# Patient Record
Sex: Male | Born: 1970 | Race: White | Hispanic: No | Marital: Single | Smoking: Current some day smoker
Health system: Southern US, Community
[De-identification: ages and names within clinical notes are randomized; demographics above are authoritative.]

## PROBLEM LIST (undated history)

## (undated) DIAGNOSIS — K909 Intestinal malabsorption, unspecified: Secondary | ICD-10-CM

## (undated) DIAGNOSIS — C801 Malignant (primary) neoplasm, unspecified: Secondary | ICD-10-CM

## (undated) DIAGNOSIS — C069 Malignant neoplasm of mouth, unspecified: Secondary | ICD-10-CM

## (undated) DIAGNOSIS — D509 Iron deficiency anemia, unspecified: Secondary | ICD-10-CM

## (undated) HISTORY — DX: Intestinal malabsorption, unspecified: K90.9

## (undated) HISTORY — PX: OTHER SURGICAL HISTORY: SHX169

## (undated) HISTORY — DX: Iron deficiency anemia, unspecified: D50.9

## (undated) HISTORY — PX: SP PERC PLACE GASTRIC TUBE: HXRAD333

---

## 2013-05-14 DIAGNOSIS — C801 Malignant (primary) neoplasm, unspecified: Secondary | ICD-10-CM

## 2013-05-14 HISTORY — DX: Malignant (primary) neoplasm, unspecified: C80.1

## 2014-08-04 ENCOUNTER — Encounter (HOSPITAL_COMMUNITY): Payer: Self-pay | Admitting: Emergency Medicine

## 2014-08-04 ENCOUNTER — Emergency Department (HOSPITAL_COMMUNITY)
Admission: EM | Admit: 2014-08-04 | Discharge: 2014-08-04 | Disposition: A | Discharge status: Home / Self Care | Payer: Self-pay | Attending: Emergency Medicine | Admitting: Emergency Medicine

## 2014-08-04 DIAGNOSIS — K088 Other specified disorders of teeth and supporting structures: Secondary | ICD-10-CM | POA: Insufficient documentation

## 2014-08-04 DIAGNOSIS — Z85819 Personal history of malignant neoplasm of unspecified site of lip, oral cavity, and pharynx: Secondary | ICD-10-CM

## 2014-08-04 DIAGNOSIS — G8929 Other chronic pain: Secondary | ICD-10-CM | POA: Insufficient documentation

## 2014-08-04 DIAGNOSIS — Z590 Homelessness: Secondary | ICD-10-CM | POA: Insufficient documentation

## 2014-08-04 DIAGNOSIS — Z720 Tobacco use: Secondary | ICD-10-CM | POA: Insufficient documentation

## 2014-08-04 DIAGNOSIS — K1379 Other lesions of oral mucosa: Secondary | ICD-10-CM | POA: Insufficient documentation

## 2014-08-04 DIAGNOSIS — Z8589 Personal history of malignant neoplasm of other organs and systems: Secondary | ICD-10-CM | POA: Insufficient documentation

## 2014-08-04 HISTORY — DX: Malignant (primary) neoplasm, unspecified: C80.1

## 2014-08-04 MED ORDER — LIDOCAINE VISCOUS 2 % MT SOLN: 20.0000 mL | OROMUCOSAL | Status: DC | PRN

## 2014-08-04 MED ORDER — OXYCODONE-ACETAMINOPHEN 5-325 MG PO TABS
2.0000 | ORAL_TABLET | Freq: Once | ORAL | Status: AC
  Administered 2014-08-04: 2 via ORAL
  Filled 2014-08-04: qty 2

## 2014-08-04 NOTE — ED Provider Notes (Signed)
CSN: [id number]     Arrival date & time 08/04/14  1429 History  This chart was scribed for Domenic Moras, PA-C working with Charlesetta Shanks, MD by Mercy Moore, ED Scribe. This patient was seen in room TR09C/TR09C and the patient's care was started at 4:10 PM.   Chief Complaint  Patient presents with  . Dental Pain    The history is provided by the patient. No language interpreter was used.  HPI Comments: Derek Campos is a 44 y.o. male who presents to the Emergency Department complaining of constant chronic oral ulcer and dental pain. 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 Patient reports that since radiation he has been experiencing dental and oral pain which his doctor warned him of. Patient reports exhausting his pain medication 07/15/2014: Oxycodone 10mg . Patient reports attempted treatment with ibuprofen since, but denies relief of his pain. Patient received his treatment in [place]. Patient shares that due to his bout with cancer he's become homeless. Patient expresses a desire to establish care here in [place]. Patient's other medication includes lodocaine viscous solution and Gengigel.  He is here for his chronic mouth pain.  No fever, or other infectious symptoms. He is a smoker.   Past Medical History  Diagnosis Date  . 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    Past Surgical History  Procedure Laterality Date  . J peg     No family history on file. History  Substance Use Topics  . Smoking status: Current Every Day Smoker  . Smokeless tobacco: Not on file  . Alcohol Use: Yes    Review of Systems  Constitutional: Negative for fever and chills.  HENT: Positive for dental problem and mouth sores. Negative for trouble swallowing.       Allergies  Review of patient's allergies indicates no known allergies.  Home Medications   Prior to Admission medications   Not on  File   Triage Vitals: BP 102/74 mmHg  Pulse 80  Temp(Src) 97.7 F (36.5 C) (Oral)  Resp 18  Ht 5\' 9"  (1.753 m)  Wt 125 lb (56.7 kg)  BMI 18.45 kg/m2  SpO2 97% Physical Exam  Constitutional: He is oriented to person, place, and time. He appears well-developed and well-nourished. No distress.  HENT:  Head: Normocephalic and atraumatic.  dystrophic appearing tongue with approximately 20% tissue loss to L proximal tongue.  crescent shaped ulcer to the lateral aspects on the right proximal tongue, ttp.  Generalized tenderness throughout all teeth without intrusion/extrusion. No trismus or obvious dental decay or abscess.   Eyes: EOM are normal.  Neck: Neck supple. No tracheal deviation present.  Cardiovascular: Normal rate, regular rhythm and normal heart sounds.   Pulmonary/Chest: Effort normal and breath sounds normal. No respiratory distress.  Abdominal: Soft. There is no tenderness.  Musculoskeletal: Normal range of motion.  Lymphadenopathy:    He has no cervical adenopathy.  Neurological: He is alert and oriented to person, place, and time.  Skin: Skin is warm and dry.  Psychiatric: He has a normal mood and affect. His behavior is normal.  Nursing note and vitals reviewed.   ED Course  Procedures (including critical care time)  COORDINATION OF CARE: 4:10 PM- Hx of mouth cancer and tongue cancer now with chronic mouth pain s/p radiation. No evidence of dental abscess or evidence of thrush. No other infectious finding. Pt will benefit from pain management  center and to f/u with oncologist.  Patient informed that long term narcotics can not be prescribed from the ED. Discussed treatment plan with patient at bedside and patient agreed to plan.   Labs Review Labs Reviewed - No data to display  Imaging Review No results found.   EKG Interpretation None      MDM   Final diagnoses:  History of mouth cancer  Mouth pain    BP 102/74 mmHg  Pulse 80  Temp(Src) 97.7 F  (36.5 C) (Oral)  Resp 18  Ht 5\' 9"  (1.753 m)  Wt 125 lb (56.7 kg)  BMI 18.45 kg/m2  SpO2 97%   I personally performed the services described in this documentation, which was scribed in my presence. The recorded information has been reviewed and is accurate.     Domenic Moras, PA-C 08/04/14 [place], PA-C 08/04/14 1651  Charlesetta Shanks, MD 08/04/14 2137

## 2014-08-04 NOTE — Discharge Instructions (Signed)
Please use resources below to find a primary care provider and eventually an oncologist for further management of your condition.     Emergency Department Resource Guide 1) Find a Doctor and Pay Out of Pocket Although you won't have to find out who is covered by your insurance plan, it is a good idea to ask around and get recommendations. You will then need to call the office and see if the doctor you have chosen will accept you as a new patient and what types of options they offer for patients who are self-pay. Some doctors offer discounts or will set up payment plans for their patients who do not have insurance, but you will need to ask so you aren't surprised when you get to your appointment.  2) Contact Your Local Health Department Not all health departments have doctors that can see patients for sick visits, but many do, so it is worth a call to see if yours does. If you don't know where your local health department is, you can check in your phone book. The CDC also has a tool to help you locate your state's health department, and many state websites also have listings of all of their local health departments.  3) Find a [facility] If your illness is not likely to be very severe or complicated, you may want to try a walk in clinic. These are popping up all over the country in pharmacies, drugstores, and shopping centers. They're usually staffed by nurse practitioners or physician assistants that have been trained to treat common illnesses and complaints. They're usually fairly quick and inexpensive. However, if you have serious medical issues or chronic medical problems, these are probably not your best option.  No Primary Care Doctor: - Call Health Connect at  [phone] - they can help you locate a primary care doctor that  accepts your insurance, provides certain services, etc. - Physician Referral Service- [phone]  Chronic Pain Problems: Organization         Address  Phone    Notes  [facility]  [phone] Patients need to be referred by their primary care doctor.   Medication Assistance: Organization         Address  Phone   Notes  [facility] Medication [facility] [address] [place], [place] [address] [phone] --Must be a resident of [facility] -- Must have NO insurance coverage whatsoever (no Medicaid/ Medicare, etc.) -- The pt. MUST have a primary care doctor that directs their care regularly and follows them in the community   MedAssist  [phone]   [employer]  [phone]    Agencies that provide inexpensive medical care: Organization         Address  Phone   Notes  [place]  [phone]   [facility]    [phone]   [facility] [place], [place] [address] [phone]   [place] [address], [place] [phone]   Planned Parenthood    [phone]   [facility]    [phone]   [place] and [place] [address], [place] Phone:  [phone], Fax:  [phone] Hours of Operation:  9 am - 6 pm, M-F.  Also accepts Medicaid/Medicare and self-pay.  [facility] for [place], [address], [place] Phone: [phone],  Fax: [phone]. Hours of Operation:  8:30 am - 5:30 pm, M-F.  Also accepts Medicaid and self-pay.  [facility] [address], [place] Phone: [phone]   [place], [place], [place] [phone], Ext. [phone] Mondays & Thursdays: 7-9 AM.  First 15 patients are seen on a first come, first serve basis.    [place] Providers:  Organization         Address  Phone   Notes  [facility] [address],  [phone] Also accepts self-pay patients.  [facility]  [address], [place]  [phone]   [place], [address], [place] [phone]   [facility] [address], [place] [phone]   Lucianne Lei [address], [place]   [phone] Only accepts [place] Access [place] patients after they have their name applied to their card.   Self-Pay (no insurance) in [facility]:  Organization         Address  Phone   Notes  Sickle Cell Patients, [facility] [place] [phone]   [facility] [place] [phone]   [facility] [place]  [place], [place], [place] [phone]   [facility]/Dr. Osei-Bonsu  [address], [place] or [address], [place] [phone] Phone number for both [place] and [place] locations is the same.  Urgent Medical and [facility] [address], [place] [phone]   [facility] [address] [place] or [address] [phone]   [facility] [address] [place] [phone], phone; [phone], fax Sees patients 1st and 3rd Saturday of every month.  Must not qualify for public or private insurance (i.e. Medicaid, Medicare, [place] Health Choice, Veterans' Benefits)  Household income should be no more than 200% of the poverty level The clinic cannot treat you if you are pregnant or think you are pregnant  Sexually transmitted diseases are not treated at the clinic.    Dental Care: Organization         Address  Phone  Notes  [facility] Department of [facility] [place] [phone] Accepts children up to age 22 who are enrolled in [place] or [place]; pregnant women with a Medicaid card; and children who have  applied for Medicaid or [place] Health Choice, but were declined, whose parents can pay a reduced fee at time of service.  [facility] Department of [facility]  [address], [place] [phone] Accepts children up to age 33 who are enrolled in [place] or [place]; pregnant women with a Medicaid card; and children who have applied for Medicaid or [place] Health Choice, but were declined, whose parents can pay a reduced fee at time of service.  [place] Adult Dental Access PROGRAM  [place] [phone] Patients are seen by appointment only. Walk-ins are not accepted. [place] will see patients 13 years of age and older. Monday - Tuesday (8am-5pm) Most Wednesdays (8:30-5pm) $30 per visit, cash only  [place] Adult [employer] PROGRAM  [address], [employer] (  [phone] Patients are seen by appointment only. Walk-ins are not accepted. [place] will see patients 46 years of age and older. One Wednesday Evening (Monthly: Volunteer Based).  $30 per visit, cash only  [place]  [phone] for adults; Children under age 38, call Graduate Pediatric Dentistry at [phone]. Children aged 28-14, please call [phone] to request a pediatric application.  Dental services are provided in all areas of dental care including fillings, crowns and bridges, complete and partial dentures, implants, gum treatment, root canals, and extractions. Preventive care is also provided. Treatment is provided to both adults and children. Patients are selected via a lottery and there is often a waiting list.   [facility] [address] [place]  [phone] [email or web address]   [facility] [address] [place], [place] [phone], Ext. [phone] Second and Fourth Thursday of each month, opens at 6:30 AM; Clinic ends at 9 AM.  Patients are seen on a first-come first-served basis, and a  limited number are seen during each clinic.   [facility]  [address] Hill[place] Danker [place], [place] [phone]   Eligibility Requirements You must have lived in [place], [place], or [place] counties for at least the last three months.   You cannot be eligible for state or federal sponsored [employer], including [employer], [place], or [employer].   You generally cannot be eligible for healthcare insurance through your employer.    How to apply: Eligibility screenings are held every Tuesday and Wednesday afternoon from 1:00 pm until 4:00 pm. You do not need an appointment for the interview!  [facility] [address], [place], [place]   [place]  [place] Department  [place]  [phone]    Behavioral Health Resources in the Community: Intensive Outpatient Programs Organization         Address  Phone  Notes  [place]. [address], [place], [place] [phone]   [facility] Outpatient [address] [place], [place]   ADS: Alcohol & Drug Svcs [address] [place], [place]   [place] [address],  [place], [place] or [phone]   Substance Abuse Resources Organization         Address  Phone  Notes  Alcohol and Drug Services  [phone]   [place]  [phone]   [facility] [place]   Chinita Pester  [phone]   Residential & Outpatient Substance Abuse Program  [phone]   Psychological Services Organization         Address  Phone  Notes  [facility]  [place]  [phone]   [place] [address] [place] or [phone]    Mobile Crisis Teams Organization          Address  Phone  Notes  [facility], Mobile Crisis Care Unit  [phone]   [facility]  [address] [place], [place]   [place] [address], [place] [phone]    Self-Help/Support Groups Organization         Address  Phone             Notes  [place]. of [place] - variety of support groups  336- H3156881 Call for more information  Narcotics Anonymous (NA), [facility] [address], [employer]  2 meetings at this location   Residential Facilities manager         Address  Phone  Notes  [facility] [place],    [place]  [phone]   [facility]  [address], [place] [id number], [place], [place]   [place], [place] [phone] Admissions: 8am-3pm M-F  Incentives Substance [place] [address]    [employer], [place] [phone]   [facility] [address] [place], [place], [place]   [facility] [address],  [place], [place]   [facility] Programs - Intensive Outpatient [address], [place], [place]   [facility] ([place].,  [place], [place] [phone]-([phone] or [phone]   [facility] Services (RTS) [address], [place], [place] Accepts Medicaid  [facility] [place] [address]  [place] [phone] Substance Abuse/Addiction Treatment   [facility] Organization         Address  Phone  Notes  [facility]  [phone]   Domenic Schwab, PhD [address] Arlis Porta [place], [place]   [phone] or [phone]   [place], [place] [phone]   [facility] [address], [place], [place] [phone] Insurance/Medicaid/sponsorship  through [facility] and Families [address]                                    [place], [place] [phone] [place] [address], [place] [phone]    Dr. Adele Schilder  [phone]   [facility] Dept. 1) [address] [place] 2) [place] 3)  [place] [address], [place] [phone]  [phone]   [place] [phone] or [phone] (After Hours)

## 2014-08-04 NOTE — ED Notes (Signed)
Pt is in stable condition upon d/c and ambulates escorted by staff from ED.

## 2014-08-04 NOTE — ED Notes (Signed)
Pt sts he was having radiation and chemo for cancer and now has ulcers in mouth that are extremely painful. Pt recently moved here and needs help with his medications.

## 2014-08-04 NOTE — ED Notes (Signed)
Debacterol placed on canker sore.

## 2014-08-11 ENCOUNTER — Ambulatory Visit: Payer: No Typology Code available for payment source | Attending: Family Medicine | Admitting: Family Medicine

## 2014-08-11 ENCOUNTER — Encounter: Payer: Self-pay | Admitting: Family Medicine

## 2014-08-11 VITALS — BP 117/78 | HR 78 | Temp 97.5°F | Resp 16 | Ht 69.0 in | Wt 131.4 lb

## 2014-08-11 DIAGNOSIS — F17200 Nicotine dependence, unspecified, uncomplicated: Secondary | ICD-10-CM

## 2014-08-11 DIAGNOSIS — G893 Neoplasm related pain (acute) (chronic): Secondary | ICD-10-CM | POA: Insufficient documentation

## 2014-08-11 DIAGNOSIS — Z808 Family history of malignant neoplasm of other organs or systems: Secondary | ICD-10-CM | POA: Insufficient documentation

## 2014-08-11 DIAGNOSIS — F17210 Nicotine dependence, cigarettes, uncomplicated: Secondary | ICD-10-CM | POA: Insufficient documentation

## 2014-08-11 DIAGNOSIS — Z9221 Personal history of antineoplastic chemotherapy: Secondary | ICD-10-CM | POA: Insufficient documentation

## 2014-08-11 DIAGNOSIS — Z85810 Personal history of malignant neoplasm of tongue: Secondary | ICD-10-CM | POA: Insufficient documentation

## 2014-08-11 DIAGNOSIS — K1379 Other lesions of oral mucosa: Secondary | ICD-10-CM | POA: Insufficient documentation

## 2014-08-11 DIAGNOSIS — C069 Malignant neoplasm of mouth, unspecified: Secondary | ICD-10-CM

## 2014-08-11 DIAGNOSIS — Z923 Personal history of irradiation: Secondary | ICD-10-CM | POA: Insufficient documentation

## 2014-08-11 DIAGNOSIS — Z720 Tobacco use: Secondary | ICD-10-CM

## 2014-08-11 DIAGNOSIS — Z931 Gastrostomy status: Secondary | ICD-10-CM

## 2014-08-11 DIAGNOSIS — G894 Chronic pain syndrome: Secondary | ICD-10-CM

## 2014-08-11 MED ORDER — LIDOCAINE VISCOUS 2 % MT SOLN: 20.0000 mL | OROMUCOSAL | Status: DC | PRN

## 2014-08-11 MED ORDER — ACETAMINOPHEN-CODEINE #3 300-30 MG PO TABS: 1.0000 | ORAL_TABLET | Freq: Three times a day (TID) | ORAL | Status: DC | PRN

## 2014-08-11 MED ORDER — NICOTINE 14 MG/24HR TD PT24: 14.0000 mg | MEDICATED_PATCH | Freq: Every day | TRANSDERMAL | Status: DC

## 2014-08-11 MED ORDER — GABAPENTIN 300 MG PO CAPS: 600.0000 mg | ORAL_CAPSULE | Freq: Two times a day (BID) | ORAL | Status: DC

## 2014-08-11 NOTE — Progress Notes (Signed)
Patient here to establish care Has a history of squamous cell carcinoma of tongue Patient has no pain medications and is in extreme pain Patient reports the only medication he has been taking is 3600 mg of ibuprofen per day

## 2014-08-11 NOTE — Progress Notes (Signed)
Subjective:    Patient ID: Derek Campos, male    DOB: 07-11-1970, 44 y.o.   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 He has a PEG tube and has run out of tube feeds as he has difficulty swallowing and so he has been feeding on Carnation and liquids. He complains of persistent pain in the mouth and drools a lot and has been unable to maintain a job. He does have a family history of oral cancer in his dad while he was in his middle 58s. Unfortunately he continues to smoke about 3-5 sticks of cigarettes per day and is thinking of quitting.  He brings in the form for disability which she would like completed to certify that he is unable to work.  Past Medical History  Diagnosis Date  . 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    . No Known Allergies  No current outpatient prescriptions on file prior to visit.   No current facility-administered medications on file prior to visit.     Review of Systems  General: negative for fever, weight loss, appetite change Eyes: no visual symptoms. ENT: see HPI Neck: no pain  Respiratory: no wheezing, shortness of breath, cough Cardiovascular: no chest pain, no dyspnea on exertion, no pedal edema, no orthopnea. Gastrointestinal: no abdominal pain, no diarrhea, no constipation Genito-Urinary: no urinary frequency, no dysuria, no polyuria. Hematologic: no bruising Endocrine: no cold or heat intolerance Neurological: no headaches, no seizures, no tremors Musculoskeletal: no joint pains, no joint swelling Skin: no pruritus, no rash. Psychological: no depression, no anxiety,    Objective:  Filed Vitals:   08/11/14 1352  BP: 117/78  Pulse: 78  Temp: 97.5 F (36.4 C)  Resp: 16       Physical Exam  Constitutional: normal appearing,  Eyes: PERRLA HENT: Head is atraumatic, oropharynx is in keeping with radiation changes evidenced by distorted tongue and healed scar on let of the tongue Neck: normal range of motion, no thyromegaly, no JVD Lymphatic: submandibular lymphadenopathy on the right, fixed 3x2 cm, on the left 2x2cm. Cardiovascular: normal rate and rhythm, normal heart sounds, no murmurs, rub or gallop, no pedal edema Respiratory: clear to auscultation bilaterally, no wheezes, no rales, no rhonchi Abdomen: PEG tube in situ, surrounding skin is normal, soft, not tender to palpation, normal bowel sounds, no enlarged organs Extremities: Full ROM, no tenderness in joints Skin: warm and dry, no lesions. Psychological: normal mood.         Assessment & Plan:  44 year old male patient with a history of oral cancer status post radiation and chemotherapy with consideration of a PEG tube who continues to have constant pain in his mouth and no oncology follow-up at this time.  Oral cancer: I am referring him to the [facility] and will also request his old records from his previous physicians. He has been given a prescription for opioid analgesic as well as gabapentin and viscous lidocaine. He does have a PEG tube but due to the fact that he has no insurance obtaining she will think to be a major challenge and I have gotten the social worker to speak with him regarding options available and how to source  for disability and/or Medicaid.  Chronic pain: This is secondary to oral cancer. He was previously on oxycodone which have informed him we do not do here but I have given him a temporary supply of Tylenol No. 3 hopefully he should be able to get some opioid analgesics at [place].  Tobacco abuse: I have strongly advised him to quit and have commands nicotine replacement therapy and nicotine patches Smoking cessation support: smoking cessation  hotline: 1-800-QUIT-NOW.  Smoking cessation classes are available through [facility] and Vascular Center. Call ([phone] or visit our website at [email or web address]  Spent 4 counseling on smoking cessations and patient is ready to quit.

## 2014-08-11 NOTE — Patient Instructions (Signed)
Smoking Cessation Quitting smoking is important to your health and has many advantages. However, it is not always easy to quit since nicotine is a very addictive drug. Oftentimes, people try 3 times or more before being able to quit. This document explains the best ways for you to prepare to quit smoking. Quitting takes hard work and a lot of effort, but you can do it. ADVANTAGES OF QUITTING SMOKING  You will live longer, feel better, and live better.  Your body will feel the impact of quitting smoking almost immediately.  Within 20 minutes, blood pressure decreases. Your pulse returns to its normal level.  After 8 hours, carbon monoxide levels in the blood return to normal. Your oxygen level increases.  After 24 hours, the chance of having a heart attack starts to decrease. Your breath, hair, and body stop smelling like smoke.  After 48 hours, damaged nerve endings begin to recover. Your sense of taste and smell improve.  After 72 hours, the body is virtually free of nicotine. Your bronchial tubes relax and breathing becomes easier.  After 2 to 12 weeks, lungs can hold more air. Exercise becomes easier and circulation improves.  The risk of having a heart attack, stroke, cancer, or lung disease is greatly reduced.  After 1 year, the risk of coronary heart disease is cut in half.  After 5 years, the risk of stroke falls to the same as a nonsmoker.  After 10 years, the risk of lung cancer is cut in half and the risk of other cancers decreases significantly.  After 15 years, the risk of coronary heart disease drops, usually to the level of a nonsmoker.  If you are pregnant, quitting smoking will improve your chances of having a healthy baby.  The people you live with, especially any children, will be healthier.  You will have extra money to spend on things other than cigarettes. QUESTIONS TO THINK ABOUT BEFORE ATTEMPTING TO QUIT You may want to talk about your answers with your  health care provider.  Why do you want to quit?  If you tried to quit in the past, what helped and what did not?  What will be the most difficult situations for you after you quit? How will you plan to handle them?  Who can help you through the tough times? Your family? Friends? A health care provider?  What pleasures do you get from smoking? What ways can you still get pleasure if you quit? Here are some questions to ask your health care provider:  How can you help me to be successful at quitting?  What medicine do you think would be best for me and how should I take it?  What should I do if I need more help?  What is smoking withdrawal like? How can I get information on withdrawal? GET READY  Set a quit date.  Change your environment by getting rid of all cigarettes, ashtrays, matches, and lighters in your home, car, or work. Do not let people smoke in your home.  Review your past attempts to quit. Think about what worked and what did not. GET SUPPORT AND ENCOURAGEMENT You have a better chance of being successful if you have help. You can get support in many ways.  Tell your family, friends, and coworkers that you are going to quit and need their support. Ask them not to smoke around you.  Get individual, group, or telephone counseling and support. Programs are available at local hospitals and health centers. Call   your local health department for information about programs in your area.  Spiritual beliefs and practices may help some smokers quit.  Download a "quit meter" on your computer to keep track of quit statistics, such as how long you have gone without smoking, cigarettes not smoked, and money saved.  Get a self-help book about quitting smoking and staying off tobacco. LEARN NEW SKILLS AND BEHAVIORS  Distract yourself from urges to smoke. Talk to someone, go for a walk, or occupy your time with a task.  Change your normal routine. Take a different route to work.  Drink tea instead of coffee. Eat breakfast in a different place.  Reduce your stress. Take a hot bath, exercise, or read a book.  Plan something enjoyable to do every day. Reward yourself for not smoking.  Explore interactive web-based programs that specialize in helping you quit. GET MEDICINE AND USE IT CORRECTLY Medicines can help you stop smoking and decrease the urge to smoke. Combining medicine with the above behavioral methods and support can greatly increase your chances of successfully quitting smoking.  Nicotine replacement therapy helps deliver nicotine to your body without the negative effects and risks of smoking. Nicotine replacement therapy includes nicotine gum, lozenges, inhalers, nasal sprays, and skin patches. Some may be available over-the-counter and others require a prescription.  Antidepressant medicine helps people abstain from smoking, but how this works is unknown. This medicine is available by prescription.  Nicotinic receptor partial agonist medicine simulates the effect of nicotine in your brain. This medicine is available by prescription. Ask your health care provider for advice about which medicines to use and how to use them based on your health history. Your health care provider will tell you what side effects to look out for if you choose to be on a medicine or therapy. Carefully read the information on the package. Do not use any other product containing nicotine while using a nicotine replacement product.  RELAPSE OR DIFFICULT SITUATIONS Most relapses occur within the first 3 months after quitting. Do not be discouraged if you start smoking again. Remember, most people try several times before finally quitting. You may have symptoms of withdrawal because your body is used to nicotine. You may crave cigarettes, be irritable, feel very hungry, cough often, get headaches, or have difficulty concentrating. The withdrawal symptoms are only temporary. They are strongest  when you first quit, but they will go away within 10-14 days. To reduce the chances of relapse, try to:  Avoid drinking alcohol. Drinking lowers your chances of successfully quitting.  Reduce the amount of caffeine you consume. Once you quit smoking, the amount of caffeine in your body increases and can give you symptoms, such as a rapid heartbeat, sweating, and anxiety.  Avoid smokers because they can make you want to smoke.  Do not let weight gain distract you. Many smokers will gain weight when they quit, usually less than 10 pounds. Eat a healthy diet and stay active. You can always lose the weight gained after you quit.  Find ways to improve your mood other than smoking. FOR MORE INFORMATION  www.smokefree.gov  Document Released: 04/24/2001 Document Revised: 09/14/2013 Document Reviewed: 08/09/2011 [employer] Patient Information 2015 [employer]. This information is not intended to replace advice given to you by your health care provider. Make sure you discuss any questions you have with your health care provider.  

## 2014-08-12 ENCOUNTER — Telehealth: Payer: Self-pay | Admitting: Hematology and Oncology

## 2014-08-12 NOTE — Telephone Encounter (Signed)
Called referring office regarding this patient's referral.  Referral coordinators are not in this week.  Left msg.    Dx: Oral Cancer Referring: Dr. Jarold Song  I also had Dr. Alvy Bimler review this referral and she states that she does not feel that this is an urgent referral.  Her first appt is not until 05/05 and she stated if they wanted him to be seen sooner he would have to see someone else.

## 2014-08-16 ENCOUNTER — Telehealth: Payer: Self-pay | Admitting: Hematology and Oncology

## 2014-08-16 ENCOUNTER — Other Ambulatory Visit: Payer: Self-pay | Admitting: Family Medicine

## 2014-08-16 ENCOUNTER — Telehealth: Payer: Self-pay | Admitting: Hematology

## 2014-08-16 NOTE — Telephone Encounter (Signed)
NEW PATIENT APPT-S/W PATIENT AND GAVE NP APPT 04/21 @ 10:30 W/DR. FENG.  REFERRAL WAS FORWARDED TO DR. Alvy Bimler APPT D/T WAS FOR 05/05 @ 1. CALLED PATIENT TO GIVE APPT PATIENT REQUESTED AM SOONER APPT. MEDICAL RECORDS HAVE BEEN REQUESTED FROM [place] [phone] DR. BOATWRIGHT

## 2014-08-16 NOTE — Telephone Encounter (Signed)
NEW PATIENT APPT-LEFT MESSAGE FOR PATIENT TO RETURN CALL TO SCHEDULE NP APPT

## 2014-08-20 ENCOUNTER — Encounter (HOSPITAL_COMMUNITY): Payer: Self-pay | Admitting: *Deleted

## 2014-08-20 ENCOUNTER — Emergency Department (HOSPITAL_COMMUNITY)
Admission: EM | Admit: 2014-08-20 | Discharge: 2014-08-20 | Disposition: A | Discharge status: Home / Self Care | Payer: Medicaid Other | Attending: Emergency Medicine | Admitting: Emergency Medicine

## 2014-08-20 DIAGNOSIS — M542 Cervicalgia: Secondary | ICD-10-CM | POA: Insufficient documentation

## 2014-08-20 DIAGNOSIS — K146 Glossodynia: Secondary | ICD-10-CM | POA: Insufficient documentation

## 2014-08-20 DIAGNOSIS — K1370 Unspecified lesions of oral mucosa: Secondary | ICD-10-CM | POA: Insufficient documentation

## 2014-08-20 DIAGNOSIS — Z720 Tobacco use: Secondary | ICD-10-CM | POA: Insufficient documentation

## 2014-08-20 DIAGNOSIS — Z85828 Personal history of other malignant neoplasm of skin: Secondary | ICD-10-CM | POA: Insufficient documentation

## 2014-08-20 DIAGNOSIS — Z8589 Personal history of malignant neoplasm of other organs and systems: Secondary | ICD-10-CM

## 2014-08-20 DIAGNOSIS — Z79899 Other long term (current) drug therapy: Secondary | ICD-10-CM | POA: Insufficient documentation

## 2014-08-20 MED ORDER — LIDOCAINE VISCOUS 2 % MT SOLN
15.0000 mL | Freq: Once | OROMUCOSAL | Status: AC
  Administered 2014-08-20: 15 mL via OROMUCOSAL
  Filled 2014-08-20: qty 15

## 2014-08-20 MED ORDER — OXYCODONE-ACETAMINOPHEN 5-325 MG PO TABS: 1.0000 | ORAL_TABLET | Freq: Four times a day (QID) | ORAL | Status: DC | PRN

## 2014-08-20 MED ORDER — MAGIC MOUTHWASH
5.0000 mL | Freq: Once | ORAL | Status: AC
  Administered 2014-08-20: 5 mL via ORAL
  Filled 2014-08-20: qty 5

## 2014-08-20 MED ORDER — MAGIC MOUTHWASH W/LIDOCAINE: 10.0000 mL | Freq: Three times a day (TID) | ORAL | Status: DC

## 2014-08-20 NOTE — ED Provider Notes (Signed)
CSN: [id number]     Arrival date & time 08/20/14  1649 History   First MD Initiated Contact with Patient 08/20/14 1802     Chief Complaint  Patient presents with  . Mouth Lesions     (Consider location/radiation/quality/duration/timing/severity/associated sxs/prior Treatment) HPI Comments: Derek Campos is a 44 y.o. 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 He reports the pain is 10/10 constant sharp pain radiating around his mouth, worse with touching and eating, improved with ice cream and room temperature liquids, and unrelieved with Tylenol 3 and Orajel. He was previously on OxyContin 10 mg and 600 mg of gabapentin but since moving here from [facility] he has not had these refilled. He reports associated neck pain on the underside of his right jaw. He states he feels that this is a recurrence of his oral cancer. He has an appointment with Dr. Burr Medico of oncology on 09/02/14. He denies any fevers, chills, chest pain, shortness breath, abdominal pain, nausea, vomiting, diarrhea, constipation, melena, hematochezia, dysuria, hematuria, numbness, tingling, weakness, dental pain, oral drainage, gum swelling, trismus, or sore throat.  Patient is a 44 y.o. male presenting with mouth sores. The history is provided by the patient. No language interpreter was used.  Mouth Lesions Location:  Tongue Quality:  White Onset quality:  Gradual Severity:  Severe Duration:  2 weeks Progression:  Worsening Chronicity:  Recurrent Relieved by: room temperature liquids. Worsened by:  Eating and drinking Ineffective treatments:  Topical medications and prescription drugs (orajel and tylenol #3) Associated symptoms: neck pain   Associated symptoms: no dental pain, no ear pain, no fever, no rash  and no sore throat     Past Medical History  Diagnosis Date  . Cancer     squamous cell carcinoma on tongue   Past Surgical History  Procedure Laterality Date  . J peg     Family History  Problem Relation Age of Onset  . Cancer Father   . Alcohol abuse Father    History  Substance Use Topics  . Smoking status: Current Every Day Smoker -- 0.25 packs/day  . Smokeless tobacco: Not on file  . Alcohol Use: No    Review of Systems  Constitutional: Negative for fever and chills.  HENT: Positive for mouth sores. Negative for dental problem, ear pain, facial swelling, sore throat and trouble swallowing.   Respiratory: Negative for shortness of breath.   Cardiovascular: Negative for chest pain.  Gastrointestinal: Negative for nausea, vomiting, abdominal pain, diarrhea, constipation and blood in stool.  Genitourinary: Negative for dysuria and hematuria.  Musculoskeletal: Positive for neck pain. Negative for myalgias and arthralgias.  Skin: Negative for rash.  Neurological: Negative for weakness and numbness.  Psychiatric/Behavioral: Negative for confusion.   10 Systems reviewed and are negative for acute change except as noted in the HPI.    Allergies  Review of patient's allergies indicates no known allergies.  Home Medications   Prior to Admission medications   Medication Sig Start Date End Date Taking? Authorizing Provider  acetaminophen-codeine (TYLENOL #3) 300-30 MG per tablet Take 1 tablet by mouth every 8 (eight) hours as needed for moderate pain. 08/11/14   Arnoldo Morale, MD  gabapentin (NEURONTIN) 300 MG capsule Take 2 capsules (600 mg total) by mouth 2 (two) times  daily. 08/11/14   Arnoldo Morale, MD  ibuprofen (ADVIL,MOTRIN) 200 MG tablet Take 200 mg by mouth every 6 (six) hours as needed.    Historical Provider, MD  lidocaine (XYLOCAINE) 2 % solution Use as directed 20 mLs in the mouth or throat as needed for mouth pain. 08/11/14   Arnoldo Morale, MD  nicotine (NICODERM CQ  - DOSED IN MG/24 HOURS) 14 mg/24hr patch Place 1 patch (14 mg total) onto the skin daily. For 6 weeks, then apply 7mg /24 hr for 2 weeks 08/11/14   Arnoldo Morale, MD   BP 107/68 mmHg  Pulse 87  Temp(Src) 98 F (36.7 C) (Axillary)  Resp 20  Ht 5\' 9"  (1.753 m)  Wt 136 lb (61.689 kg)  BMI 20.07 kg/m2  SpO2 100%   Physical Exam  Constitutional: He is oriented to person, place, and time. Vital signs are normal. He appears well-developed and well-nourished.  Non-toxic appearance. No distress.  Afebrile, nontoxic, NAD  HENT:  Head: Normocephalic and atraumatic.  Mouth/Throat: Uvula is midline, oropharynx is clear and moist and mucous membranes are normal. Oral lesions present. No trismus in the jaw. Normal dentition. No dental abscesses, uvula swelling or dental caries.    Dystrophic tongue with ~1/3 of L lateral tongue resected, with entire lateral portion of remaining tongue ulcerated with a whiteish film which is exquisitely TTP, no bleeding, unable to scrape off any films. No dentitial tenderness or abnormalities to gum line. No trismus or uvular swelling. No dental abscess, airway patent  Eyes: Conjunctivae and EOM are normal. Right eye exhibits no discharge. Left eye exhibits no discharge.  Neck: Normal range of motion. Neck supple.  Cardiovascular: Normal rate, regular rhythm, normal heart sounds and intact distal pulses.  Exam reveals no gallop and no friction rub.   No murmur heard. Pulmonary/Chest: Effort normal and breath sounds normal. No respiratory distress. He has no decreased breath sounds. He has no wheezes. He has no rhonchi. He has no rales.  Abdominal: Soft. Normal appearance and bowel sounds are normal. He exhibits no distension. There is no tenderness. There is no rigidity, no rebound, no guarding, no CVA tenderness, no tenderness at McBurney's point and negative Murphy's sign.  Musculoskeletal: Normal range of motion.  Lymphadenopathy:       Head (right side): Submandibular  adenopathy present. No submental and no tonsillar adenopathy present.       Head (left side): No submental, no submandibular and no tonsillar adenopathy present.    He has no cervical adenopathy.  One lymph node enlarged at R submandibular region, soft, tender to palpation, approx 1.5cm in diameter, mobile. No other head/neck LAD  Neurological: He is alert and oriented to person, place, and time. He has normal strength. No sensory deficit.  Skin: Skin is warm, dry and intact. No rash noted.  Psychiatric: He has a normal mood and affect.  Nursing note and vitals reviewed.   ED Course  Procedures (including critical care time) Labs Review Labs Reviewed - No data to display  Imaging Review No results found.   EKG Interpretation None      MDM   Final diagnoses:  Oral lesion  History of squamous cell carcinoma  Tongue pain    44 y.o. male here with oral lesion to L lateral tongue, worsened since last ER visit on 08/04/14. No dental pain, does not appear to be thrush. Has an appt on 4/21 with oncologist Dr. Burr Medico. Reports in the past his pain has been relieved with viscous  lidocaine, as well as chronic narcotics which he hasn't had refilled since moving here from [facility]. Will give magic mouthwash and viscous lidocaine here. Will send him home with magic mouthwash and small supply of oral narcotics given the extent of his oral lesion, as well as referral to ENT. Discussed pt with Dr. Reather Converse, who saw pt as well and would like oncology to be notified of the worsening in order to get his appointment moved up. Will consult them now.    8:06 PM Consult has not yet called back. Will send with epic message that pt needs to have sooner appointment. Will discharge pt now, with instructions to call the oncologist on Monday to schedule an appt that's sooner than 4/21. I explained the diagnosis and have given explicit precautions to return to the ER including for any other new or worsening symptoms. The  patient understands and accepts the medical plan as it's been dictated and I have answered their questions. Discharge instructions concerning home care and prescriptions have been given. The patient is STABLE and is discharged to home in good condition.  BP 107/68 mmHg  Pulse 87  Temp(Src) 98 F (36.7 C) (Axillary)  Resp 20  Ht 5\' 9"  (1.753 m)  Wt 136 lb (61.689 kg)  BMI 20.07 kg/m2  SpO2 100%  Meds ordered this encounter  Medications  . magic mouthwash    Sig:   . lidocaine (XYLOCAINE) 2 % viscous mouth solution 15 mL    Sig:   . Alum & Mag Hydroxide-Simeth (MAGIC MOUTHWASH W/LIDOCAINE) SOLN    Sig: Take 10 mLs by mouth 3 (three) times daily.    Dispense:  120 mL    Refill:  0    Order Specific Question:  Supervising Provider    Answer:  MILLER, BRIAN [[id number]]  . oxyCODONE-acetaminophen (PERCOCET) 5-325 MG per tablet    Sig: Take 1 tablet by mouth every 6 (six) hours as needed for severe pain.    Dispense:  6 tablet    Refill:  0    Order Specific Question:  Supervising Provider    Answer:  Noemi Chapel [[id number]]     Guerline Happ Camprubi-Soms, PA-C 08/20/14 2007  Elnora Morrison, MD 08/21/14 [phone]

## 2014-08-20 NOTE — Discharge Instructions (Signed)
Use magic mouthwash as directed, as needed for pain. Use percocet as directed as needed for pain, but don't drive while taking this medication. Follow up with ENT doctor next week, and with oncologist as soon as possible. Return to the ER for changes or worsening symptoms.

## 2014-08-20 NOTE — ED Notes (Signed)
The pt has had cancer  Of the mouth and he finished his chemo and radiation in January .  He moved from [place].  For the past 3 days he has had more lesions new  In huis mouth with pain

## 2014-08-23 ENCOUNTER — Telehealth: Payer: Self-pay | Admitting: Hematology

## 2014-08-23 NOTE — Telephone Encounter (Signed)
NEW PATIENT APPT-LEFT MESSAGE FOR PATIENT AND GAVE NP APPT FOR 04/12 @ 2:30 W/DR. FENG. PATIENT CALLED TO CONFIRM APPT.

## 2014-08-24 ENCOUNTER — Ambulatory Visit: Payer: Self-pay | Admitting: Hematology

## 2014-08-24 ENCOUNTER — Ambulatory Visit: Payer: Self-pay

## 2014-08-25 ENCOUNTER — Ambulatory Visit: Payer: Self-pay | Admitting: Hematology

## 2014-08-25 ENCOUNTER — Ambulatory Visit: Payer: Self-pay

## 2014-08-25 ENCOUNTER — Other Ambulatory Visit: Payer: Self-pay | Admitting: Family

## 2014-08-25 DIAGNOSIS — C069 Malignant neoplasm of mouth, unspecified: Secondary | ICD-10-CM

## 2014-08-26 ENCOUNTER — Other Ambulatory Visit: Payer: Self-pay | Admitting: Family

## 2014-08-26 ENCOUNTER — Encounter: Payer: Self-pay | Admitting: Family

## 2014-08-26 ENCOUNTER — Ambulatory Visit (HOSPITAL_BASED_OUTPATIENT_CLINIC_OR_DEPARTMENT_OTHER): Payer: No Typology Code available for payment source

## 2014-08-26 ENCOUNTER — Other Ambulatory Visit (HOSPITAL_BASED_OUTPATIENT_CLINIC_OR_DEPARTMENT_OTHER): Payer: No Typology Code available for payment source

## 2014-08-26 ENCOUNTER — Ambulatory Visit: Payer: No Typology Code available for payment source

## 2014-08-26 ENCOUNTER — Telehealth: Payer: Self-pay | Admitting: Hematology & Oncology

## 2014-08-26 ENCOUNTER — Ambulatory Visit (HOSPITAL_BASED_OUTPATIENT_CLINIC_OR_DEPARTMENT_OTHER): Payer: No Typology Code available for payment source | Admitting: Family

## 2014-08-26 VITALS — BP 131/90 | HR 76 | Temp 97.4°F | Resp 18 | Ht 69.0 in | Wt 128.0 lb

## 2014-08-26 DIAGNOSIS — Z720 Tobacco use: Secondary | ICD-10-CM

## 2014-08-26 DIAGNOSIS — C069 Malignant neoplasm of mouth, unspecified: Secondary | ICD-10-CM

## 2014-08-26 DIAGNOSIS — K1379 Other lesions of oral mucosa: Secondary | ICD-10-CM

## 2014-08-26 DIAGNOSIS — Z85818 Personal history of malignant neoplasm of other sites of lip, oral cavity, and pharynx: Secondary | ICD-10-CM

## 2014-08-26 LAB — CMP (CANCER CENTER ONLY)
ALT: 28 U/L (ref 10–47)
AST: 33 U/L (ref 11–38)
Albumin: 4 g/dL (ref 3.3–5.5)
Alkaline Phosphatase: 69 U/L (ref 26–84)
BILIRUBIN TOTAL: 0.5 mg/dL (ref 0.20–1.60)
BUN, Bld: 17 mg/dL (ref 7–22)
CALCIUM: 10.4 mg/dL — AB (ref 8.0–10.3)
CHLORIDE: 98 meq/L (ref 98–108)
CO2: 33 meq/L (ref 18–33)
CREATININE: 0.8 mg/dL (ref 0.6–1.2)
Glucose, Bld: 66 mg/dL — ABNORMAL LOW (ref 73–118)
Potassium: 4 mEq/L (ref 3.3–4.7)
Sodium: 142 mEq/L (ref 128–145)
Total Protein: 8.3 g/dL — ABNORMAL HIGH (ref 6.4–8.1)

## 2014-08-26 LAB — CBC WITH DIFFERENTIAL (CANCER CENTER ONLY)
BASO#: 0 10*3/uL (ref 0.0–0.2)
BASO%: 0.4 % (ref 0.0–2.0)
EOS ABS: 0.1 10*3/uL (ref 0.0–0.5)
EOS%: 1.3 % (ref 0.0–7.0)
HCT: 34.4 % — ABNORMAL LOW (ref 38.7–49.9)
HGB: 11.8 g/dL — ABNORMAL LOW (ref 13.0–17.1)
LYMPH#: 0.4 10*3/uL — ABNORMAL LOW (ref 0.9–3.3)
LYMPH%: 5.1 % — ABNORMAL LOW (ref 14.0–48.0)
MCH: 32.9 pg (ref 28.0–33.4)
MCHC: 34.3 g/dL (ref 32.0–35.9)
MCV: 96 fL (ref 82–98)
MONO#: 0.7 10*3/uL (ref 0.1–0.9)
MONO%: 8.9 % (ref 0.0–13.0)
NEUT%: 84.3 % — ABNORMAL HIGH (ref 40.0–80.0)
NEUTROS ABS: 6.9 10*3/uL — AB (ref 1.5–6.5)
PLATELETS: 339 10*3/uL (ref 145–400)
RBC: 3.59 10*6/uL — AB (ref 4.20–5.70)
RDW: 12 % (ref 11.1–15.7)
WBC: 8.2 10*3/uL (ref 4.0–10.0)

## 2014-08-26 MED ORDER — MORPHINE SULFATE (CONCENTRATE) 10 MG /0.5 ML PO SOLN: 10.0000 mg | ORAL | Status: DC | PRN

## 2014-08-26 MED ORDER — MORPHINE SULFATE 4 MG/ML IJ SOLN
2.0000 mg | Freq: Once | INTRAMUSCULAR | Status: AC
  Administered 2014-08-26: 2 mg via SUBCUTANEOUS

## 2014-08-26 MED ORDER — MORPHINE SULFATE 4 MG/ML IJ SOLN
INTRAMUSCULAR | Status: AC
  Filled 2014-08-26: qty 1

## 2014-08-26 MED ORDER — OXYCODONE HCL 20 MG/ML PO CONC: 10.0000 mg | ORAL | Status: DC | PRN

## 2014-08-26 MED ORDER — FENTANYL 25 MCG/HR TD PT72: 25.0000 ug | MEDICATED_PATCH | TRANSDERMAL | Status: DC

## 2014-08-26 NOTE — Telephone Encounter (Signed)
I spoke w NEW PATIENT today to remind them of their appointment with Dr. Marin Olp. Also, advised them to bring all medication bottles and insurance card information.  However, pt advise that [place] at this time.

## 2014-08-26 NOTE — Progress Notes (Signed)
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 He states that his biopsy in October revealed squamous cell carcinoma of the tongue. We will clarify what stage once we get his records. He was treated with 6 weeks of radiation and chemo at [facility] in [place], [place]. He completed this in January 2016. He moved back to [place] in March of this years and 2 weeks after his move he developed another large lesion along the underside of the left side of his tongue. This is very painful for him and he has copious amounts of secretions. This makes it difficult for him to talk.  He has a J-tube and has been putting Carnation instant breakfast down it daily for feedings. He is unable to swallow due to pain.  We have faxed an information release to [facility] and are awaiting all his previous reports and scans. His radiation oncologist was Dr. Lelon Huh and his oncologist was Dr. Francesco Sor.  He states that his father had this same type of cancer and passed away from it.  He is a smoker but is now down to 2 cigarettes a day. No alcohol.  He has no children. No other personal or familial history of cancer.  As mentioned before he is in a great deal of pain and was finding it difficult to talk. He did not drive himself today so we did give him Morphine 2 mg SQ which helped.  He denies fever, chills, n/v, cough, rash, dizziness, headache, chest pain, palpitations, abdominal pain, constipation, diarrhea, blood in urine or stool. No lymphadenopathy. He has SOB due to the pain at times. No swelling, tenderness,  numbness or tingling in her extremities. His oral liquid intake has been limited. He denies any weight loss.   ROS: All other 10 point review of systems is negative.   PAST MEDICAL HISTORY:   Past Medical History  Diagnosis Date  . Cancer     squamous cell carcinoma on tongue    ALLERGIES: Allergies  Allergen Reactions  . Other     Cat Gut Stitches - unknown reaction.Told by parents.      MEDICATIONS:  Current Outpatient Prescriptions on File Prior to Visit  Medication Sig Dispense Refill  . acetaminophen-codeine (TYLENOL #3) 300-30 MG per tablet Take 1 tablet by mouth every 8 (eight) hours as needed for moderate pain. 60 tablet 1  . Alum & Mag Hydroxide-Simeth (MAGIC MOUTHWASH W/LIDOCAINE) SOLN Take 10 mLs by mouth 3 (three) times daily. 120 mL 0  . gabapentin (NEURONTIN) 300 MG capsule Take 2 capsules (600 mg total) by mouth 2 (two) times daily. (Patient taking differently: Take 600 mg by mouth 3 (three) times daily. ) 120 capsule 3  . ibuprofen (ADVIL,MOTRIN) 200 MG tablet Take 200 mg by mouth every 6 (six) hours as needed.    . lidocaine (XYLOCAINE) 2 % solution USE 20 MLS IN THE MOUTH OR THROAT AS NEEDED FOR MOUTH PAIN. 100 mL 1  . nicotine (NICODERM CQ - DOSED IN MG/24 HOURS) 14 mg/24hr patch Place 1 patch (14 mg total) onto  the skin daily. For 6 weeks, then apply 7mg /24 hr for 2 weeks 56 patch 0  . oxyCODONE-acetaminophen (PERCOCET) 5-325 MG per tablet Take 1 tablet by mouth every 6 (six) hours as needed for severe pain. (Patient not taking: Reported on 08/26/2014) 6 tablet 0   No current facility-administered medications on file prior to visit.     PAST SURGICAL HISTORY Past Surgical History  Procedure Laterality Date  . J peg      FAMILY HISTORY: Family History  Problem Relation Age of Onset  . Cancer Father   . Alcohol abuse Father     SOCIAL HISTORY:  reports that he has been smoking.  He does not have any smokeless tobacco history on file. He reports that he  does not drink alcohol or use illicit drugs.  PERFORMANCE STATUS: The patient's performance status is 1 - Symptomatic but completely ambulatory  PHYSICAL EXAM: Most Recent Vital Signs: Blood pressure 131/90, pulse 76, temperature 97.4 F (36.3 C), temperature source Axillary, resp. rate 18, height 5\' 9"  (1.753 m), weight 128 lb (58.06 kg). BP 131/90 mmHg  Pulse 76  Temp(Src) 97.4 F (36.3 C) (Axillary)  Resp 18  Ht 5\' 9"  (1.753 m)  Wt 128 lb (58.06 kg)  BMI 18.89 kg/m2  General Appearance:    Alert, cooperative, no distress, appears stated age  Head:    Normocephalic, without obvious abnormality, atraumatic  Eyes:    PERRL, conjunctiva/corneas clear, EOM's intact, fundi    benign, both eyes             Throat:   Lips normal, has lesion along under portion of left tongue, his inner cheek on that side is also swollen; teeth and gums normal  Neck:   Supple, symmetrical, trachea midline, no adenopathy;       thyroid:  No enlargement/tenderness/nodules; no carotid   bruit or JVD  Back:     Symmetric, no curvature, ROM normal, no CVA tenderness  Lungs:     Clear to auscultation bilaterally, respirations unlabored  Chest wall:    No tenderness or deformity  Heart:    Regular rate and rhythm, S1 and S2 normal, no murmur, rub   or gallop  Abdomen:     Soft, non-tender, bowel sounds active all four quadrants,    no masses, no organomegaly        Extremities:   Extremities normal, atraumatic, no cyanosis or edema  Pulses:   2+ and symmetric all extremities  Skin:   Skin color, texture, turgor normal, no rashes or lesions  Lymph nodes:   Cervical, supraclavicular, and axillary nodes normal  Neurologic:   CNII-XII intact. Normal strength, sensation and reflexes      throughout   LABORATORY DATA:  Results for orders placed or performed in visit on 08/26/14 (from the past 48 hour(s))  CBC with Differential [facility])     Status: Abnormal   Collection Time: 08/26/14 11:50 AM   Result Value Ref Range   WBC 8.2 4.0 - 10.0 10e3/uL   RBC 3.59 (L) 4.20 - 5.70 10e6/uL   HGB 11.8 (L) 13.0 - 17.1 g/dL   HCT 34.4 (L) 38.7 - 49.9 %   MCV 96 82 - 98 fL   MCH 32.9 28.0 - 33.4 pg   MCHC 34.3 32.0 - 35.9 g/dL   RDW 12.0 11.1 - 15.7 %   Platelets 339 145 - 400 10e3/uL   NEUT# 6.9 (H) 1.5 - 6.5 10e3/uL   LYMPH# 0.4 (L)  0.9 - 3.3 10e3/uL   MONO# 0.7 0.1 - 0.9 10e3/uL   Eosinophils Absolute 0.1 0.0 - 0.5 10e3/uL   BASO# 0.0 0.0 - 0.2 10e3/uL   NEUT% 84.3 (H) 40.0 - 80.0 %   LYMPH% 5.1 (L) 14.0 - 48.0 %   MONO% 8.9 0.0 - 13.0 %   EOS% 1.3 0.0 - 7.0 %   BASO% 0.4 0.0 - 2.0 %  COMPREHENSIVE METABOLIC PANEL ([facility] REFLEX ONLY)     Status: Abnormal   Collection Time: 08/26/14 11:50 AM  Result Value Ref Range   Sodium 142 128 - 145 mEq/L   Potassium 4.0 3.3 - 4.7 mEq/L   Chloride 98 98 - 108 mEq/L   CO2 33 18 - 33 mEq/L   Glucose, Bld 66 (L) 73 - 118 mg/dL   BUN, Bld 17 7 - 22 mg/dL   Creat 0.8 0.6 - 1.2 mg/dl   Total Bilirubin 0.50 0.20 - 1.60 mg/dl   Alkaline Phosphatase 69 26 - 84 U/L   AST 33 11 - 38 U/L   ALT(SGPT) 28 10 - 47 U/L   Total Protein 8.3 (H) 6.4 - 8.1 g/dL   Albumin 4.0 3.3 - 5.5 g/dL   Calcium 10.4 (H) 8.0 - 10.3 mg/dL      RADIOGRAPHY: No results found.     PATHOLOGY: Awaiting reports from [facility] in [place].   ASSESSMENT/PLAN: Mr. Hemmelgarn if a pleasant 44 yo white male with a recent history of oral cancer. He finished treatment for this in January 2016. Now he has a new painful lesion the left underside of his tongue.  We have requested all his records from his old cancer center.  We will also get an MRI of the face and a PET scan.   We gave him Morphine 2 mg SQ while he was here for pain.  We will have him start using a Duragesic patch and also gave him a prescription for Roxanol 0.5 cc Q4H PRN. We will see what his scans show and then schedule his follow-up. He will likely need referred to [facility] for oral  surgery.   All questions were answered. He knows to call the clinic with any problems, questions or concerns. We can certainly see him much sooner if necessary.  The patient was discussed with and also seen by Dr. Marin Olp and he is in agreement with the aforementioned.   [facility]    Addendum:   I saw and examined the patient with Aaleyah Witherow. It does not Lowell Guitar he had stage IV disease. He may have had T4 disease. I do not see any radiation scars on his neck.   I have to suspect that he probably has recurrences by the appearance on physical exam.  He is in quite a bit of pain. We will see about getting him on a Duragesic patch and also some Roxanol elixir. I  Think that these will be appropriate.   He is still smoking.   We will have to see what the PET scan and MRI show.   It would be nice to try to get records from [place].   If we do find recurrence , I think that the best treatment for him would be surgical excision. thankfully , we have some world famous ENT surgeons at [facility] that I would be the best for him if surgery was contemplated.    he is very nice. I would just be surprised that he would have recurrence so  quickly after finishing treatments.   We will see what his scans show.

## 2014-08-26 NOTE — Patient Instructions (Signed)
Morphine injection solution What is this medicine? MORPHINE (MOR feen) is a pain reliever. It is used to treat moderate to severe pain. This medicine may be used for other purposes; ask your health care provider or pharmacist if you have questions. COMMON BRAND NAME(S): Astramorph PF, Duramorph, Duramorph PF, Infumorph What should I tell my health care provider before I take this medicine? They need to know if you have any of these conditions: -brain tumor -drug abuse or addiction -head injury -heart disease -frequently drink alcohol containing drinks -intestinal disease -kidney disease or problems urinating -kyphoscoliosis -liver disease -lung or breathing disease, like asthma -seizures -taken an MAOI like Carbex, Eldepryl, Marplan, Nardil, or Parnate in last 14 days -an unusual or allergic reaction to morphine, other pain medicines, foods, dyes, or preservatives -pregnant or trying to get pregnant -breast-feeding How should I use this medicine? This medicine is for injection into a muscle, vein, or under the skin. It is usually given by a health care professional in a hospital or clinic setting. If you get this medicine at home, you will be taught how to prepare and give this medicine. Use exactly as directed. Take your medicine at regular intervals. Do not take your medicine more often than directed. Always look at your medicine before using it. Do not use the injection if its color is darker than pale yellow or if it is discolored in any other way. Do not use this medicine if it is cloudy, thickened, colored, or has solid particles in it. It is important that you put your used needles and syringes in a special sharps container. Do not put them in a trash can. If you do not have a sharps container, call your pharmacist or healthcare provider to get one. Talk to your pediatrician regarding the use of this medicine in children. Special care may be needed. Overdosage: If you think you  have taken too much of this medicine contact a poison control center or emergency room at once. NOTE: This medicine is only for you. Do not share this medicine with others. What if I miss a dose? If you miss a dose, take it as soon as you can. If it is almost time for your next dose, take only that dose. Do not take double or extra doses. What may interact with this medicine? Do not take this medicine with any of the following medications: -MAOIs like Carbex, Eldepryl, Marplan, Nardil, and Parnate This medicine may also interact with the following medications: -alcohol -antihistamines -barbiturates, like phenobarbital -medicines for depression, anxiety, or psychotic disturbances -medicines for sleep -muscle relaxants -naltrexone, naloxone -narcotic medicines (opiates) for pain -rifampin -tramadol This list may not describe all possible interactions. Give your health care provider a list of all the medicines, herbs, non-prescription drugs, or dietary supplements you use. Also tell them if you smoke, drink alcohol, or use illegal drugs. Some items may interact with your medicine. What should I watch for while using this medicine? Tell your doctor or health care professional if your pain does not go away, if it gets worse, or if you have new or a different type of pain. You may develop tolerance to the medicine. Tolerance means that you will need a higher dose of the medicine for pain relief. Tolerance is normal and is expected if you take this medicine for a long time. Do not suddenly stop taking your medicine because you may develop a severe reaction. Your body becomes used to the medicine. This does NOT mean you  are addicted. Addiction is a behavior related to getting and using a drug for a non-medical reason. If you have pain, you have a medical reason to take pain medicine. Your doctor will tell you how much medicine to take. If your doctor wants you to stop the medicine, the dose will be  slowly lowered over time to avoid any side effects. You may get drowsy or dizzy. Do not drive, use machinery, or do anything that needs mental alertness until you know how this medicine affects you. Do not stand or sit up quickly, especially if you are an older patient. This reduces the risk of dizzy or fainting spells. Alcohol may interfere with the effect of this medicine. Avoid alcoholic drinks. There are different types of narcotic medicines (opiates) for pain. If you take more than one type at the same time, you may have more side effects. Give your health care provider a list of all medicines you use. Your doctor will tell you how much medicine to take. Do not take more medicine than directed. Call emergency for help if you have problems breathing. This medicine will cause constipation. Try to have a bowel movement at least every 2 to 3 days. If you do not have a bowel movement for 3 days, call your doctor or health care professional. Your mouth may get dry. Drinking water, chewing sugarless gum, or sucking on hard candy may help. See your dentist every 6 months. What side effects may I notice from receiving this medicine? Side effects that you should report to your doctor or health care professional as soon as possible: -allergic reactions like skin rash, itching or hives, swelling of the face, lips, or tongue -breathing problems -change in the amount of urine -confusion -feeling faint or lightheaded -fever, chills -hallucinations -red or sore at the injection site -seizures -slow or fast heartbeat -unusually weak or tired Side effects that usually do not require medical attention (report to your doctor or health care professional if they continue or are bothersome): -constipation -dizziness -headache -nausea, vomiting -pinpoint pupils -sweating This list may not describe all possible side effects. Call your doctor for medical advice about side effects. You may report side effects to  FDA at 1-800-FDA-1088. Where should I keep my medicine? Keep out of the reach of children. This medicine can be abused. Keep it in a safe place to protect it from theft. Do not share this medicine with anyone. Selling or giving away this medicine is dangerous and is against the law. If you are using this medicine at home, you will be instructed on how to store this medicine. Throw away any unused medicine after the expiration date on the label. Discard unused medicine and used packaging carefully. Pets and children can be harmed if they find used or lost packages. NOTE: This sheet is a summary. It may not cover all possible information. If you have questions about this medicine, talk to your doctor, pharmacist, or health care provider.  2015, Elsevier/Gold Standard. (2012-10-08 21:34:59)

## 2014-08-27 LAB — PREALBUMIN: Prealbumin: 43 mg/dL (ref 21–43)

## 2014-08-30 ENCOUNTER — Encounter: Payer: Self-pay | Admitting: *Deleted

## 2014-08-30 ENCOUNTER — Telehealth: Payer: Self-pay | Admitting: Hematology & Oncology

## 2014-08-30 NOTE — Telephone Encounter (Addendum)
[employer] application completed and faxed along w letter of support to:   F: [phone] P: [phone]     COPY SCANNED

## 2014-08-30 NOTE — Progress Notes (Signed)
[place] Psychosocial Distress Screening Clinical Social Work  Clinical Social Work was referred by distress screening protocol.  The patient scored a 10 on the Psychosocial Distress Thermometer which indicates severe distress. Clinical Social Worker spoke with patient by phone to assess for distress and other psychosocial needs.  Patient stated he recently moved to [facility] from [place].  He had Medicaid in [place] and had recently applied in [place].  Patient stated he has a J tube and is unable to afford his formula because his medicaid is not active.  CSW and patient discussed resources and made referral to [facility] dietician.  [place] will contact patient with information on available resources.  CSW also informed patient on the support team and support services at [facility], and encouraged him to call with questions or concerns.          ONCBCN DISTRESS SCREENING 08/26/2014  Screening Type Initial Screening  Distress experienced in past week (1-10) 10  Practical problem type Housing;Insurance;Food  Emotional problem type Adjusting to illness  Physical Problem type Pain;Sleep/insomnia  Physician notified of physical symptoms Yes  Referral to clinical psychology No  Referral to clinical social work Yes  Referral to dietition Yes  Referral to financial advocate No  Referral to support programs No  Referral to palliative care No   Johnnye Lana, MSW, LCSW, OSW-C Clinical Social Worker [place] [phone]

## 2014-08-30 NOTE — Telephone Encounter (Signed)
Derek Peace, NP requested medical records from [place]. However, the fax they gave me is not working. Therefore, the consent release form was sent via mail to:  [facility] [address], [place] [address]  [phone] Fax: [phone]        COPY SCANNED

## 2014-08-31 ENCOUNTER — Ambulatory Visit (HOSPITAL_COMMUNITY)
Admission: RE | Admit: 2014-08-31 | Discharge: 2014-08-31 | Disposition: A | Discharge status: Home / Self Care | Payer: Medicaid Other | Source: Ambulatory Visit | Attending: Family | Admitting: Family

## 2014-08-31 ENCOUNTER — Other Ambulatory Visit: Payer: Self-pay | Admitting: Family

## 2014-08-31 ENCOUNTER — Ambulatory Visit (HOSPITAL_COMMUNITY)
Admission: RE | Admit: 2014-08-31 | Discharge: 2014-08-31 | Disposition: A | Discharge status: Home / Self Care | Payer: Medicaid Other | Source: Ambulatory Visit

## 2014-08-31 DIAGNOSIS — C069 Malignant neoplasm of mouth, unspecified: Secondary | ICD-10-CM

## 2014-09-01 ENCOUNTER — Other Ambulatory Visit: Payer: Self-pay | Admitting: Family

## 2014-09-01 DIAGNOSIS — C069 Malignant neoplasm of mouth, unspecified: Secondary | ICD-10-CM

## 2014-09-01 MED ORDER — SCOPOLAMINE 1 MG/3DAYS TD PT72: 1.0000 | MEDICATED_PATCH | TRANSDERMAL | Status: DC

## 2014-09-02 ENCOUNTER — Ambulatory Visit: Payer: Self-pay | Admitting: Hematology

## 2014-09-02 ENCOUNTER — Ambulatory Visit: Payer: Self-pay

## 2014-09-02 ENCOUNTER — Ambulatory Visit (HOSPITAL_BASED_OUTPATIENT_CLINIC_OR_DEPARTMENT_OTHER): Payer: No Typology Code available for payment source

## 2014-09-02 ENCOUNTER — Ambulatory Visit: Payer: No Typology Code available for payment source | Admitting: Nutrition

## 2014-09-02 ENCOUNTER — Ambulatory Visit (HOSPITAL_BASED_OUTPATIENT_CLINIC_OR_DEPARTMENT_OTHER)
Admission: RE | Admit: 2014-09-02 | Discharge: 2014-09-02 | Disposition: A | Discharge status: Home / Self Care | Payer: Medicaid Other | Source: Ambulatory Visit | Attending: Family | Admitting: Family

## 2014-09-02 DIAGNOSIS — Z9221 Personal history of antineoplastic chemotherapy: Secondary | ICD-10-CM | POA: Insufficient documentation

## 2014-09-02 DIAGNOSIS — C069 Malignant neoplasm of mouth, unspecified: Secondary | ICD-10-CM | POA: Insufficient documentation

## 2014-09-02 MED ORDER — IOHEXOL 300 MG/ML  SOLN
80.0000 mL | Freq: Once | INTRAMUSCULAR | Status: AC | PRN
  Administered 2014-09-02: 80 mL via INTRAVENOUS

## 2014-09-02 NOTE — Progress Notes (Signed)
Referral received from social worker to contact patient for assistance with obtaining tube feedings. Spoke with patient over the phone who reports he just moved here from [place].   He has no financial ability to pay for his tube feedings.  States he has applied for Medicaid. Patient currently using Carnation breakfast through his jejunostomy feeding tube. Reports he tolerated Jevity 1.2 in [place]. Apparently patient uses bolus feedings with syringe in jejunostomy tube but has never used a feeding pump.  He denies tolerance issues. I will provide patient with samples of tube feedings and additional syringes for water flushes. Will work with patient to order tube feeding through homecare agency. Patient verbalizes appreciation. Patient agrees to pick up formula and additional syringes from [facility] on Friday, April 22.  **Disclaimer: This note was dictated with voice recognition software. Similar sounding words can inadvertently be transcribed and this note may contain transcription errors which may not have been corrected upon publication of note.**

## 2014-09-03 ENCOUNTER — Encounter (HOSPITAL_COMMUNITY)
Admission: RE | Admit: 2014-09-03 | Discharge: 2014-09-03 | Disposition: A | Discharge status: Home / Self Care | Payer: Medicaid Other | Source: Ambulatory Visit | Attending: Family | Admitting: Family

## 2014-09-03 ENCOUNTER — Other Ambulatory Visit: Payer: Self-pay | Admitting: Family

## 2014-09-03 DIAGNOSIS — C069 Malignant neoplasm of mouth, unspecified: Secondary | ICD-10-CM | POA: Insufficient documentation

## 2014-09-03 LAB — GLUCOSE, CAPILLARY: Glucose-Capillary: 98 mg/dL (ref 70–99)

## 2014-09-03 MED ORDER — FLUDEOXYGLUCOSE F - 18 (FDG) INJECTION
7.1000 | Freq: Once | INTRAVENOUS | Status: AC | PRN
  Administered 2014-09-03: 7.1 via INTRAVENOUS

## 2014-09-04 ENCOUNTER — Emergency Department (HOSPITAL_COMMUNITY): Payer: Medicaid Other

## 2014-09-04 ENCOUNTER — Emergency Department (HOSPITAL_COMMUNITY)
Admission: EM | Admit: 2014-09-04 | Discharge: 2014-09-05 | Disposition: A | Discharge status: Home / Self Care | Payer: Medicaid Other | Attending: Emergency Medicine | Admitting: Emergency Medicine

## 2014-09-04 ENCOUNTER — Encounter (HOSPITAL_COMMUNITY): Payer: Self-pay | Admitting: Emergency Medicine

## 2014-09-04 DIAGNOSIS — Z87891 Personal history of nicotine dependence: Secondary | ICD-10-CM | POA: Insufficient documentation

## 2014-09-04 DIAGNOSIS — K5900 Constipation, unspecified: Secondary | ICD-10-CM | POA: Diagnosis not present

## 2014-09-04 DIAGNOSIS — Z85810 Personal history of malignant neoplasm of tongue: Secondary | ICD-10-CM | POA: Diagnosis not present

## 2014-09-04 DIAGNOSIS — R109 Unspecified abdominal pain: Secondary | ICD-10-CM

## 2014-09-04 NOTE — ED Notes (Signed)
Pt reports constipation x 5 days; pt reports self digital removal but only minimally effective; pt also reports using OTC remedies without relief; pt reports CA patient and takes medications that make him constipated

## 2014-09-05 MED ORDER — MAGNESIUM CITRATE PO SOLN
1.0000 | Freq: Once | ORAL | Status: AC
  Administered 2014-09-05: 1 via ORAL
  Filled 2014-09-05: qty 296

## 2014-09-05 MED ORDER — PEG 3350-KCL-NABCB-NACL-NASULF 236 G PO SOLR: 4000.0000 mL | Freq: Once | ORAL | Status: DC

## 2014-09-05 NOTE — ED Provider Notes (Signed)
CSN: [id number]     Arrival date & time 09/04/14  2146 History   First MD Initiated Contact with Patient 09/04/14 2344     Chief Complaint  Patient presents with  . Constipation     (Consider location/radiation/quality/duration/timing/severity/associated sxs/prior Treatment) The history is provided by the patient. No language interpreter was used.     Derek Campos is a 44 y.o. male presenting for c/o constipation x 5 days. PMH of oral cancer and taking morphine, Tylenol 3, and fentanyl patch daily. Pt reports lower abdominal pain. Tried 2 enemas and self digital rectal exam with no success. No fever, nausea, vomiting or urinary symptoms. He reports symptoms are typical for constipation. No history of obstruction.    Past Medical History  Diagnosis Date  . Cancer 2015    tongue   Past Surgical History  Procedure Laterality Date  . Sp perc place gastric tube     History reviewed. No pertinent family history. History  Substance Use Topics  . Smoking status: Former Smoker    Quit date: 01/03/2014  . Smokeless tobacco: Not on file  . Alcohol Use: No    Review of Systems  Constitutional: Negative for fever, chills, diaphoresis and fatigue.  Cardiovascular: Negative for chest pain.  Gastrointestinal: Positive for abdominal pain and constipation. Negative for nausea, vomiting, diarrhea, blood in stool, abdominal distention, anal bleeding and rectal pain.  Genitourinary: Negative for difficulty urinating.      Allergies  Review of patient's allergies indicates not on file.  Home Medications   Prior to Admission medications   Not on File   BP 109/67 mmHg  Pulse 70  Temp(Src) 98 F (36.7 C) (Oral)  Resp 16  Ht 5\' 9"  (1.753 m)  Wt 124 lb 9 oz (56.501 kg)  BMI 18.39 kg/m2  SpO2 98% Physical Exam  Constitutional: He is oriented to person, place, and time. He appears well-developed and well-nourished.  Neck: Normal range of motion.  Pulmonary/Chest: Effort normal.   Abdominal: Soft. He exhibits no distension and no mass. There is no tenderness.  Genitourinary:  Hard stool at the tip of finger on digital rectal exam. Unable to disimpact.   Musculoskeletal: Normal range of motion.  Neurological: He is alert and oriented to person, place, and time.  Skin: Skin is warm and dry.  Psychiatric: He has a normal mood and affect.    ED Course  Procedures (including critical care time) Labs Review Labs Reviewed - No data to display  Imaging Review Dg Abd 1 View  09/04/2014   CLINICAL DATA:  Abdominal pain and constipation for 5 days.  EXAM: ABDOMEN - 1 VIEW  COMPARISON:  None.  FINDINGS: The bowel gas pattern is normal. No radio-opaque calculi or other significant radiographic abnormality are seen. The colon is filled with enteric contrast from previous exam. Gastrostomy tube overlies the LEFT upper quadrant.  IMPRESSION: No acute findings.   Electronically Signed   By: Rolla Flatten M.D.   On: 09/04/2014 23:06     EKG Interpretation None      MDM   Final diagnoses:  None    1. Constipation  No obstruction visible on imaging. He is well appearing, no fever, normal VS. Stool in rectum is normal in color without history of bleeding. He is given Mag Citrate and Golyetely as he states this has worked in the past for constipation. He is felt appropriate for discharge.     Derek Lange, PA-C 09/05/14 [id number]  Everlene Balls, MD 09/05/14  1429 

## 2014-09-05 NOTE — Discharge Instructions (Signed)
RECOMMEND COLACE STOOL SOFTENER USED REGULARLY. PUSH FLUIDS. MIRALAX CAN BE FOUND OVER-THE-COUNTER FOR USE ON A REGULAR BASIS AS WELL. FOR TOMORROW, USE THE GOLYTELY AS DIRECTED. FOLLOW UP WITH YOUR PRIMARY CARE DOCTOR FOR RECHECK AS SOON AS POSSIBLE.   Constipation Constipation is when a person has fewer than three bowel movements a week, has difficulty having a bowel movement, or has stools that are dry, hard, or larger than normal. As people grow older, constipation is more common. If you try to fix constipation with medicines that make you have a bowel movement (laxatives), the problem may get worse. Long-term laxative use may cause the muscles of the colon to become weak. A low-fiber diet, not taking in enough fluids, and taking certain medicines may make constipation worse.  CAUSES   Certain medicines, such as antidepressants, pain medicine, iron supplements, antacids, and water pills.   Certain diseases, such as diabetes, irritable bowel syndrome (IBS), thyroid disease, or depression.   Not drinking enough water.   Not eating enough fiber-rich foods.   Stress or travel.   Lack of physical activity or exercise.   Ignoring the urge to have a bowel movement.   Using laxatives too much.  SIGNS AND SYMPTOMS   Having fewer than three bowel movements a week.   Straining to have a bowel movement.   Having stools that are hard, dry, or larger than normal.   Feeling full or bloated.   Pain in the lower abdomen.   Not feeling relief after having a bowel movement.  DIAGNOSIS  Your health care provider will take a medical history and perform a physical exam. Further testing may be done for severe constipation. Some tests may include:  A barium enema X-ray to examine your rectum, colon, and, sometimes, your small intestine.   A sigmoidoscopy to examine your lower colon.   A colonoscopy to examine your entire colon. TREATMENT  Treatment will depend on the severity  of your constipation and what is causing it. Some dietary treatments include drinking more fluids and eating more fiber-rich foods. Lifestyle treatments may include regular exercise. If these diet and lifestyle recommendations do not help, your health care provider may recommend taking over-the-counter laxative medicines to help you have bowel movements. Prescription medicines may be prescribed if over-the-counter medicines do not work.  HOME CARE INSTRUCTIONS   Eat foods that have a lot of fiber, such as fruits, vegetables, whole grains, and beans.  Limit foods high in fat and processed sugars, such as french fries, hamburgers, cookies, candies, and soda.   A fiber supplement may be added to your diet if you cannot get enough fiber from foods.   Drink enough fluids to keep your urine clear or pale yellow.   Exercise regularly or as directed by your health care provider.   Go to the restroom when you have the urge to go. Do not hold it.   Only take over-the-counter or prescription medicines as directed by your health care provider. Do not take other medicines for constipation without talking to your health care provider first.  [place] IF:   You have bright red blood in your stool.   Your constipation lasts for more than 4 days or gets worse.   You have abdominal or rectal pain.   You have thin, pencil-like stools.   You have unexplained weight loss. MAKE SURE YOU:   Understand these instructions.  Will watch your condition.  Will get help right away if you are  not doing well or get worse. Document Released: 01/27/2004 Document Revised: 05/05/2013 Document Reviewed: 02/09/2013 [facility] Patient Information 2015 [place], [place]. This information is not intended to replace advice given to you by your health care provider. Make sure you discuss any questions you have with your health care provider.

## 2014-09-05 NOTE — ED Notes (Signed)
Per PA pt able to take Mag when he arrives home

## 2014-09-06 ENCOUNTER — Encounter: Payer: Self-pay | Admitting: Family

## 2014-09-06 ENCOUNTER — Emergency Department (HOSPITAL_COMMUNITY)
Admission: EM | Admit: 2014-09-06 | Discharge: 2014-09-07 | Disposition: A | Discharge status: Home / Self Care | Payer: Medicaid Other | Attending: Emergency Medicine | Admitting: Emergency Medicine

## 2014-09-06 ENCOUNTER — Encounter (HOSPITAL_COMMUNITY): Payer: Self-pay | Admitting: Emergency Medicine

## 2014-09-06 DIAGNOSIS — C021 Malignant neoplasm of border of tongue: Secondary | ICD-10-CM | POA: Insufficient documentation

## 2014-09-06 DIAGNOSIS — R52 Pain, unspecified: Secondary | ICD-10-CM | POA: Diagnosis present

## 2014-09-06 DIAGNOSIS — Z720 Tobacco use: Secondary | ICD-10-CM | POA: Insufficient documentation

## 2014-09-06 DIAGNOSIS — Z79899 Other long term (current) drug therapy: Secondary | ICD-10-CM | POA: Insufficient documentation

## 2014-09-06 DIAGNOSIS — C069 Malignant neoplasm of mouth, unspecified: Secondary | ICD-10-CM

## 2014-09-06 DIAGNOSIS — G893 Neoplasm related pain (acute) (chronic): Secondary | ICD-10-CM | POA: Insufficient documentation

## 2014-09-06 MED ORDER — MAGIC MOUTHWASH
10.0000 mL | Freq: Once | ORAL | Status: AC
  Administered 2014-09-07: 10 mL via ORAL
  Filled 2014-09-06: qty 10

## 2014-09-06 NOTE — ED Provider Notes (Signed)
CSN: [id number]     Arrival date & time 09/06/14  2108 History   First MD Initiated Contact with Patient 09/06/14 2307     Chief Complaint  Patient presents with  . Pain     (Consider location/radiation/quality/duration/timing/severity/associated sxs/prior Treatment) Patient is a 44 y.o. male presenting with general illness.  Illness Location:  Tongue Quality:  Aching, sharp pain Severity:  Severe Onset quality:  Gradual Duration: months, acutely worsening over last 2 weeks. Timing:  Constant Progression:  Worsening Chronicity:  Chronic Context:  Scc of tongue Relieved by:  Nothing Worsened by:  Touch, eating/drinking Associated symptoms: no abdominal pain, no fever, no nausea and no vomiting     Past Medical History  Diagnosis Date  . 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 Cancer 2015    tongue   Past Surgical History  Procedure Laterality Date  . J peg    . Sp perc place gastric tube     Family History  Problem Relation Age of Onset  . Cancer Father   . Alcohol abuse Father    History  Substance Use Topics  . Smoking status: Current Some Day Smoker    Last Attempt to Quit: 01/03/2014  . Smokeless tobacco: Not on file  . Alcohol Use: No    Review of Systems  Constitutional: Negative for fever.  Gastrointestinal: Negative for nausea, vomiting and abdominal pain.  All other systems reviewed and are negative.     Allergies  Other  Home Medications   Prior to Admission medications   Medication Sig Start Date End Date Taking? Authorizing Provider  acetaminophen-codeine (TYLENOL #3) 300-30 MG per tablet Take 1 tablet by mouth every 8 (eight) hours as needed for moderate pain. 08/11/14  Yes Arnoldo Morale, MD  fentaNYL (DURAGESIC - DOSED MCG/HR) 25 MCG/HR patch Place 1 patch (25 mcg total) onto the skin every 3 (three) days. 08/26/14  Yes Eliezer Bottom, NP  gabapentin (NEURONTIN) 600 MG tablet Take 600 mg by mouth 3 (three) times daily.   Yes  Historical Provider, MD  ibuprofen (ADVIL,MOTRIN) 200 MG tablet Take 200 mg by mouth every 6 (six) hours as needed.   Yes Historical Provider, MD  lidocaine (XYLOCAINE) 2 % solution USE 20 MLS IN THE MOUTH OR THROAT AS NEEDED FOR MOUTH PAIN. 08/25/14  Yes Arnoldo Morale, MD  Morphine Sulfate (MORPHINE CONCENTRATE) 10 mg / 0.5 ml concentrated solution Take 0.5 mLs (10 mg total) by mouth every 4 (four) hours as needed for severe pain. 08/26/14  Yes Eliezer Bottom, NP  nicotine (NICODERM CQ - DOSED IN MG/24 HOURS) 14 mg/24hr patch Place 1 patch (14 mg total) onto the skin daily. For 6 weeks, then apply 7mg /24 hr for 2 weeks 08/11/14  Yes Arnoldo Morale, MD  Nutritional Supplements (FEEDING SUPPLEMENT, JEVITY 1.2 CAL,) LIQD Place 480 mLs into feeding tube 4 (four) times daily.   Yes Historical Provider, MD  Nutritional Supplements (JEVITY 1.5 CAL PO) Take 474 mLs by mouth 3 (three) times daily.   Yes Historical Provider, MD  omeprazole (PRILOSEC) 20 MG capsule Take 20 mg by mouth daily.   Yes Historical Provider, MD  Alum & Mag Hydroxide-Simeth (MAGIC MOUTHWASH W/LIDOCAINE) SOLN Take 10 mLs by mouth 3 (three) times daily. Patient not taking: Reported on 09/06/2014 08/20/14   Mercedes Camprubi-Soms, PA-C  gabapentin (NEURONTIN) 300 MG capsule Take 2 capsules (600 mg total) by mouth 2 (two) times daily. Patient taking differently: Take 600 mg  by mouth 3 (three) times daily.  08/11/14   Arnoldo Morale, MD  oxyCODONE-acetaminophen (PERCOCET) 5-325 MG per tablet Take 1 tablet by mouth every 6 (six) hours as needed for severe pain. Patient not taking: Reported on 08/26/2014 08/20/14   Mercedes Camprubi-Soms, PA-C  polyethylene glycol (GOLYTELY) 236 G solution Take 4,000 mLs by mouth once. Patient not taking: Reported on 09/06/2014 09/05/14   Charlann Lange, PA-C   BP 123/83 mmHg  Pulse 74  Temp(Src) 97.9 F (36.6 C) (Oral)  Resp 16  Ht 5\' 9"  (1.753 m)  Wt 125 lb (56.7 kg)  BMI 18.45 kg/m2  SpO2 98% Physical Exam   Constitutional: He is oriented to person, place, and time. He appears well-developed and well-nourished.  HENT:  Head: Normocephalic and atraumatic.  Necrotic ulceration of entirety of tongue, no swelling of floor of mouth  Eyes: Conjunctivae and EOM are normal.  Neck: Normal range of motion. Neck supple.  Cardiovascular: Normal rate, regular rhythm and normal heart sounds.   Pulmonary/Chest: Effort normal and breath sounds normal. No respiratory distress.  Abdominal: He exhibits no distension. There is no tenderness. There is no rebound and no guarding.  Musculoskeletal: Normal range of motion.  Neurological: He is alert and oriented to person, place, and time.  Skin: Skin is warm and dry.  Vitals reviewed.   ED Course  Procedures (including critical care time) Labs Review Labs Reviewed  CBC WITH DIFFERENTIAL/PLATELET - Abnormal; Notable for the following:    RBC 3.25 (*)    Hemoglobin 10.3 (*)    HCT 31.2 (*)    Neutrophils Relative % 80 (*)    Lymphocytes Relative 7 (*)    Lymphs Abs 0.4 (*)    All other components within normal limits  BASIC METABOLIC PANEL    Imaging Review No results found.   EKG Interpretation None      MDM   Final diagnoses:  Oral cancer    44 y.o. male with pertinent PMH of SCC of tongue presents with acute on chronic tongue pain related to known malignant process.  He states he spoke with his oncologist today, who told him to come in for surgery.  Physical exam with ulcerated necrotic tongue without concern for ludwigs or other emergent process.  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  He also has chronic pain and is on a fentanyl patch and chronic narcotics.  He denies acute change in symptoms with exception of worsening appearance and pain of tongue.  Do not feel symptoms reflect acute infection or thrush.  I am unable to find record of prior visits with oncology and I spoke with on call oncology who are unaware of pt.  DC home in  stable condition.    I have reviewed all laboratory and imaging studies if ordered as above  1. Oral cancer         Debby Freiberg, MD 09/07/14 [phone]

## 2014-09-06 NOTE — ED Notes (Signed)
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

## 2014-09-07 LAB — BASIC METABOLIC PANEL
Anion gap: 7 (ref 5–15)
BUN: 18 mg/dL (ref 6–23)
CHLORIDE: 98 mmol/L (ref 96–112)
CO2: 31 mmol/L (ref 19–32)
CREATININE: 0.73 mg/dL (ref 0.50–1.35)
Calcium: 9 mg/dL (ref 8.4–10.5)
GFR calc Af Amer: >90 mL/min (ref >90)
GFR calc non Af Amer: >90 mL/min (ref >90)
GLUCOSE: 93 mg/dL (ref 70–99)
POTASSIUM: 3.9 mmol/L (ref 3.5–5.1)
Sodium: 136 mmol/L (ref 135–145)

## 2014-09-07 LAB — CBC WITH DIFFERENTIAL/PLATELET
BASOS PCT: 0 % (ref 0–1)
Basophils Absolute: 0 10*3/uL (ref 0.0–0.1)
Eosinophils Absolute: 0.1 10*3/uL (ref 0.0–0.7)
Eosinophils Relative: 1 % (ref 0–5)
HEMATOCRIT: 31.2 % — AB (ref 39.0–52.0)
Hemoglobin: 10.3 g/dL — ABNORMAL LOW (ref 13.0–17.0)
LYMPHS PCT: 7 % — AB (ref 12–46)
Lymphs Abs: 0.4 10*3/uL — ABNORMAL LOW (ref 0.7–4.0)
MCH: 31.7 pg (ref 26.0–34.0)
MCHC: 33 g/dL (ref 30.0–36.0)
MCV: 96 fL (ref 78.0–100.0)
Monocytes Absolute: 0.7 10*3/uL (ref 0.1–1.0)
Monocytes Relative: 12 % (ref 3–12)
NEUTROS ABS: 4.7 10*3/uL (ref 1.7–7.7)
NEUTROS PCT: 80 % — AB (ref 43–77)
Platelets: 255 10*3/uL (ref 150–400)
RBC: 3.25 MIL/uL — ABNORMAL LOW (ref 4.22–5.81)
RDW: 12.3 % (ref 11.5–15.5)
WBC: 5.8 10*3/uL (ref 4.0–10.5)

## 2014-09-07 MED ORDER — SODIUM CHLORIDE 0.9 % IV BOLUS (SEPSIS)
1000.0000 mL | Freq: Once | INTRAVENOUS | Status: AC
  Administered 2014-09-07: 1000 mL via INTRAVENOUS

## 2014-09-07 MED ORDER — LIDOCAINE VISCOUS 2 % MT SOLN
15.0000 mL | Freq: Once | OROMUCOSAL | Status: AC
  Administered 2014-09-07: 15 mL via OROMUCOSAL
  Filled 2014-09-07: qty 15

## 2014-09-07 NOTE — ED Notes (Signed)
Food and water offered to patient. Patient refuses states unable to eat or drink due to pain.

## 2014-09-07 NOTE — ED Notes (Signed)
Pt alert x4 respirations easy non labored.  

## 2014-09-07 NOTE — Discharge Instructions (Signed)
Cancer of the Tongue  Cancer of the tongue occurs when a group of cells on the tongue become abnormal and start to grow out of control. Most of the time, tongue cancer starts in very thin, flat cells that cover the surface of your tongue (squamous cells). Cancer cells can spread and form a mass of cells called a tumor. The tumor may spread deeper into the tongue, or it may spread to other areas of the body (metastasize).  RISK FACTORS  The exact cause of cancer of the tongue is not known. However, some risk factors make this more likely:  · Use of tobacco products, including cigarettes, pipes, cigars, smokeless (chewing) tobacco, and snuff. This is the number one risk factor of cancer of the tongue.  · Male gender.  · Age of 50 years or older.  · Poor oral hygiene (not brushing or flossing your teeth regularly).  · Frequent use of alcohol.  · Human papillomavirus (HPV) infection.  · Family history of tongue cancer.  SYMPTOMS  Tongue cancer can start in 1 of 2 places. It can start at the front part of the tongue or at the base of the tongue at the back of your throat. Symptoms of cancer at the front part of your tongue may include:  · A lump or sore on your tongue that may be painful (especially when you eat or speak) and may not heal. It also may bleed easily if you bite it or touch it.  · Numbness on your tongue.  · Difficulty moving your tongue.  · Pain when chewing.  · Difficulty pronouncing or saying certain words or making certain sounds.  · A bad odor in your mouth.  · A lump on your neck.                                                          Cancer at the base of the tongue is harder to see. Symptoms may not show up as soon as they do for cancer at the front part of your tongue. Symptoms may include:  · A feeling in your throat that you are choking (especially when you are lying down).  · Difficulty swallowing or pain when you swallow.  · A muffled voice.  · Ear pain.  · Pain when you try to open your  mouth or difficulty opening your mouth.  · A lump on your neck.  DIAGNOSIS  To diagnose tongue cancer, your caregiver may perform the following exams:  · A physical exam of your mouth, throat, and neck for a sore or lump. Your caregiver may use a mirror with a long handle or a thin, flexible tube with a tiny light and camera at the end (fiberscope) to see the back of your mouth.  · Removal and exam of a small number of cells (biopsy) from your tongue or a lump on your neck. The cells are checked for cancerous formations under a microscope.  · Imaging exams, such as X-rays of your mouth and neck. The images can show if there is an abnormal mass.  If you do have cancer, your caregiver will stage your cancer. Staging provides an idea of how advanced your cancer is. The stage will depend on how much your cancer has grown and if it has metastasized. The meaning of the stage depends on the type of cancer. For tongue cancer:  · Stage I means the cancer is the size of a peanut or smaller. It has not metastasized.  · Stage II means the cancer is larger than a peanut, but not larger than a walnut. It has not metastasized.  ·   Stage III means the cancer has grown larger than a walnut. It may have spread to a lymph node or lymph gland on the same side of your neck as the cancer. (Lymph is a fluid that carries white blood cells all over your body. White blood cells fight infection.)  · Stage IV means the cancer has spread to nearby areas. It may have spread heavily into your lymph glands.  TREATMENT  Treatment for tongue cancer can vary. It will depend on the stage and location of your cancer and your overall health. Treatment options may include:  · Radiation therapy. This uses waves of nuclear energy to kill cancer cells on your tongue. It may be used for stage I and II cancers. It often is used for cancers at the base of your tongue.  · Surgery:  ¨ Surgery is done if your tumor is small, has not spread, and is at the front of  your tongue.  ¨ Surgery may be done to remove tumors that have spread into your neck or lymph nodes.  · A combination of surgery, radiation, and drugs that kills cancer cells (chemotherapy). This may be done for stage III and stage IV cancers.  SEEK MEDICAL CARE IF:  · Your tongue hurts or is numb.  · The way you speak changes.  · The way you swallow changes.  · You notice a lump on your neck.  · You have an oral temperature above 100.5° F (38.1° C).  SEEK IMMEDIATE MEDICAL CARE IF:   · You have pain that gets worse.  · Your tongue or mouth bleeds.  · Your lip, mouth, or neck swells.  · You have trouble swallowing.  · You have trouble breathing.  · You have an oral temperature above 102° F (38.9° C).  Document Released: 08/15/2010 Document Revised: 07/23/2011 Document Reviewed: 08/15/2010  Exit[employer]® Patient Information ©2015 [employer]. This information is not intended to replace advice given to you by your health care provider. Make sure you discuss any questions you have with your health care provider.

## 2014-09-07 NOTE — ED Notes (Signed)
Pt c/o pain in tongue/ mouth region. Thick speech noted. Controlling secretions without difficulty

## 2014-09-08 ENCOUNTER — Other Ambulatory Visit: Payer: No Typology Code available for payment source

## 2014-09-08 ENCOUNTER — Encounter: Payer: Self-pay | Admitting: Hematology & Oncology

## 2014-09-08 ENCOUNTER — Ambulatory Visit (HOSPITAL_BASED_OUTPATIENT_CLINIC_OR_DEPARTMENT_OTHER): Payer: No Typology Code available for payment source | Admitting: Family

## 2014-09-08 ENCOUNTER — Other Ambulatory Visit: Payer: Self-pay | Admitting: *Deleted

## 2014-09-08 ENCOUNTER — Telehealth: Payer: Self-pay | Admitting: Hematology & Oncology

## 2014-09-08 ENCOUNTER — Ambulatory Visit: Payer: Self-pay | Admitting: Family

## 2014-09-08 ENCOUNTER — Ambulatory Visit (HOSPITAL_BASED_OUTPATIENT_CLINIC_OR_DEPARTMENT_OTHER): Payer: No Typology Code available for payment source

## 2014-09-08 VITALS — BP 113/79 | HR 63 | Temp 97.4°F | Wt 122.0 lb

## 2014-09-08 DIAGNOSIS — K146 Glossodynia: Secondary | ICD-10-CM

## 2014-09-08 DIAGNOSIS — C069 Malignant neoplasm of mouth, unspecified: Secondary | ICD-10-CM

## 2014-09-08 DIAGNOSIS — R918 Other nonspecific abnormal finding of lung field: Secondary | ICD-10-CM

## 2014-09-08 DIAGNOSIS — Z85818 Personal history of malignant neoplasm of other sites of lip, oral cavity, and pharynx: Secondary | ICD-10-CM

## 2014-09-08 DIAGNOSIS — Z720 Tobacco use: Secondary | ICD-10-CM

## 2014-09-08 DIAGNOSIS — K149 Disease of tongue, unspecified: Secondary | ICD-10-CM

## 2014-09-08 DIAGNOSIS — R599 Enlarged lymph nodes, unspecified: Secondary | ICD-10-CM

## 2014-09-08 MED ORDER — HYDROMORPHONE HCL 4 MG/ML IJ SOLN
6.0000 mg | Freq: Once | INTRAMUSCULAR | Status: AC
  Administered 2014-09-08: 6 mg via INTRAVENOUS

## 2014-09-08 MED ORDER — MORPHINE SULFATE (CONCENTRATE) 10 MG /0.5 ML PO SOLN: ORAL | Status: DC

## 2014-09-08 MED ORDER — SODIUM CHLORIDE 0.9 % IV SOLN
Freq: Once | INTRAVENOUS | Status: AC
  Administered 2014-09-08: 13:00:00 via INTRAVENOUS

## 2014-09-08 MED ORDER — HYDROMORPHONE HCL 4 MG/ML IJ SOLN: 6.0000 mg | Freq: Once | INTRAMUSCULAR | Status: DC

## 2014-09-08 MED ORDER — HYDROMORPHONE HCL 4 MG/ML IJ SOLN
INTRAMUSCULAR | Status: AC
  Filled 2014-09-08: qty 2

## 2014-09-08 MED ORDER — FENTANYL 75 MCG/HR TD PT72: 75.0000 ug | MEDICATED_PATCH | TRANSDERMAL | Status: DC

## 2014-09-08 MED ORDER — GABAPENTIN 300 MG PO CAPS: 600.0000 mg | ORAL_CAPSULE | Freq: Four times a day (QID) | ORAL | Status: DC

## 2014-09-08 NOTE — Telephone Encounter (Signed)
Faxed completed application w letter of support to:  [employer] P: [phone] F: [phone]     COPY SCANNED

## 2014-09-08 NOTE — Patient Instructions (Signed)
Dehydration, Adult Dehydration is when you lose more fluids from the body than you take in. Vital organs like the kidneys, brain, and heart cannot function without a proper amount of fluids and salt. Any loss of fluids from the body can cause dehydration.  CAUSES   Vomiting.  Diarrhea.  Excessive sweating.  Excessive urine output.  Fever. SYMPTOMS  Mild dehydration  Thirst.  Dry lips.  Slightly dry mouth. Moderate dehydration  Very dry mouth.  Sunken eyes.  Skin does not bounce back quickly when lightly pinched and released.  Dark urine and decreased urine production.  Decreased tear production.  Headache. Severe dehydration  Very dry mouth.  Extreme thirst.  Rapid, weak pulse (more than 100 beats per minute at rest).  Cold hands and feet.  Not able to sweat in spite of heat and temperature.  Rapid breathing.  Blue lips.  Confusion and lethargy.  Difficulty being awakened.  Minimal urine production.  No tears. DIAGNOSIS  Your caregiver will diagnose dehydration based on your symptoms and your exam. Blood and urine tests will help confirm the diagnosis. The diagnostic evaluation should also identify the cause of dehydration. TREATMENT  Treatment of mild or moderate dehydration can often be done at home by increasing the amount of fluids that you drink. It is best to drink small amounts of fluid more often. Drinking too much at one time can make vomiting worse. Refer to the home care instructions below. Severe dehydration needs to be treated at the hospital where you will probably be given intravenous (IV) fluids that contain water and electrolytes. HOME CARE INSTRUCTIONS   Ask your caregiver about specific rehydration instructions.  Drink enough fluids to keep your urine clear or pale yellow.  Drink small amounts frequently if you have nausea and vomiting.  Eat as you normally do.  Avoid:  Foods or drinks high in sugar.  Carbonated  drinks.  Juice.  Extremely hot or cold fluids.  Drinks with caffeine.  Fatty, greasy foods.  Alcohol.  Tobacco.  Overeating.  Gelatin desserts.  Wash your hands well to avoid spreading bacteria and viruses.  Only take over-the-counter or prescription medicines for pain, discomfort, or fever as directed by your caregiver.  Ask your caregiver if you should continue all prescribed and over-the-counter medicines.  Keep all follow-up appointments with your caregiver. SEEK MEDICAL CARE IF:  You have abdominal pain and it increases or stays in one area (localizes).  You have a rash, stiff neck, or severe headache.  You are irritable, sleepy, or difficult to awaken.  You are weak, dizzy, or extremely thirsty. SEEK IMMEDIATE MEDICAL CARE IF:   You are unable to keep fluids down or you get worse despite treatment.  You have frequent episodes of vomiting or diarrhea.  You have blood or green matter (bile) in your vomit.  You have blood in your stool or your stool looks black and tarry.  You have not urinated in 6 to 8 hours, or you have only urinated a small amount of very dark urine.  You have a fever.  You faint. MAKE SURE YOU:   Understand these instructions.  Will watch your condition.  Will get help right away if you are not doing well or get worse. Document Released: 04/30/2005 Document Revised: 07/23/2011 Document Reviewed: 12/18/2010 [employer] Patient Information 2015 [employer]. This information is not intended to replace advice given to you by your health care provider. Make sure you discuss any questions you have with your health care   provider.  

## 2014-09-08 NOTE — Progress Notes (Signed)
Hematology and Oncology Follow Up Visit  Derek Campos [id number] 05-09-1971 44 y.o. 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 is here today after going to the ED yesterday for uncontrolled pain of the tongue. He also states that parts or his tongue are "falling off." When I examined his tongue I see that the lesion on the left side of his tongue has now spread to the front of his tongue. He is unable to swallow due to pain.  He is feeding and giving himself fluids down his J-tube 3 times a day. He is down 6 lbs since we saw him on the 14th.  He was unable to complete his MRI due to pain and being unable to stay still. He did have a CT of the head which was normal. His PET scan showed hypermetabolism of the tongue, cervical lymphadenopathy and multiple ill-defined nodular areas scattered throughout both lungs. We have set him up with an appoint with otolaryngology Dr. Aida Puffer at [facility] on Friday.  He has had no fever, chills, n/v, cough, rash, dizziness, SOB, chest pain, palpitations, abdominal pain, constipation, diarrhea, blood in urine or stool.  No swelling, tenderness, numbness or tingling in his extremities.  We did received his record from [facility] in [place]. He was treated with with 3 cycles of Cisplatin and 35 radiation treatments.  He is still smoking 2 cigarettes a day.   Medications:    Medication List       This list is accurate as of: 09/08/14  4:05 PM.  Always use your most recent med list.               acetaminophen-codeine 300-30 MG per tablet  Commonly known as:  TYLENOL #3  Take 1 tablet by mouth every 8 (eight) hours as needed for moderate pain.     feeding supplement (JEVITY 1.2 CAL) Liqd  Place 480 mLs into feeding tube 4 (four) times daily.     JEVITY 1.5 CAL PO  Take 474 mLs by mouth 3 (three) times  daily.     fentaNYL 75 MCG/HR  Commonly known as:  DURAGESIC - dosed mcg/hr  Place 1 patch (75 mcg total) onto the skin every 3 (three) days.     gabapentin 600 MG tablet  Commonly known as:  NEURONTIN  Take 600 mg by mouth 3 (three) times daily.     gabapentin 300 MG capsule  Commonly known as:  NEURONTIN  Take 2 capsules (600 mg total) by mouth 2 (two) times daily.     ibuprofen 200 MG tablet  Commonly known as:  ADVIL,MOTRIN  Take 200 mg by mouth every 6 (six) hours as needed.     lidocaine 2 % solution  Commonly known as:  XYLOCAINE  USE 20 MLS IN THE MOUTH OR THROAT AS NEEDED FOR MOUTH PAIN.     magic mouthwash w/lidocaine Soln  Take 10 mLs by mouth 3 (three) times daily.     morphine CONCENTRATE 10 mg / 0.5 ml concentrated solution  20-40 mg (1-2 ml) every 2 hours as needed for pain.     nicotine 14 mg/24hr patch  Commonly known as:  NICODERM CQ - dosed in mg/24 hours  Place 1 patch (14 mg total) onto the skin daily. For 6 weeks, then apply 7mg /24 hr for 2 weeks  omeprazole 20 MG capsule  Commonly known as:  PRILOSEC  Take 20 mg by mouth daily.     oxyCODONE-acetaminophen 5-325 MG per tablet  Commonly known as:  PERCOCET  Take 1 tablet by mouth every 6 (six) hours as needed for severe pain.     polyethylene glycol 236 G solution  Commonly known as:  GOLYTELY  Take 4,000 mLs by mouth once.        Allergies:  Allergies  Allergen Reactions  . Other     Cat Gut Stitches - unknown reaction.Told by parents.    Past Medical History, Surgical history, Social history, and Family History were reviewed and updated.  Review of Systems: All other 10 point review of systems is negative.   Physical Exam:  weight is 122 lb (55.339 kg). His oral temperature is 97.4 F (36.3 C). His blood pressure is 113/79 and his pulse is 63.   Wt Readings from Last 3 Encounters:  09/08/14 122 lb (55.339 kg)  09/06/14 125 lb (56.7 kg)  09/04/14 124 lb 9 oz (56.501 kg)     Ocular: Sclerae unicteric, pupils equal, round and reactive to light Ear-nose-throat: Oropharynx clear, dentition fair Lymphatic: No cervical or supraclavicular adenopathy Lungs no rales or rhonchi, good excursion bilaterally Heart regular rate and rhythm, no murmur appreciated Abd soft, nontender, positive bowel sounds MSK no focal spinal tenderness, no joint edema Neuro: non-focal, well-oriented, appropriate affect  Lab Results  Component Value Date   WBC 5.8 09/07/2014   HGB 10.3* 09/07/2014   HCT 31.2* 09/07/2014   MCV 96.0 09/07/2014   PLT 255 09/07/2014   No results found for: FERRITIN, IRON, TIBC, UIBC, IRONPCTSAT Lab Results  Component Value Date   RBC 3.25* 09/07/2014   No results found for: KPAFRELGTCHN, LAMBDASER, KAPLAMBRATIO No results found for: IGGSERUM, IGA, IGMSERUM No results found for: Odetta Pink, SPEI   Chemistry      Component Value Date/Time   NA 136 09/07/2014 0031   NA 142 08/26/2014 1150   K 3.9 09/07/2014 0031   K 4.0 08/26/2014 1150   CL 98 09/07/2014 0031   CL 98 08/26/2014 1150   CO2 31 09/07/2014 0031   CO2 33 08/26/2014 1150   BUN 18 09/07/2014 0031   BUN 17 08/26/2014 1150   CREATININE 0.73 09/07/2014 0031   CREATININE 0.8 08/26/2014 1150      Component Value Date/Time   CALCIUM 9.0 09/07/2014 0031   CALCIUM 10.4* 08/26/2014 1150   ALKPHOS 69 08/26/2014 1150   AST 33 08/26/2014 1150   ALT 28 08/26/2014 1150   BILITOT 0.50 08/26/2014 1150     Impression and Plan: [name] if a pleasant 44 yo white male with a recent history of oral cancer. He now appears to have a recurrent lesion of the left side of his tongue. He has uncontrollable pain and is unable to swallow.  PET scan also showed cervical lymphadenopathy and multiple ill-defined nodular areas scattered throughout both lungs.  We have set him up with Dr. Aida Puffer at [facility] on Friday.  We did give him  fluids and 6 mg Dilaudid IV for pain while he was here today. He is feeling better. His step-father brought him in today, he did not drive himself.   We will increase his Fentanyl patch to 75 mcg and Morphine concentrate to 20-40 mg q 2 hours as needed for pain.  His Neurontin was also increased to 600 mg QID.  He will increase his J-tube feeds to 4-5 times daily. We will continue to monitor his weight closely.  All questions were answered. He knows to call the clinic with any problems, questions or concerns. We can certainly see him much sooner if necessary. This was a shared visit with Dr. Marin Olp. He is in agreement with the aforementioned.   Eliezer Bottom, NP 4/27/20164:05 PM   Addendum:  I saw and examined the patient with Johnica Armwood. His PET scan certainly looks as if he does have residual/metastatic disease.  I still think that the best way to try to help him is to try to excise the lesion in his mouth.  I spoke with Dr. Conley Canal at [facility]. He will try to see him on Friday.  I had Mr. Mancia scans put on a disc. I copied his notes. He will bring these with him.  If surgery is not an option, then the only course of action would be systemic chemotherapy.  I will await to hear from Dr. Conley Canal.  We spent a good 45 minutes with Mr.Willard.   Lum Keas

## 2014-09-13 DIAGNOSIS — C770 Secondary and unspecified malignant neoplasm of lymph nodes of head, face and neck: Secondary | ICD-10-CM | POA: Insufficient documentation

## 2014-09-13 DIAGNOSIS — Z923 Personal history of irradiation: Secondary | ICD-10-CM | POA: Insufficient documentation

## 2014-09-13 DIAGNOSIS — R1311 Dysphagia, oral phase: Secondary | ICD-10-CM | POA: Insufficient documentation

## 2014-09-13 DIAGNOSIS — C029 Malignant neoplasm of tongue, unspecified: Secondary | ICD-10-CM | POA: Insufficient documentation

## 2014-09-14 ENCOUNTER — Telehealth: Payer: Self-pay

## 2014-09-14 NOTE — Telephone Encounter (Signed)
Per Sharon Seller, NP, I called patient to get an idea of what he needs for his appointment.  Per patient he just needs a refill on his tylenol 3.  Patient is currently taking fentanyl and morphine.  Vaughan Basta, NP does not feel comfortable filling tylenol 3 since patient is on so many other narcotics.  Advised patient to contact oncology for pain medications.

## 2014-09-15 ENCOUNTER — Ambulatory Visit: Payer: Self-pay

## 2014-09-20 ENCOUNTER — Telehealth: Payer: Self-pay | Admitting: Hematology & Oncology

## 2014-09-20 NOTE — Telephone Encounter (Signed)
Left message with 5-11 appointment °

## 2014-09-21 ENCOUNTER — Other Ambulatory Visit: Payer: Self-pay | Admitting: *Deleted

## 2014-09-21 DIAGNOSIS — C069 Malignant neoplasm of mouth, unspecified: Secondary | ICD-10-CM

## 2014-09-22 ENCOUNTER — Ambulatory Visit (HOSPITAL_BASED_OUTPATIENT_CLINIC_OR_DEPARTMENT_OTHER): Payer: No Typology Code available for payment source | Admitting: Hematology & Oncology

## 2014-09-22 ENCOUNTER — Other Ambulatory Visit (HOSPITAL_BASED_OUTPATIENT_CLINIC_OR_DEPARTMENT_OTHER): Payer: No Typology Code available for payment source

## 2014-09-22 ENCOUNTER — Encounter: Payer: Self-pay | Admitting: Hematology & Oncology

## 2014-09-22 ENCOUNTER — Ambulatory Visit (HOSPITAL_BASED_OUTPATIENT_CLINIC_OR_DEPARTMENT_OTHER): Payer: No Typology Code available for payment source

## 2014-09-22 VITALS — BP 110/69 | HR 76 | Temp 98.2°F | Resp 18 | Ht 69.0 in | Wt 122.0 lb

## 2014-09-22 DIAGNOSIS — C069 Malignant neoplasm of mouth, unspecified: Secondary | ICD-10-CM

## 2014-09-22 LAB — CBC WITH DIFFERENTIAL (CANCER CENTER ONLY)
BASO#: 0 10*3/uL (ref 0.0–0.2)
BASO%: 0.2 % (ref 0.0–2.0)
EOS%: 0.9 % (ref 0.0–7.0)
Eosinophils Absolute: 0.1 10*3/uL (ref 0.0–0.5)
HEMATOCRIT: 32.3 % — AB (ref 38.7–49.9)
HGB: 10.8 g/dL — ABNORMAL LOW (ref 13.0–17.1)
LYMPH#: 0.3 10*3/uL — AB (ref 0.9–3.3)
LYMPH%: 3.1 % — ABNORMAL LOW (ref 14.0–48.0)
MCH: 32 pg (ref 28.0–33.4)
MCHC: 33.4 g/dL (ref 32.0–35.9)
MCV: 96 fL (ref 82–98)
MONO#: 0.6 10*3/uL (ref 0.1–0.9)
MONO%: 6 % (ref 0.0–13.0)
NEUT#: 8.4 10*3/uL — ABNORMAL HIGH (ref 1.5–6.5)
NEUT%: 89.8 % — ABNORMAL HIGH (ref 40.0–80.0)
Platelets: 237 10*3/uL (ref 145–400)
RBC: 3.37 10*6/uL — AB (ref 4.20–5.70)
RDW: 12.1 % (ref 11.1–15.7)
WBC: 9.4 10*3/uL (ref 4.0–10.0)

## 2014-09-22 LAB — CMP (CANCER CENTER ONLY)
ALT: 13 U/L (ref 10–47)
AST: 19 U/L (ref 11–38)
Albumin: 3.7 g/dL (ref 3.3–5.5)
Alkaline Phosphatase: 72 U/L (ref 26–84)
BILIRUBIN TOTAL: 0.8 mg/dL (ref 0.20–1.60)
BUN: 14 mg/dL (ref 7–22)
CALCIUM: 9.8 mg/dL (ref 8.0–10.3)
CO2: 33 meq/L (ref 18–33)
CREATININE: 1 mg/dL (ref 0.6–1.2)
Chloride: 95 mEq/L — ABNORMAL LOW (ref 98–108)
GLUCOSE: 181 mg/dL — AB (ref 73–118)
Potassium: 4.1 mEq/L (ref 3.3–4.7)
Sodium: 138 mEq/L (ref 128–145)
Total Protein: 7.7 g/dL (ref 6.4–8.1)

## 2014-09-22 MED ORDER — HYDROMORPHONE HCL 4 MG/ML IJ SOLN
INTRAMUSCULAR | Status: AC
  Filled 2014-09-22: qty 1

## 2014-09-22 MED ORDER — HYDROMORPHONE HCL 4 MG/ML IJ SOLN
4.0000 mg | Freq: Once | INTRAMUSCULAR | Status: AC
  Administered 2014-09-22: 4 mg via SUBCUTANEOUS

## 2014-09-22 MED ORDER — OXYCODONE HCL 30 MG PO TABS: ORAL_TABLET | ORAL | Status: DC

## 2014-09-22 NOTE — Patient Instructions (Signed)
Hydromorphone injection What is this medicine? HYDROMORPHONE (hye droe MOR fone) is a pain reliever. It is used to treat moderate to severe pain. This medicine may be used for other purposes; ask your health care provider or pharmacist if you have questions. COMMON BRAND NAME(S): Dilaudid, Dilaudid-[place] What should I tell my health care provider before I take this medicine? They need to know if you have any of these conditions: -brain tumor -drug abuse or addiction -head injury -heart disease -frequently drink alcohol containing drinks -kidney disease -liver disease -lung disease, asthma, or breathing problems -an allergic or unusual reaction to hydromorphone, other opioid analgesics, latex, other medicines, foods, dyes, or preservatives -pregnant or trying to get pregnant -breast-feeding How should I use this medicine? This medicine is for injection into a vein, into a muscle, or under the skin. It is usually given by a health care professional in a hospital or clinic setting. If you get this medicine at home, you will be taught how to prepare and give this medicine. Use exactly as directed. Take your medicine at regular intervals. Do not take your medicine more often than directed. It is important that you put your used needles and syringes in a special sharps container. Do not put them in a trash can. If you do not have a sharps container, call your pharmacist or healthcare provider to get one. Talk to your pediatrician regarding the use of this medicine in children. This medicine is not approved for use in children. Overdosage: If you think you have taken too much of this medicine contact a poison control center or emergency room at once. NOTE: This medicine is only for you. Do not share this medicine with others. What if I miss a dose? If you miss a dose, use it as soon as you can. If it is almost time for your next dose, use only that dose. Do not use double or extra doses. What may  interact with this medicine? -alcohol -antihistamines for allergy, cough and cold -medicines for anesthesia -medicines for depression, anxiety, or psychotic disturbances -medicines for sleep -muscle relaxants -naltrexone -narcotic medicines (opiates) for pain -phenothiazines like chlorpromazine, mesoridazine, prochlorperazine, thioridazine -tramadol This list may not describe all possible interactions. Give your health care provider a list of all the medicines, herbs, non-prescription drugs, or dietary supplements you use. Also tell them if you smoke, drink alcohol, or use illegal drugs. Some items may interact with your medicine. What should I watch for while using this medicine? Tell your doctor or health care professional if your pain does not go away, if it gets worse, or if you have new or a different type of pain. You may develop tolerance to the medicine. Tolerance means that you will need a higher dose of the medicine for pain relief. Tolerance is normal and is expected if you take this medicine for a long time. Do not suddenly stop taking your medicine because you may develop a severe reaction. Your body becomes used to the medicine. This does NOT mean you are addicted. Addiction is a behavior related to getting and using a drug for a non-medical reason. If you have pain, you have a medical reason to take pain medicine. Your doctor will tell you how much medicine to take. If your doctor wants you to stop the medicine, the dose will be slowly lowered over time to avoid any side effects. You may get drowsy or dizzy. Do not drive, use machinery, or do anything that needs mental alertness until   you know how this medicine affects you. Do not stand or sit up quickly, especially if you are an older patient. This reduces the risk of dizzy or fainting spells. Alcohol may interfere with the effect of this medicine. Avoid alcoholic drinks. There are different types of narcotic medicines (opiates) for  pain. If you take more than one type at the same time, you may have more side effects. Give your health care provider a list of all medicines you use. Your doctor will tell you how much medicine to take. Do not take more medicine than directed. Call emergency for help if you have problems breathing. This medicine will cause constipation. Try to have a bowel movement at least every 2 to 3 days. If you do not have a bowel movement for 3 days, call your doctor or health care professional. Your mouth may get dry. Chewing sugarless gum or sucking hard candy, and drinking plenty of water may help. Contact your doctor if the problem does not go away or is severe. What side effects may I notice from receiving this medicine? Side effects that you should report to your doctor or health care professional as soon as possible: -allergic reactions like skin rash, itching or hives, swelling of the face, lips, or tongue -breathing problems -changes in vision -confusion -feeling faint or lightheaded, falls -seizures -slow or fast heartbeat -trouble passing urine or change in the amount of urine -trouble with balance, talking, walking -unusually weak or tired Side effects that usually do not require medical attention (report to your doctor or health care professional if they continue or are bothersome): -difficulty sleeping -drowsiness -dry mouth -flushing -headache -itching -loss of appetite -nausea, vomiting This list may not describe all possible side effects. Call your doctor for medical advice about side effects. You may report side effects to FDA at 1-800-FDA-1088. Where should I keep my medicine? Keep out of the reach of children. This medicine can be abused. Keep your medicine in a safe place to protect it from theft. Do not share this medicine with anyone. Selling or giving away this medicine is dangerous and against the law. If you are using this medicine at home, you will be instructed on how to  store this medicine. This medicine may cause accidental overdose and death if it is taken by other adults, children, or pets. Flush any unused medicine down the toilet to reduce the chance of harm. Do not use the medicine after the expiration date. NOTE: This sheet is a summary. It may not cover all possible information. If you have questions about this medicine, talk to your doctor, pharmacist, or health care provider.  2015, Elsevier/Gold Standard. (2012-12-02 10:47:33)  

## 2014-09-23 ENCOUNTER — Encounter: Payer: Self-pay | Admitting: *Deleted

## 2014-09-23 ENCOUNTER — Other Ambulatory Visit: Payer: Self-pay

## 2014-09-23 NOTE — Progress Notes (Signed)
When patient was in office on 09/22/14 he stated that he was unable to afford any food. He has a PEG tube and requires liquid supplements for nutrition. He was given 6 bottles of boost, which was the office sample supply. Dory Peru, RD, was called and she stated that she had placed orders for [facility] and to follow up with the agency to see where the referral stood.   This am, [facility] was called. They stated that the referral couldn't be found and to re-order. Called Pura Spice [phone]) and made referral for patient. He currently doesn't have insurance coverage, but [facility] does occasionally care for patients without insurance coverage who are in the process of applying for Medicaid, which Mr Naugle is. Currently, I am waiting to hear back from [facility] on their ability to care for patient.   Spoke to CSW at the main campus regarding any additional help they could provide for patient. They stated that they had provided patient with all their available resources and that he was given all the needed information regarding steps he needed to take. Asked if they could help keep track of patient to make sure they didn't 'fall through the cracks' and they stated that this would be the responsibility of this office. Also asked about any other potential nutritional help they could provide, but was referred back to this offfice's director since the patient wasn't seen at the [facility]. Stated that the pharmacy in [place] would sometimes order cases of ENSURE for the patient, however the pharmacist here at this office was unaware of the possibility of doing this here.   Called Gayleen Orem, the Head and Neck navigator at [facility]. He is minimally familiar with this patient from tumor board discussion. He was very willing to help with potentially locating resources for this patient. He is aware of a grant and will check into the possibility of Mr Tidwell receiving the grant. Will  communicate with Liliane Channel again tomorrow to see what is available. He will also continue to help with some navigation of this patient.

## 2014-09-23 NOTE — Progress Notes (Signed)
[place] Work  Clinical Social Work was referred by [employer] nurse for assessment of psychosocial needs due to financial issues and nutritional needs.  Clinical Social Worker educated nurse on role of CSW team and resources to assist pt with pending MCD. CSW has no way to speed up pending MCD or other resources to assist with his nutritional needs currently. CSW made referral to dietician, Dory Peru who made follow up call as well and put orders in for [facility]. No other CSW needs noted at this time.  Clinical Social Work interventions: Resource education to staff  Loren Racer, [place] Worker [place]  [facility] Phone: [phone] Fax: [phone]

## 2014-09-24 ENCOUNTER — Encounter: Payer: Self-pay | Admitting: *Deleted

## 2014-09-24 NOTE — Progress Notes (Signed)
[place] Work  Clinical Social Work was referred by nurse, Charlsie Merles for assessment of psychosocial needs.  Clinical Social Worker contacted patient to offer support and assess for needs.  CSW spoke to pt at length. Pt expressed concerns about transportation to appointments. Pt aware of how to access [facility], but waited too long long to access in time for Monday. Pt could use [facility], however [facility] will not go to [employer]. Pt plans to walk and catch three buses for his appointment on Monday. He plans to contact [facility] for other appointments and is aware of how to arrange independently. Transportation, however will continue to be a barrier due to not receiving care in city of residence. Pt would like to continue treatment in [place] and feels he can manage the two bus transfer currently. Family cannot be relied on for regular transportation assistance. Once pt has MCD, he can possible get additional transportation to [employer].   Pt has food stamps currently and receives $194 a month. He uses this to purchase ice cream, carnation instant breakfast and dry milk, soups and other soft foods. He reports [facility] brought his jevity and supplies last night. They brought a six day supply and he is very thankful. Pt hopes this will continue. He reports his MCD is pending and per caseworker all paperwork was submitted on Monday. There may be a 90 day wait time in getting this in place. He was also approved for ss disability and there may be a 2 month wait time for that start. Pt has been very proactive in obtaining resources as he was directed to do so by the CSW team at [facility]. CSW discussed grant program through the [facility]. Pt directed to bring letter of support and meet with Otilio Carpen at his next appointment. Fatima Sanger funds are managed through the financial counselors. This grant would be $400 towards medications, utilities, etc. This could possibly be used for nutritional needs as well, but  appears [facility] currently providing. CSW informed Otilio Carpen that pt will be meeting with her at said appointment on Monday. Pt aware to reach out to [facility] team as needed. He was very appreciative of contact. Pt appears very resourceful and doing a great job managing his concerns currently.   Clinical Social Work interventions: Resource education Supportive listening Pt advocacy  Loren Racer, [place] Worker [place]  [place] Phone: [phone] Fax: [phone]

## 2014-09-24 NOTE — Progress Notes (Signed)
Hematology and Oncology Follow Up Visit  Derek Campos [id number] Dec 11, 1970 44 y.o. 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 He was seen by ENT at [facility]. Dr. Conley Canal saw him. Unfortunately, Dr. Conley Canal really cannot help him surgically. He has metastatic disease. Dr. Conley Canal  His pain seems to be doing a little bit better. He still is having a hard time swallowing. He has a feeding tube in place.  We have monitored fentanyl patch. We will have to possibly increase this.  He is on oxycodone. He said he takes for 10 mg oxycodone at a time. He is also some Pepcid through his tube. I will adjust his oxycodone dose and have him take 30 mg talus and dissolve it.  There is a a new nasal fentanyl spray. I think this might be worthwhile for him.  I taught him about chemotherapy. I think chemotherapy is the only way that we could this point to try to help him.  He still has a decent performance status. His performance status is ECOG 1. As such, I think he could tolerate treatment.  I don't see that we have to get a biopsy on him.  He's had no bleeding. He's had no fever. He's had no cough. There's been no nausea or vomiting.  Medications:  Current outpatient prescriptions:  .  fentaNYL (DURAGESIC - DOSED MCG/HR) 75 MCG/HR, Place 1 patch (75 mcg total) onto the skin every 3 (three) days., Disp: 10 patch, Rfl: 0 .  gabapentin (NEURONTIN) 300 MG capsule, Take 2 capsules (600 mg total) by mouth 4 (four) times daily., Disp: 120 capsule, Rfl: 3 .  ibuprofen (ADVIL,MOTRIN) 200 MG tablet, Take 200 mg by mouth every 6 (six) hours as needed., Disp: , Rfl:  .  lidocaine (XYLOCAINE) 2 % solution, USE 20 MLS IN THE MOUTH OR THROAT AS NEEDED FOR MOUTH PAIN., Disp: 100 mL, Rfl: 1 .  nicotine (NICODERM CQ - DOSED IN MG/24 HOURS) 14 mg/24hr patch,  Place 1 patch (14 mg total) onto the skin daily. For 6 weeks, then apply 7mg /24 hr for 2 weeks, Disp: 56 patch, Rfl: 0 .  Nutritional Supplements (FEEDING SUPPLEMENT, JEVITY 1.2 CAL,) LIQD, Place 480 mLs into feeding tube 4 (four) times daily., Disp: , Rfl:  .  Nutritional Supplements (JEVITY 1.5 CAL PO), Take 474 mLs by mouth 3 (three) times daily., Disp: , Rfl:  .  omeprazole (PRILOSEC) 20 MG capsule, Take 20 mg by mouth daily., Disp: , Rfl:  .  polyethylene glycol (GOLYTELY) 236 G solution, Take 4,000 mLs by mouth once., Disp: 4000 mL, Rfl: 0 .  oxyCODONE (ROXICODONE) 30 MG immediate release tablet, Take 1-1/1/2 tablets, if needed, every 4 hrs for pain, Disp: 120 tablet, Rfl: 0  Allergies:  Allergies  Allergen Reactions  . Other     Cat Gut Stitches - unknown reaction.Told by parents.    Past Medical History, Surgical history, Social history, and Family History were reviewed and updated.  Review of Systems: As above  Physical Exam:  height is 5\' 9"  (1.753 m) and weight is 122 lb (55.339 kg). His oral temperature is 98.2 F (36.8 C). His blood pressure is 110/69 and his pulse is 76. His respiration is 18.   Wt Readings from Last 3 Encounters:  09/22/14 122 lb (55.339 kg)  09/08/14 122 lb (55.339 kg)  09/06/14 125 lb (56.7 kg)     Thin white gentleman. He is in no obvious distress. Head and neck exam shows his tongue to be markedly disfigured because of malignancy. 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 Surprisingly there is no adenopathy in his neck. Pupils react appropriately. Lungs are clear. Cardiac exam regular rate and rhythm with no murmurs, rubs or bruits. Abdomen is soft. Has a feeding tube as intact. There is no fluid wave. There is no palpable liver or spleen tip. Extremities shows no clubbing, cyanosis or edema. He has good strength in his extremities. He has good range of motion of his joints. Skin exam shows no rashes, ecchymoses or petechia. Neurological exam  is nonfocal.  Lab Results  Component Value Date   WBC 9.4 09/22/2014   HGB 10.8* 09/22/2014   HCT 32.3* 09/22/2014   MCV 96 09/22/2014   PLT 237 09/22/2014     Chemistry      Component Value Date/Time   NA 138 09/22/2014 1426   NA 136 09/07/2014 0031   K 4.1 09/22/2014 1426   K 3.9 09/07/2014 0031   CL 95* 09/22/2014 1426   CL 98 09/07/2014 0031   CO2 33 09/22/2014 1426   CO2 31 09/07/2014 0031   BUN 14 09/22/2014 1426   BUN 18 09/07/2014 0031   CREATININE 1.0 09/22/2014 1426   CREATININE 0.73 09/07/2014 0031      Component Value Date/Time   CALCIUM 9.8 09/22/2014 1426   CALCIUM 9.0 09/07/2014 0031   ALKPHOS 72 09/22/2014 1426   AST 19 09/22/2014 1426   ALT 13 09/22/2014 1426   BILITOT 0.80 09/22/2014 1426         Impression and Plan: Derek Campos is a 44 year old white gentleman with metastatic head and neck cancer.  He is ready had chemotherapy and radiation therapy for his locally advanced disease. This is down in [place]. He got weekly low-dose cis-platinum with radiation.  I think that he would respond to systemic full dose chemotherapy. I think that carboplatinum/Taxotere/Erbitux would be appropriate. I think response rate should be over 50%.  He knows that treatment is not going to cure him. It is possible that we could at least get significant tumor regression so that he might be able to benefit from surgery for symptoms.  I went over the chemotherapy with him. I gave him some information sheets. I told him about the risk of infection, hair loss, rash from the Erbitux. He will need Neulasta.  He does not need a Port-A-Cath. He seems to have good peripheral access.  He agrees to therapy. He wants to try to get started seeing. I'll see about getting started next week.  I spent about an hour with him. I answered all his questions. We refill some medications. We went over the chemotherapy protocol.  I want to make sure he comes back, weeks after treatment  so we can see how he is doing.   Volanda Napoleon, MD 5/13/20167:38 AM

## 2014-09-27 ENCOUNTER — Other Ambulatory Visit (HOSPITAL_BASED_OUTPATIENT_CLINIC_OR_DEPARTMENT_OTHER): Payer: No Typology Code available for payment source

## 2014-09-27 ENCOUNTER — Ambulatory Visit: Payer: Self-pay

## 2014-09-27 ENCOUNTER — Other Ambulatory Visit: Payer: Self-pay | Admitting: *Deleted

## 2014-09-27 ENCOUNTER — Ambulatory Visit (HOSPITAL_BASED_OUTPATIENT_CLINIC_OR_DEPARTMENT_OTHER): Payer: No Typology Code available for payment source

## 2014-09-27 ENCOUNTER — Telehealth: Payer: Self-pay | Admitting: Nutrition

## 2014-09-27 VITALS — BP 122/71 | HR 72 | Temp 98.0°F

## 2014-09-27 DIAGNOSIS — C069 Malignant neoplasm of mouth, unspecified: Secondary | ICD-10-CM

## 2014-09-27 DIAGNOSIS — Z5111 Encounter for antineoplastic chemotherapy: Secondary | ICD-10-CM

## 2014-09-27 DIAGNOSIS — C760 Malignant neoplasm of head, face and neck: Secondary | ICD-10-CM

## 2014-09-27 DIAGNOSIS — Z5189 Encounter for other specified aftercare: Secondary | ICD-10-CM

## 2014-09-27 DIAGNOSIS — Z85818 Personal history of malignant neoplasm of other sites of lip, oral cavity, and pharynx: Secondary | ICD-10-CM

## 2014-09-27 LAB — MAGNESIUM (CC13): Magnesium: 2.4 mg/dl (ref 1.5–2.5)

## 2014-09-27 MED ORDER — DEXAMETHASONE 4 MG PO TABS: 8.0000 mg | ORAL_TABLET | Freq: Two times a day (BID) | ORAL | Status: DC

## 2014-09-27 MED ORDER — DOCETAXEL CHEMO INJECTION 160 MG/16ML
65.0000 mg/m2 | Freq: Once | INTRAVENOUS | Status: AC
  Administered 2014-09-27: 110 mg via INTRAVENOUS
  Filled 2014-09-27: qty 11

## 2014-09-27 MED ORDER — ONDANSETRON HCL 8 MG PO TABS: 8.0000 mg | ORAL_TABLET | Freq: Two times a day (BID) | ORAL | Status: DC

## 2014-09-27 MED ORDER — SODIUM CHLORIDE 0.9 % IV SOLN
550.0000 mg | Freq: Once | INTRAVENOUS | Status: AC
  Administered 2014-09-27: 550 mg via INTRAVENOUS
  Filled 2014-09-27: qty 55

## 2014-09-27 MED ORDER — SODIUM CHLORIDE 0.9 % IV SOLN
Freq: Once | INTRAVENOUS | Status: AC
  Administered 2014-09-27: 14:00:00 via INTRAVENOUS
  Filled 2014-09-27: qty 8

## 2014-09-27 MED ORDER — LIDOCAINE VISCOUS 2 % MT SOLN: 20.0000 mL | OROMUCOSAL | Status: DC | PRN

## 2014-09-27 MED ORDER — SODIUM CHLORIDE 0.9 % IV SOLN
Freq: Once | INTRAVENOUS | Status: AC
  Administered 2014-09-27: 13:00:00 via INTRAVENOUS

## 2014-09-27 MED ORDER — DOXYCYCLINE HYCLATE 100 MG PO CAPS: 100.0000 mg | ORAL_CAPSULE | Freq: Two times a day (BID) | ORAL | Status: DC

## 2014-09-27 MED ORDER — PROCHLORPERAZINE MALEATE 10 MG PO TABS: 10.0000 mg | ORAL_TABLET | Freq: Four times a day (QID) | ORAL | Status: DC | PRN

## 2014-09-27 MED ORDER — DIPHENHYDRAMINE HCL 50 MG/ML IJ SOLN
INTRAMUSCULAR | Status: AC
  Filled 2014-09-27: qty 1

## 2014-09-27 MED ORDER — PEGFILGRASTIM 6 MG/0.6ML ~~LOC~~ PSKT
6.0000 mg | PREFILLED_SYRINGE | Freq: Once | SUBCUTANEOUS | Status: AC
  Administered 2014-09-27: 6 mg via SUBCUTANEOUS
  Filled 2014-09-27: qty 0.6

## 2014-09-27 MED ORDER — HYDROCORTISONE 1 % EX CREA: 1.0000 "application " | TOPICAL_CREAM | Freq: Every day | CUTANEOUS | Status: DC

## 2014-09-27 MED ORDER — CETUXIMAB CHEMO IV INJECTION 200 MG/100ML
400.0000 mg/m2 | Freq: Once | INTRAVENOUS | Status: AC
  Administered 2014-09-27: 700 mg via INTRAVENOUS
  Filled 2014-09-27: qty 350

## 2014-09-27 MED ORDER — LORAZEPAM 0.5 MG PO TABS: 0.5000 mg | ORAL_TABLET | Freq: Four times a day (QID) | ORAL | Status: DC | PRN

## 2014-09-27 MED ORDER — DIPHENHYDRAMINE HCL 50 MG/ML IJ SOLN
50.0000 mg | Freq: Once | INTRAMUSCULAR | Status: AC
  Administered 2014-09-27: 50 mg via INTRAVENOUS

## 2014-09-27 NOTE — Patient Instructions (Addendum)
SKIN CARE  Please apply ski moisturizer (Eucerin Cream) to face, hands, feet, neck, back, and chest daily every morning. Uses Sunscreen (SPF >15) before going outdoors.  Apply Hydrocortisone 1% (apply to same areas as the Eucerin) at bedtime.    [facility] Discharge Instructions for Patients Receiving Chemotherapy  Today you received the following chemotherapy agents Erbitux, Carboplatin, Taxotere  To help prevent nausea and vomiting after your treatment, we encourage you to take your nausea medication  1) Zofran (Ondansetron) 8 mg.  Take 1 tablet in the morning and one in the evening beginning day after chemotherapy.  Take for 3 days.  2) Decadron - Continue taking Decadron for 3 days after chemotherapy.  3) Compazine (Prochlorperazine) Take 1 tablet by mouth every 6 hours AS NEEDED for nausea.     If you develop nausea and vomiting that is not controlled by your nausea medication, call the clinic.   BELOW ARE SYMPTOMS THAT SHOULD BE REPORTED IMMEDIATELY:  *FEVER GREATER THAN 100.5 F  *CHILLS WITH OR WITHOUT FEVER  NAUSEA AND VOMITING THAT IS NOT CONTROLLED WITH YOUR NAUSEA MEDICATION  *UNUSUAL SHORTNESS OF BREATH  *UNUSUAL BRUISING OR BLEEDING  TENDERNESS IN MOUTH AND THROAT WITH OR WITHOUT PRESENCE OF ULCERS  *URINARY PROBLEMS  *BOWEL PROBLEMS  UNUSUAL RASH Items with * indicate a potential emergency and should be followed up as soon as possible.  Feel free to call the clinic you have any questions or concerns. The clinic phone number is [phone].  Please show the [facility] at check-in to the Emergency Department and triage nurse.      Cetuximab injection What is this medicine? CETUXIMAB (se TUX i mab) is a chemotherapy drug. It targets a specific protein within cancer cells and stops the cells from growing. It is used to treat colorectal cancer and head and neck cancer. This medicine may be used for other purposes; ask your health  care provider or pharmacist if you have questions. COMMON BRAND NAME(S): Erbitux What should I tell my health care provider before I take this medicine? They need to know if you have any of these conditions: -heart disease -history of irregular heartbeat -history of low levels of calcium, magnesium, or potassium in the blood -lung or breathing disease, like asthma -an unusual or allergic reaction to cetuximab, other medicines, foods, dyes, or preservatives -pregnant or trying to get pregnant -breast-feeding How should I use this medicine? This drug is given as an infusion into a vein. It is administered in a hospital or clinic by a specially trained health care professional. Talk to your pediatrician regarding the use of this medicine in children. Special care may be needed. Overdosage: If you think you have taken too much of this medicine contact a poison control center or emergency room at once. NOTE: This medicine is only for you. Do not share this medicine with others. What if I miss a dose? It is important not to miss your dose. Call your doctor or health care professional if you are unable to keep an appointment. What may interact with this medicine? Interactions are not expected. This list may not describe all possible interactions. Give your health care provider a list of all the medicines, herbs, non-prescription drugs, or dietary supplements you use. Also tell them if you smoke, drink alcohol, or use illegal drugs. Some items may interact with your medicine. What should I watch for while using this medicine? Visit your doctor or health care professional for regular  checks on your progress. This drug may make you feel generally unwell. This is not uncommon, as chemotherapy can affect healthy cells as well as cancer cells. Report any side effects. Continue your course of treatment even though you feel ill unless your doctor tells you to stop. This medicine can make you more sensitive  to the sun. Keep out of the sun while taking this medicine and for 2 months after the last dose. If you cannot avoid being in the sun, wear protective clothing and use sunscreen. Do not use sun lamps or tanning beds/booths. You may need blood work done while you are taking this medicine. In some cases, you may be given additional medicines to help with side effects. Follow all directions for their use. Call your doctor or health care professional for advice if you get a fever, chills or sore throat, or other symptoms of a cold or flu. Do not treat yourself. This drug decreases your body's ability to fight infections. Try to avoid being around people who are sick. Avoid taking products that contain aspirin, acetaminophen, ibuprofen, naproxen, or ketoprofen unless instructed by your doctor. These medicines may hide a fever. Do not become pregnant while taking this medicine. Women should inform their doctor if they wish to become pregnant or think they might be pregnant. There is a potential for serious side effects to an unborn child. Use adequate birth control methods. Avoid pregnancy for at least 6 months after your last dose. Talk to your health care professional or pharmacist for more information. Do not breast-feed an infant while taking this medicine or during the 2 months after your last dose. What side effects may I notice from receiving this medicine? Side effects that you should report to your doctor or health care professional as soon as possible: -allergic reactions like skin rash, itching or hives, swelling of the face, lips, or tongue -breathing problems -changes in vision -fast, irregular heartbeat -feeling faint or lightheaded, falls -fever, chills -mouth sores -redness, blistering, peeling or loosening of the skin, including inside the mouth -trouble passing urine or change in the amount of urine -unusually weak or tired Side effects that usually do not require medical attention  (report to your doctor or health care professional if they continue or are bothersome): -changes in skin like acne, cracks, skin dryness -constipation -diarrhea -headache -nail changes -nausea, vomiting -stomach upset -weight loss This list may not describe all possible side effects. Call your doctor for medical advice about side effects. You may report side effects to FDA at 1-800-FDA-1088. Where should I keep my medicine? This drug is given in a hospital or clinic and will not be stored at home. NOTE: This sheet is a summary. It may not cover all possible information. If you have questions about this medicine, talk to your doctor, pharmacist, or health care provider.  2015, [employer]. (2013-08-12 16:14:34)  Carboplatin injection What is this medicine? CARBOPLATIN (KAR boe pla tin) is a chemotherapy drug. It targets fast dividing cells, like cancer cells, and causes these cells to die. This medicine is used to treat ovarian cancer and many other cancers. This medicine may be used for other purposes; ask your health care provider or pharmacist if you have questions. COMMON BRAND NAME(S): Paraplatin What should I tell my health care provider before I take this medicine? They need to know if you have any of these conditions: -blood disorders -hearing problems -kidney disease -recent or ongoing radiation therapy -an unusual or allergic reaction to  carboplatin, cisplatin, other chemotherapy, other medicines, foods, dyes, or preservatives -pregnant or trying to get pregnant -breast-feeding How should I use this medicine? This drug is usually given as an infusion into a vein. It is administered in a hospital or clinic by a specially trained health care professional. Talk to your pediatrician regarding the use of this medicine in children. Special care may be needed. Overdosage: If you think you have taken too much of this medicine contact a poison control center or emergency  room at once. NOTE: This medicine is only for you. Do not share this medicine with others. What if I miss a dose? It is important not to miss a dose. Call your doctor or health care professional if you are unable to keep an appointment. What may interact with this medicine? -medicines for seizures -medicines to increase blood counts like filgrastim, pegfilgrastim, sargramostim -some antibiotics like amikacin, gentamicin, neomycin, streptomycin, tobramycin -vaccines Talk to your doctor or health care professional before taking any of these medicines: -acetaminophen -aspirin -ibuprofen -ketoprofen -naproxen This list may not describe all possible interactions. Give your health care provider a list of all the medicines, herbs, non-prescription drugs, or dietary supplements you use. Also tell them if you smoke, drink alcohol, or use illegal drugs. Some items may interact with your medicine. What should I watch for while using this medicine? Your condition will be monitored carefully while you are receiving this medicine. You will need important blood work done while you are taking this medicine. This drug may make you feel generally unwell. This is not uncommon, as chemotherapy can affect healthy cells as well as cancer cells. Report any side effects. Continue your course of treatment even though you feel ill unless your doctor tells you to stop. In some cases, you may be given additional medicines to help with side effects. Follow all directions for their use. Call your doctor or health care professional for advice if you get a fever, chills or sore throat, or other symptoms of a cold or flu. Do not treat yourself. This drug decreases your body's ability to fight infections. Try to avoid being around people who are sick. This medicine may increase your risk to bruise or bleed. Call your doctor or health care professional if you notice any unusual bleeding. Be careful brushing and flossing your  teeth or using a toothpick because you may get an infection or bleed more easily. If you have any dental work done, tell your dentist you are receiving this medicine. Avoid taking products that contain aspirin, acetaminophen, ibuprofen, naproxen, or ketoprofen unless instructed by your doctor. These medicines may hide a fever. Do not become pregnant while taking this medicine. Women should inform their doctor if they wish to become pregnant or think they might be pregnant. There is a potential for serious side effects to an unborn child. Talk to your health care professional or pharmacist for more information. Do not breast-feed an infant while taking this medicine. What side effects may I notice from receiving this medicine? Side effects that you should report to your doctor or health care professional as soon as possible: -allergic reactions like skin rash, itching or hives, swelling of the face, lips, or tongue -signs of infection - fever or chills, cough, sore throat, pain or difficulty passing urine -signs of decreased platelets or bleeding - bruising, pinpoint red spots on the skin, black, tarry stools, nosebleeds -signs of decreased red blood cells - unusually weak or tired, fainting spells, lightheadedness -breathing problems -  changes in hearing -changes in vision -chest pain -high blood pressure -low blood counts - This drug may decrease the number of white blood cells, red blood cells and platelets. You may be at increased risk for infections and bleeding. -nausea and vomiting -pain, swelling, redness or irritation at the injection site -pain, tingling, numbness in the hands or feet -problems with balance, talking, walking -trouble passing urine or change in the amount of urine Side effects that usually do not require medical attention (report to your doctor or health care professional if they continue or are bothersome): -hair loss -loss of appetite -metallic taste in the mouth or  changes in taste This list may not describe all possible side effects. Call your doctor for medical advice about side effects. You may report side effects to FDA at 1-800-FDA-1088. Where should I keep my medicine? This drug is given in a hospital or clinic and will not be stored at home. NOTE: This sheet is a summary. It may not cover all possible information. If you have questions about this medicine, talk to your doctor, pharmacist, or health care provider.  2015, [employer] Standard. (2007-08-05 14:38:05)   Docetaxel injection What is this medicine? DOCETAXEL (doe se TAX el) is a chemotherapy drug. It targets fast dividing cells, like cancer cells, and causes these cells to die. This medicine is used to treat many types of cancers like breast cancer, certain stomach cancers, head and neck cancer, lung cancer, and prostate cancer. This medicine may be used for other purposes; ask your health care provider or pharmacist if you have questions. COMMON BRAND NAME(S): Docefrez, Taxotere What should I tell my health care provider before I take this medicine? They need to know if you have any of these conditions: -infection (especially a virus infection such as chickenpox, cold sores, or herpes) -liver disease -low blood counts, like low white cell, platelet, or red cell counts -an unusual or allergic reaction to docetaxel, polysorbate 80, other chemotherapy agents, other medicines, foods, dyes, or preservatives -pregnant or trying to get pregnant -breast-feeding How should I use this medicine? This drug is given as an infusion into a vein. It is administered in a hospital or clinic by a specially trained health care professional. Talk to your pediatrician regarding the use of this medicine in children. Special care may be needed. Overdosage: If you think you have taken too much of this medicine contact a poison control center or emergency room at once. NOTE: This medicine is only for you. Do  not share this medicine with others. What if I miss a dose? It is important not to miss your dose. Call your doctor or health care professional if you are unable to keep an appointment. What may interact with this medicine? -cyclosporine -erythromycin -ketoconazole -medicines to increase blood counts like filgrastim, pegfilgrastim, sargramostim -vaccines Talk to your doctor or health care professional before taking any of these medicines: -acetaminophen -aspirin -ibuprofen -ketoprofen -naproxen This list may not describe all possible interactions. Give your health care provider a list of all the medicines, herbs, non-prescription drugs, or dietary supplements you use. Also tell them if you smoke, drink alcohol, or use illegal drugs. Some items may interact with your medicine. What should I watch for while using this medicine? Your condition will be monitored carefully while you are receiving this medicine. You will need important blood work done while you are taking this medicine. This drug may make you feel generally unwell. This is not uncommon, as chemotherapy can affect  healthy cells as well as cancer cells. Report any side effects. Continue your course of treatment even though you feel ill unless your doctor tells you to stop. In some cases, you may be given additional medicines to help with side effects. Follow all directions for their use. Call your doctor or health care professional for advice if you get a fever, chills or sore throat, or other symptoms of a cold or flu. Do not treat yourself. This drug decreases your body's ability to fight infections. Try to avoid being around people who are sick. This medicine may increase your risk to bruise or bleed. Call your doctor or health care professional if you notice any unusual bleeding. Be careful brushing and flossing your teeth or using a toothpick because you may get an infection or bleed more easily. If you have any dental work done,  tell your dentist you are receiving this medicine. Avoid taking products that contain aspirin, acetaminophen, ibuprofen, naproxen, or ketoprofen unless instructed by your doctor. These medicines may hide a fever. This medicine contains an alcohol in the product. You may get drowsy or dizzy. Do not drive, use machinery, or do anything that needs mental alertness until you know how this medicine affects you. Do not stand or sit up quickly, especially if you are an older patient. This reduces the risk of dizzy or fainting spells. Avoid alcoholic drinks Do not become pregnant while taking this medicine. Women should inform their doctor if they wish to become pregnant or think they might be pregnant. There is a potential for serious side effects to an unborn child. Talk to your health care professional or pharmacist for more information. Do not breast-feed an infant while taking this medicine. What side effects may I notice from receiving this medicine? Side effects that you should report to your doctor or health care professional as soon as possible: -allergic reactions like skin rash, itching or hives, swelling of the face, lips, or tongue -low blood counts - This drug may decrease the number of white blood cells, red blood cells and platelets. You may be at increased risk for infections and bleeding. -signs of infection - fever or chills, cough, sore throat, pain or difficulty passing urine -signs of decreased platelets or bleeding - bruising, pinpoint red spots on the skin, black, tarry stools, nosebleeds -signs of decreased red blood cells - unusually weak or tired, fainting spells, lightheadedness -breathing problems -fast or irregular heartbeat -low blood pressure -mouth sores -nausea and vomiting -pain, swelling, redness or irritation at the injection site -pain, tingling, numbness in the hands or feet -swelling of the ankle, feet, hands -weight gain Side effects that usually do not require  medical attention (report to your prescriber or health care professional if they continue or are bothersome): -bone pain -complete hair loss including hair on your head, underarms, pubic hair, eyebrows, and eyelashes -diarrhea -excessive tearing -changes in the color of fingernails -loosening of the fingernails -nausea -muscle pain -red flush to skin -sweating -weak or tired This list may not describe all possible side effects. Call your doctor for medical advice about side effects. You may report side effects to FDA at 1-800-FDA-1088. Where should I keep my medicine? This drug is given in a hospital or clinic and will not be stored at home. NOTE: This sheet is a summary. It may not cover all possible information. If you have questions about this medicine, talk to your doctor, pharmacist, or health care provider.  2015, [employer]. (2013-03-26 22:21:02)

## 2014-09-27 NOTE — Telephone Encounter (Signed)
Contacted patient by phone to be sure he received tube feedings as ordered.  Patient was not available so I left a message for him to return my call.

## 2014-09-28 ENCOUNTER — Telehealth: Payer: Self-pay | Admitting: *Deleted

## 2014-09-28 NOTE — Telephone Encounter (Signed)
Spoke with patient and he is doing well. He has no complaints or symptoms. He has his medications and taking as prescribed.   If he has any questions or concerns he will call the office. [facility] has also been in touch with patient and is helping with food supply.

## 2014-10-04 ENCOUNTER — Ambulatory Visit: Payer: Self-pay

## 2014-10-04 ENCOUNTER — Ambulatory Visit (HOSPITAL_BASED_OUTPATIENT_CLINIC_OR_DEPARTMENT_OTHER): Payer: No Typology Code available for payment source

## 2014-10-04 ENCOUNTER — Encounter: Payer: Self-pay | Admitting: Family

## 2014-10-04 ENCOUNTER — Other Ambulatory Visit: Payer: Self-pay | Admitting: *Deleted

## 2014-10-04 ENCOUNTER — Ambulatory Visit: Payer: Self-pay | Admitting: Family

## 2014-10-04 ENCOUNTER — Other Ambulatory Visit (HOSPITAL_BASED_OUTPATIENT_CLINIC_OR_DEPARTMENT_OTHER): Payer: No Typology Code available for payment source

## 2014-10-04 ENCOUNTER — Ambulatory Visit (HOSPITAL_BASED_OUTPATIENT_CLINIC_OR_DEPARTMENT_OTHER): Payer: No Typology Code available for payment source | Admitting: Family

## 2014-10-04 ENCOUNTER — Other Ambulatory Visit: Payer: Self-pay

## 2014-10-04 VITALS — BP 106/68 | HR 74 | Temp 97.6°F | Wt 121.0 lb

## 2014-10-04 DIAGNOSIS — C760 Malignant neoplasm of head, face and neck: Secondary | ICD-10-CM

## 2014-10-04 DIAGNOSIS — Z5112 Encounter for antineoplastic immunotherapy: Secondary | ICD-10-CM

## 2014-10-04 DIAGNOSIS — B370 Candidal stomatitis: Secondary | ICD-10-CM

## 2014-10-04 DIAGNOSIS — C069 Malignant neoplasm of mouth, unspecified: Secondary | ICD-10-CM

## 2014-10-04 LAB — CMP (CANCER CENTER ONLY)
ALK PHOS: 110 U/L — AB (ref 26–84)
ALT(SGPT): 18 U/L (ref 10–47)
AST: 23 U/L (ref 11–38)
Albumin: 3.9 g/dL (ref 3.3–5.5)
BUN: 24 mg/dL — AB (ref 7–22)
CALCIUM: 9.5 mg/dL (ref 8.0–10.3)
CO2: 31 mEq/L (ref 18–33)
CREATININE: 1 mg/dL (ref 0.6–1.2)
Chloride: 94 mEq/L — ABNORMAL LOW (ref 98–108)
Glucose, Bld: 94 mg/dL (ref 73–118)
Potassium: 4 mEq/L (ref 3.3–4.7)
Sodium: 135 mEq/L (ref 128–145)
Total Bilirubin: 0.9 mg/dl (ref 0.20–1.60)
Total Protein: 7.4 g/dL (ref 6.4–8.1)

## 2014-10-04 LAB — MAGNESIUM (CC13): Magnesium: 2.2 mg/dl (ref 1.5–2.5)

## 2014-10-04 LAB — CBC WITH DIFFERENTIAL (CANCER CENTER ONLY)
BASO#: 0 10*3/uL (ref 0.0–0.2)
BASO%: 0.1 % (ref 0.0–2.0)
EOS%: 0.2 % (ref 0.0–7.0)
Eosinophils Absolute: 0 10*3/uL (ref 0.0–0.5)
HEMATOCRIT: 29.2 % — AB (ref 38.7–49.9)
HGB: 9.9 g/dL — ABNORMAL LOW (ref 13.0–17.1)
LYMPH#: 0.2 10*3/uL — ABNORMAL LOW (ref 0.9–3.3)
LYMPH%: 1.6 % — ABNORMAL LOW (ref 14.0–48.0)
MCH: 31.8 pg (ref 28.0–33.4)
MCHC: 33.9 g/dL (ref 32.0–35.9)
MCV: 94 fL (ref 82–98)
MONO#: 1.6 10*3/uL — ABNORMAL HIGH (ref 0.1–0.9)
MONO%: 13.9 % — AB (ref 0.0–13.0)
NEUT#: 10 10*3/uL — ABNORMAL HIGH (ref 1.5–6.5)
NEUT%: 84.2 % — AB (ref 40.0–80.0)
Platelets: 207 10*3/uL (ref 145–400)
RBC: 3.11 10*6/uL — AB (ref 4.20–5.70)
RDW: 11.9 % (ref 11.1–15.7)
WBC: 11.8 10*3/uL — ABNORMAL HIGH (ref 4.0–10.0)

## 2014-10-04 MED ORDER — DIPHENHYDRAMINE HCL 50 MG/ML IJ SOLN
50.0000 mg | Freq: Once | INTRAMUSCULAR | Status: AC
  Administered 2014-10-04: 50 mg via INTRAVENOUS

## 2014-10-04 MED ORDER — FENTANYL 75 MCG/HR TD PT72: 75.0000 ug | MEDICATED_PATCH | TRANSDERMAL | Status: DC

## 2014-10-04 MED ORDER — DIPHENHYDRAMINE HCL 50 MG/ML IJ SOLN
INTRAMUSCULAR | Status: AC
  Filled 2014-10-04: qty 1

## 2014-10-04 MED ORDER — SODIUM CHLORIDE 0.9 % IV SOLN
Freq: Once | INTRAVENOUS | Status: AC
  Administered 2014-10-04: 12:00:00 via INTRAVENOUS

## 2014-10-04 MED ORDER — CETUXIMAB CHEMO IV INJECTION 200 MG/100ML
250.0000 mg/m2 | Freq: Once | INTRAVENOUS | Status: AC
  Administered 2014-10-04: 400 mg via INTRAVENOUS
  Filled 2014-10-04: qty 200

## 2014-10-04 MED ORDER — HEPARIN SOD (PORK) LOCK FLUSH 100 UNIT/ML IV SOLN
500.0000 [IU] | Freq: Once | INTRAVENOUS | Status: DC | PRN
  Filled 2014-10-04: qty 5

## 2014-10-04 MED ORDER — NYSTATIN 100000 UNIT/ML MT SUSP: 5.0000 mL | Freq: Four times a day (QID) | OROMUCOSAL | Status: DC

## 2014-10-04 MED ORDER — SODIUM CHLORIDE 0.9 % IJ SOLN
10.0000 mL | INTRAMUSCULAR | Status: DC | PRN
  Filled 2014-10-04: qty 10

## 2014-10-04 NOTE — Progress Notes (Signed)
Hematology and Oncology Follow Up Visit  Derek Campos [id number] 25-Jun-1970 44 y.o. 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 He did well with his first cycle of chemo. He has had some mouth irritation and appears to have oral candidiasis. We will start him on some Nystatin swish and swallow. His pains seems to be under control at this time.  He denies fatigue, fever, chills, n/v, cough, rash, dizziness, SOB, chest pain, palpitations, abdominal pain, constipation, diarrhea, blood in urine or stool.  He has some submandibular lymphadenopathy.  He is feeding and hydrating himself through his J-tube. His weight is stable at 121 lbs. He has had no swelling, tenderness, numbness or tingling in his extremities. No new aches or pains.    Medications:    Medication List       This list is accurate as of: 10/04/14 11:21 AM.  Always use your most recent med list.               dexamethasone 4 MG tablet  Commonly known as:  DECADRON  Take 2 tablets (8 mg total) by mouth 2 (two) times daily. Start the day before Taxotere. Then again the day after chemo for 3 days.     doxycycline 100 MG capsule  Commonly known as:  VIBRAMYCIN  Take 1 capsule (100 mg total) by mouth 2 (two) times daily.     feeding supplement (JEVITY 1.2 CAL) Liqd  Place 480 mLs into feeding tube 4 (four) times daily.     JEVITY 1.5 CAL PO  Take 474 mLs by mouth 3 (three) times daily.     fentaNYL 75 MCG/HR  Commonly known as:  DURAGESIC - dosed mcg/hr  Place 1 patch (75 mcg total) onto the skin every 3 (three) days.     gabapentin 300 MG capsule  Commonly known as:  NEURONTIN  Take 2 capsules (600 mg total) by mouth 4 (four) times daily.     hydrocortisone cream 1 %  Apply 1 application topically at bedtime. Apply to  face, hands, feet, neck, back and chest daily at bedtime     ibuprofen 200 MG tablet  Commonly known as:  ADVIL,MOTRIN  Take 200 mg by mouth every 6 (six) hours as needed.     lidocaine 2 % solution  Commonly known as:  XYLOCAINE  Use as directed 20 mLs in the mouth or throat every 3 (three) hours as needed for mouth pain.     LORazepam 0.5 MG tablet  Commonly known as:  ATIVAN  Take 1 tablet (0.5 mg total) by mouth every 6 (six) hours as needed (Nausea or vomiting).     nicotine 14 mg/24hr patch  Commonly known as:  NICODERM CQ - dosed in mg/24 hours  Place 1 patch (14 mg total) onto the skin daily. For 6 weeks, then apply 7mg /24 hr for 2 weeks     nystatin 100000 UNIT/ML suspension  Commonly known as:  MYCOSTATIN  Take 5 mLs (500,000 Units total) by mouth 4 (four) times daily.     omeprazole 20 MG capsule  Commonly known as:  PRILOSEC  Take 20 mg by mouth daily.     ondansetron 8 MG tablet  Commonly known as:  ZOFRAN  Take 1 tablet (8 mg total) by mouth 2 (two) times  daily. Start the day after chemo for 3 days. Then take as needed for nausea or vomiting.     oxycodone 30 MG immediate release tablet  Commonly known as:  ROXICODONE  Take 1-1/1/2 tablets, if needed, every 4 hrs for pain     polyethylene glycol 236 G solution  Commonly known as:  GOLYTELY  Take 4,000 mLs by mouth once.     prochlorperazine 10 MG tablet  Commonly known as:  COMPAZINE  Take 1 tablet (10 mg total) by mouth every 6 (six) hours as needed (Nausea or vomiting).        Allergies:  Allergies  Allergen Reactions  . Other     Cat Gut Stitches - unknown reaction.Told by parents.    Past Medical History, Surgical history, Social history, and Family History were reviewed and updated.  Review of Systems: All other 10 point review of systems is negative.   Physical Exam:  weight is 121 lb (54.885 kg). His oral temperature is 97.6 F (36.4 C). His blood pressure is 106/68 and his pulse is 74.     Wt Readings from Last 3 Encounters:  10/04/14 121 lb (54.885 kg)  09/22/14 122 lb (55.339 kg)  09/08/14 122 lb (55.339 kg)    Ocular: Sclerae unicteric, pupils equal, round and reactive to light Ear-nose-throat: Oropharynx clear, dentition fair Lymphatic: No cervical or supraclavicular adenopathy Lungs no rales or rhonchi, good excursion bilaterally Heart regular rate and rhythm, no murmur appreciated Abd soft, nontender, positive bowel sounds MSK no focal spinal tenderness, no joint edema Neuro: non-focal, well-oriented, appropriate affect  Lab Results  Component Value Date   WBC 11.8* 10/04/2014   HGB 9.9* 10/04/2014   HCT 29.2* 10/04/2014   MCV 94 10/04/2014   PLT 207 10/04/2014   No results found for: FERRITIN, IRON, TIBC, UIBC, IRONPCTSAT Lab Results  Component Value Date   RBC 3.11* 10/04/2014   No results found for: KPAFRELGTCHN, LAMBDASER, KAPLAMBRATIO No results found for: IGGSERUM, IGA, IGMSERUM No results found for: Odetta Pink, SPEI   Chemistry      Component Value Date/Time   NA 138 09/22/2014 1426   NA 136 09/07/2014 0031   K 4.1 09/22/2014 1426   K 3.9 09/07/2014 0031   CL 95* 09/22/2014 1426   CL 98 09/07/2014 0031   CO2 33 09/22/2014 1426   CO2 31 09/07/2014 0031   BUN 14 09/22/2014 1426   BUN 18 09/07/2014 0031   CREATININE 1.0 09/22/2014 1426   CREATININE 0.73 09/07/2014 0031      Component Value Date/Time   CALCIUM 9.8 09/22/2014 1426   CALCIUM 9.0 09/07/2014 0031   ALKPHOS 72 09/22/2014 1426   AST 19 09/22/2014 1426   ALT 13 09/22/2014 1426   BILITOT 0.80 09/22/2014 1426     Impression and Plan: Derek Campos is a 44 year old white gentleman with metastatic head and neck cancer. He was initially completed treatment with low-dose Cisplatin with radiation in [place] in January 2016. He has now recurred and we are treating him with full dose Carboplatinum/Taxotere/Erbitux. He  completed cycle 1 without experiencing any major side effects. He has oral candidiasis so we will start him on Nystatin swish and swallow QID.  I also refilled his Duragesic patch prescription.  We will proceed with day 1, cycle 2 today as planned.  He knows to contact us with any questions or concerns. We can certainly bring him in sooner if need be.   [place],SARAH  M, NP 5/23/201611:21 AM

## 2014-10-04 NOTE — Patient Instructions (Signed)
Cetuximab injection What is this medicine? CETUXIMAB (se TUX i mab) is a chemotherapy drug. It targets a specific protein within cancer cells and stops the cells from growing. It is used to treat colorectal cancer and head and neck cancer. This medicine may be used for other purposes; ask your health care provider or pharmacist if you have questions. COMMON BRAND NAME(S): Erbitux What should I tell my health care provider before I take this medicine? They need to know if you have any of these conditions: -heart disease -history of irregular heartbeat -history of low levels of calcium, magnesium, or potassium in the blood -lung or breathing disease, like asthma -an unusual or allergic reaction to cetuximab, other medicines, foods, dyes, or preservatives -pregnant or trying to get pregnant -breast-feeding How should I use this medicine? This drug is given as an infusion into a vein. It is administered in a hospital or clinic by a specially trained health care professional. Talk to your pediatrician regarding the use of this medicine in children. Special care may be needed. Overdosage: If you think you have taken too much of this medicine contact a poison control center or emergency room at once. NOTE: This medicine is only for you. Do not share this medicine with others. What if I miss a dose? It is important not to miss your dose. Call your doctor or health care professional if you are unable to keep an appointment. What may interact with this medicine? Interactions are not expected. This list may not describe all possible interactions. Give your health care provider a list of all the medicines, herbs, non-prescription drugs, or dietary supplements you use. Also tell them if you smoke, drink alcohol, or use illegal drugs. Some items may interact with your medicine. What should I watch for while using this medicine? Visit your doctor or health care professional for regular checks on your  progress. This drug may make you feel generally unwell. This is not uncommon, as chemotherapy can affect healthy cells as well as cancer cells. Report any side effects. Continue your course of treatment even though you feel ill unless your doctor tells you to stop. This medicine can make you more sensitive to the sun. Keep out of the sun while taking this medicine and for 2 months after the last dose. If you cannot avoid being in the sun, wear protective clothing and use sunscreen. Do not use sun lamps or tanning beds/booths. You may need blood work done while you are taking this medicine. In some cases, you may be given additional medicines to help with side effects. Follow all directions for their use. Call your doctor or health care professional for advice if you get a fever, chills or sore throat, or other symptoms of a cold or flu. Do not treat yourself. This drug decreases your body's ability to fight infections. Try to avoid being around people who are sick. Avoid taking products that contain aspirin, acetaminophen, ibuprofen, naproxen, or ketoprofen unless instructed by your doctor. These medicines may hide a fever. Do not become pregnant while taking this medicine. Women should inform their doctor if they wish to become pregnant or think they might be pregnant. There is a potential for serious side effects to an unborn child. Use adequate birth control methods. Avoid pregnancy for at least 6 months after your last dose. Talk to your health care professional or pharmacist for more information. Do not breast-feed an infant while taking this medicine or during the 2 months after your last   dose. What side effects may I notice from receiving this medicine? Side effects that you should report to your doctor or health care professional as soon as possible: -allergic reactions like skin rash, itching or hives, swelling of the face, lips, or tongue -breathing problems -changes in vision -fast, irregular  heartbeat -feeling faint or lightheaded, falls -fever, chills -mouth sores -redness, blistering, peeling or loosening of the skin, including inside the mouth -trouble passing urine or change in the amount of urine -unusually weak or tired Side effects that usually do not require medical attention (report to your doctor or health care professional if they continue or are bothersome): -changes in skin like acne, cracks, skin dryness -constipation -diarrhea -headache -nail changes -nausea, vomiting -stomach upset -weight loss This list may not describe all possible side effects. Call your doctor for medical advice about side effects. You may report side effects to FDA at 1-800-FDA-1088. Where should I keep my medicine? This drug is given in a hospital or clinic and will not be stored at home. NOTE: This sheet is a summary. It may not cover all possible information. If you have questions about this medicine, talk to your doctor, pharmacist, or health care provider.  2015, [employer]. (2013-08-12 16:14:34)  

## 2014-10-05 ENCOUNTER — Encounter: Payer: Self-pay | Admitting: Family

## 2014-10-05 NOTE — Progress Notes (Signed)
I sent patient one app to tara to give to patient and have dr fill out for poss asst with erbitux .

## 2014-10-06 ENCOUNTER — Other Ambulatory Visit: Payer: Self-pay

## 2014-10-06 ENCOUNTER — Ambulatory Visit: Payer: Self-pay | Admitting: Family

## 2014-10-07 ENCOUNTER — Other Ambulatory Visit: Payer: Self-pay | Admitting: *Deleted

## 2014-10-07 DIAGNOSIS — C069 Malignant neoplasm of mouth, unspecified: Secondary | ICD-10-CM

## 2014-10-07 MED ORDER — OXYCODONE HCL 30 MG PO TABS
ORAL_TABLET | ORAL | Status: DC
Start: 2014-10-07 — End: 2014-10-25

## 2014-10-07 MED ORDER — FENTANYL 75 MCG/HR TD PT72: 150.0000 ug | MEDICATED_PATCH | TRANSDERMAL | Status: DC

## 2014-10-08 NOTE — Progress Notes (Signed)
Late note - 10/07/14 Lavina Hamman, RN received call from pt around 1500 reporting that he was bringing the bus to [employer] on [address] then walking to our office to pick up scripts. (view previous notes re: transportation issues). Pt aware office closes at 1700, but this RN will wait for pt to pick up scripts. Also aware that pharmacy closes at 1800.   Pt presents at 50 with male neighbor, Octaviano Glow, who reports she saw him walking, recognized him & picked him up. Ms. Eugenio Hoes states she lives across the street from pt & both she and her husband work in [employer] and would be willing to assist pt with transportation and picking up prescriptions. She also is comfortable being added to pt's chart as an emergency contact.  Kayshawn will sign authorization for prescription pick up at his appt on Tues, 5/31. Mr. Eugenio Hoes will bring pt to chemo appt on this day. Ronalee Belts may arrive early because of Mr. Boykin Nearing work schedule and states he does not mind waiting for his appt time.   Mrs. Eugenio Hoes phone - [phone] Mr. Delene Ruffini phone - [phone]  Suezanne Jacquet, pharmacist called to report he did NOT fill Fentanyl patches, but instructed pt to apply two patches of the dose he has at home to conserve pt's grant funds.

## 2014-10-12 ENCOUNTER — Ambulatory Visit (HOSPITAL_BASED_OUTPATIENT_CLINIC_OR_DEPARTMENT_OTHER): Payer: No Typology Code available for payment source

## 2014-10-12 ENCOUNTER — Encounter: Payer: Self-pay | Admitting: Hematology & Oncology

## 2014-10-12 ENCOUNTER — Other Ambulatory Visit (HOSPITAL_BASED_OUTPATIENT_CLINIC_OR_DEPARTMENT_OTHER): Payer: No Typology Code available for payment source

## 2014-10-12 VITALS — BP 93/51 | HR 52 | Temp 97.7°F | Resp 20 | Wt 120.0 lb

## 2014-10-12 DIAGNOSIS — Z5112 Encounter for antineoplastic immunotherapy: Secondary | ICD-10-CM

## 2014-10-12 DIAGNOSIS — C760 Malignant neoplasm of head, face and neck: Secondary | ICD-10-CM

## 2014-10-12 DIAGNOSIS — C069 Malignant neoplasm of mouth, unspecified: Secondary | ICD-10-CM

## 2014-10-12 LAB — CBC WITH DIFFERENTIAL (CANCER CENTER ONLY)
BASO#: 0 10*3/uL (ref 0.0–0.2)
BASO%: 0.1 % (ref 0.0–2.0)
EOS%: 0 % (ref 0.0–7.0)
Eosinophils Absolute: 0 10*3/uL (ref 0.0–0.5)
HEMATOCRIT: 28.7 % — AB (ref 38.7–49.9)
HGB: 9.6 g/dL — ABNORMAL LOW (ref 13.0–17.1)
LYMPH#: 0.2 10*3/uL — ABNORMAL LOW (ref 0.9–3.3)
LYMPH%: 2.8 % — AB (ref 14.0–48.0)
MCH: 31.3 pg (ref 28.0–33.4)
MCHC: 33.4 g/dL (ref 32.0–35.9)
MCV: 94 fL (ref 82–98)
MONO#: 0.4 10*3/uL (ref 0.1–0.9)
MONO%: 4.6 % (ref 0.0–13.0)
NEUT#: 7.7 10*3/uL — ABNORMAL HIGH (ref 1.5–6.5)
NEUT%: 92.5 % — ABNORMAL HIGH (ref 40.0–80.0)
Platelets: 223 10*3/uL (ref 145–400)
RBC: 3.07 10*6/uL — AB (ref 4.20–5.70)
RDW: 11.9 % (ref 11.1–15.7)
WBC: 8.3 10*3/uL (ref 4.0–10.0)

## 2014-10-12 LAB — CMP (CANCER CENTER ONLY)
ALK PHOS: 84 U/L (ref 26–84)
ALT: 18 U/L (ref 10–47)
AST: 21 U/L (ref 11–38)
Albumin: 3.1 g/dL — ABNORMAL LOW (ref 3.3–5.5)
BUN, Bld: 19 mg/dL (ref 7–22)
CO2: 31 meq/L (ref 18–33)
Calcium: 9.7 mg/dL (ref 8.0–10.3)
Chloride: 94 mEq/L — ABNORMAL LOW (ref 98–108)
Creat: 0.5 mg/dl — ABNORMAL LOW (ref 0.6–1.2)
Glucose, Bld: 109 mg/dL (ref 73–118)
Potassium: 4.2 mEq/L (ref 3.3–4.7)
SODIUM: 136 meq/L (ref 128–145)
TOTAL PROTEIN: 7.1 g/dL (ref 6.4–8.1)
Total Bilirubin: 0.5 mg/dl (ref 0.20–1.60)

## 2014-10-12 LAB — MAGNESIUM (CC13): MAGNESIUM: 2.3 mg/dL (ref 1.5–2.5)

## 2014-10-12 MED ORDER — DIPHENHYDRAMINE HCL 50 MG/ML IJ SOLN
50.0000 mg | Freq: Once | INTRAMUSCULAR | Status: AC
  Administered 2014-10-12: 50 mg via INTRAVENOUS

## 2014-10-12 MED ORDER — SODIUM CHLORIDE 0.9 % IV SOLN
Freq: Once | INTRAVENOUS | Status: AC
  Administered 2014-10-12: 11:00:00 via INTRAVENOUS

## 2014-10-12 MED ORDER — CETUXIMAB CHEMO IV INJECTION 200 MG/100ML
250.0000 mg/m2 | Freq: Once | INTRAVENOUS | Status: AC
  Administered 2014-10-12: 400 mg via INTRAVENOUS
  Filled 2014-10-12: qty 200

## 2014-10-12 MED ORDER — DIPHENHYDRAMINE HCL 50 MG/ML IJ SOLN
INTRAMUSCULAR | Status: AC
  Filled 2014-10-12: qty 1

## 2014-10-12 NOTE — Progress Notes (Signed)
I faxed patient one for possible asst with erbitux. [phone] per Louretta [facility] office

## 2014-10-12 NOTE — Patient Instructions (Signed)
[facility] Discharge Instructions for Patients Receiving Chemotherapy  Today you received the following chemotherapy agents Erbitux.  To help prevent nausea and vomiting after your treatment, we encourage you to take your nausea medication.   If you develop nausea and vomiting that is not controlled by your nausea medication, call the clinic.   BELOW ARE SYMPTOMS THAT SHOULD BE REPORTED IMMEDIATELY:  *FEVER GREATER THAN 100.5 F  *CHILLS WITH OR WITHOUT FEVER  NAUSEA AND VOMITING THAT IS NOT CONTROLLED WITH YOUR NAUSEA MEDICATION  *UNUSUAL SHORTNESS OF BREATH  *UNUSUAL BRUISING OR BLEEDING  TENDERNESS IN MOUTH AND THROAT WITH OR WITHOUT PRESENCE OF ULCERS  *URINARY PROBLEMS  *BOWEL PROBLEMS  UNUSUAL RASH Items with * indicate a potential emergency and should be followed up as soon as possible.  Feel free to call the clinic you have any questions or concerns. The clinic phone number is [phone].  Please show the CHEMO ALERT CARD at check-in to the Emergency Department and triage nurse.   

## 2014-10-18 ENCOUNTER — Ambulatory Visit: Payer: Self-pay

## 2014-10-18 ENCOUNTER — Ambulatory Visit (HOSPITAL_BASED_OUTPATIENT_CLINIC_OR_DEPARTMENT_OTHER): Payer: Medicaid Other

## 2014-10-18 ENCOUNTER — Other Ambulatory Visit (HOSPITAL_BASED_OUTPATIENT_CLINIC_OR_DEPARTMENT_OTHER): Payer: Medicaid Other

## 2014-10-18 ENCOUNTER — Other Ambulatory Visit: Payer: Self-pay

## 2014-10-18 VITALS — BP 107/59 | HR 67 | Temp 98.1°F | Resp 18

## 2014-10-18 DIAGNOSIS — Z5112 Encounter for antineoplastic immunotherapy: Secondary | ICD-10-CM

## 2014-10-18 DIAGNOSIS — C069 Malignant neoplasm of mouth, unspecified: Secondary | ICD-10-CM

## 2014-10-18 DIAGNOSIS — C760 Malignant neoplasm of head, face and neck: Secondary | ICD-10-CM

## 2014-10-18 DIAGNOSIS — Z5111 Encounter for antineoplastic chemotherapy: Secondary | ICD-10-CM | POA: Diagnosis not present

## 2014-10-18 LAB — CBC WITH DIFFERENTIAL (CANCER CENTER ONLY)
BASO#: 0 10*3/uL (ref 0.0–0.2)
BASO%: 0.2 % (ref 0.0–2.0)
EOS%: 0 % (ref 0.0–7.0)
Eosinophils Absolute: 0 10*3/uL (ref 0.0–0.5)
HEMATOCRIT: 27.7 % — AB (ref 38.7–49.9)
HEMOGLOBIN: 9.1 g/dL — AB (ref 13.0–17.1)
LYMPH#: 0.2 10*3/uL — AB (ref 0.9–3.3)
LYMPH%: 2.3 % — ABNORMAL LOW (ref 14.0–48.0)
MCH: 31 pg (ref 28.0–33.4)
MCHC: 32.9 g/dL (ref 32.0–35.9)
MCV: 94 fL (ref 82–98)
MONO#: 0.8 10*3/uL (ref 0.1–0.9)
MONO%: 9.7 % (ref 0.0–13.0)
NEUT%: 87.8 % — AB (ref 40.0–80.0)
NEUTROS ABS: 7.6 10*3/uL — AB (ref 1.5–6.5)
PLATELETS: 205 10*3/uL (ref 145–400)
RBC: 2.94 10*6/uL — AB (ref 4.20–5.70)
RDW: 12.2 % (ref 11.1–15.7)
WBC: 8.7 10*3/uL (ref 4.0–10.0)

## 2014-10-18 LAB — CMP (CANCER CENTER ONLY)
ALK PHOS: 74 U/L (ref 26–84)
ALT(SGPT): 16 U/L (ref 10–47)
AST: 20 U/L (ref 11–38)
Albumin: 3.1 g/dL — ABNORMAL LOW (ref 3.3–5.5)
BUN, Bld: 14 mg/dL (ref 7–22)
CO2: 32 meq/L (ref 18–33)
Calcium: 9.2 mg/dL (ref 8.0–10.3)
Chloride: 95 mEq/L — ABNORMAL LOW (ref 98–108)
Creat: 0.7 mg/dl (ref 0.6–1.2)
Glucose, Bld: 112 mg/dL (ref 73–118)
POTASSIUM: 4 meq/L (ref 3.3–4.7)
Sodium: 134 mEq/L (ref 128–145)
TOTAL PROTEIN: 6.8 g/dL (ref 6.4–8.1)
Total Bilirubin: 0.5 mg/dl (ref 0.20–1.60)

## 2014-10-18 LAB — MAGNESIUM (CC13): Magnesium: 1.8 mg/dl (ref 1.5–2.5)

## 2014-10-18 MED ORDER — CARBOPLATIN CHEMO INJECTION 600 MG/60ML
550.0000 mg | Freq: Once | INTRAVENOUS | Status: AC
  Administered 2014-10-18: 550 mg via INTRAVENOUS
  Filled 2014-10-18: qty 55

## 2014-10-18 MED ORDER — CETUXIMAB CHEMO IV INJECTION 200 MG/100ML
250.0000 mg/m2 | Freq: Once | INTRAVENOUS | Status: AC
  Administered 2014-10-18: 400 mg via INTRAVENOUS
  Filled 2014-10-18: qty 200

## 2014-10-18 MED ORDER — SODIUM CHLORIDE 0.9 % IV SOLN
Freq: Once | INTRAVENOUS | Status: AC
  Administered 2014-10-18: 12:00:00 via INTRAVENOUS
  Filled 2014-10-18: qty 8

## 2014-10-18 MED ORDER — DIPHENHYDRAMINE HCL 50 MG/ML IJ SOLN
50.0000 mg | Freq: Once | INTRAMUSCULAR | Status: AC
  Administered 2014-10-18: 50 mg via INTRAVENOUS

## 2014-10-18 MED ORDER — SODIUM CHLORIDE 0.9 % IV SOLN
Freq: Once | INTRAVENOUS | Status: AC
  Administered 2014-10-18: 11:00:00 via INTRAVENOUS

## 2014-10-18 MED ORDER — PEGFILGRASTIM 6 MG/0.6ML ~~LOC~~ PSKT
6.0000 mg | PREFILLED_SYRINGE | Freq: Once | SUBCUTANEOUS | Status: AC
  Administered 2014-10-18: 6 mg via SUBCUTANEOUS
  Filled 2014-10-18: qty 0.6

## 2014-10-18 MED ORDER — DIPHENHYDRAMINE HCL 50 MG/ML IJ SOLN
INTRAMUSCULAR | Status: AC
  Filled 2014-10-18: qty 1

## 2014-10-18 MED ORDER — HEPARIN SOD (PORK) LOCK FLUSH 100 UNIT/ML IV SOLN
500.0000 [IU] | Freq: Once | INTRAVENOUS | Status: DC | PRN
  Filled 2014-10-18: qty 5

## 2014-10-18 MED ORDER — SODIUM CHLORIDE 0.9 % IJ SOLN
10.0000 mL | INTRAMUSCULAR | Status: DC | PRN
  Filled 2014-10-18: qty 10

## 2014-10-18 MED ORDER — DOCETAXEL CHEMO INJECTION 160 MG/16ML
65.0000 mg/m2 | Freq: Once | INTRAVENOUS | Status: AC
Start: 2014-10-18 — End: 2014-10-18
  Administered 2014-10-18: 110 mg via INTRAVENOUS
  Filled 2014-10-18: qty 11

## 2014-10-18 NOTE — Patient Instructions (Signed)
[facility] Discharge Instructions for Patients Receiving Chemotherapy  Today you received the following chemotherapy agents Erbitux, Carboplatin, Taxotere  To help prevent nausea and vomiting after your treatment, we encourage you to take your nausea medication  1) Zofran (Ondansetron) 8 mg.  Take 1 tablet in the morning and one in the evening beginning day after chemotherapy.  Take for 3 days.  2) Decadron - Continue taking Decadron for 3 days after chemotherapy.  3) Compazine (Prochlorperazine) Take 1 tablet by mouth every 6 hours AS NEEDED for nausea.     If you develop nausea and vomiting that is not controlled by your nausea medication, call the clinic.   BELOW ARE SYMPTOMS THAT SHOULD BE REPORTED IMMEDIATELY:  *FEVER GREATER THAN 100.5 F  *CHILLS WITH OR WITHOUT FEVER  NAUSEA AND VOMITING THAT IS NOT CONTROLLED WITH YOUR NAUSEA MEDICATION  *UNUSUAL SHORTNESS OF BREATH  *UNUSUAL BRUISING OR BLEEDING  TENDERNESS IN MOUTH AND THROAT WITH OR WITHOUT PRESENCE OF ULCERS  *URINARY PROBLEMS  *BOWEL PROBLEMS  UNUSUAL RASH Items with * indicate a potential emergency and should be followed up as soon as possible.  Feel free to call the clinic you have any questions or concerns. The clinic phone number is [phone].  Please show the [facility] at check-in to the Emergency Department and triage nurse.      Cetuximab injection What is this medicine? CETUXIMAB (se TUX i mab) is a chemotherapy drug. It targets a specific protein within cancer cells and stops the cells from growing. It is used to treat colorectal cancer and head and neck cancer. This medicine may be used for other purposes; ask your health care provider or pharmacist if you have questions. COMMON BRAND NAME(S): Erbitux What should I tell my health care provider before I take this medicine? They need to know if you have any of these conditions: -heart disease -history of irregular  heartbeat -history of low levels of calcium, magnesium, or potassium in the blood -lung or breathing disease, like asthma -an unusual or allergic reaction to cetuximab, other medicines, foods, dyes, or preservatives -pregnant or trying to get pregnant -breast-feeding How should I use this medicine? This drug is given as an infusion into a vein. It is administered in a hospital or clinic by a specially trained health care professional. Talk to your pediatrician regarding the use of this medicine in children. Special care may be needed. Overdosage: If you think you have taken too much of this medicine contact a poison control center or emergency room at once. NOTE: This medicine is only for you. Do not share this medicine with others. What if I miss a dose? It is important not to miss your dose. Call your doctor or health care professional if you are unable to keep an appointment. What may interact with this medicine? Interactions are not expected. This list may not describe all possible interactions. Give your health care provider a list of all the medicines, herbs, non-prescription drugs, or dietary supplements you use. Also tell them if you smoke, drink alcohol, or use illegal drugs. Some items may interact with your medicine. What should I watch for while using this medicine? Visit your doctor or health care professional for regular checks on your progress. This drug may make you feel generally unwell. This is not uncommon, as chemotherapy can affect healthy cells as well as cancer cells. Report any side effects. Continue your course of treatment even though you feel ill unless your  doctor tells you to stop. This medicine can make you more sensitive to the sun. Keep out of the sun while taking this medicine and for 2 months after the last dose. If you cannot avoid being in the sun, wear protective clothing and use sunscreen. Do not use sun lamps or tanning beds/booths. You may need blood work  done while you are taking this medicine. In some cases, you may be given additional medicines to help with side effects. Follow all directions for their use. Call your doctor or health care professional for advice if you get a fever, chills or sore throat, or other symptoms of a cold or flu. Do not treat yourself. This drug decreases your body's ability to fight infections. Try to avoid being around people who are sick. Avoid taking products that contain aspirin, acetaminophen, ibuprofen, naproxen, or ketoprofen unless instructed by your doctor. These medicines may hide a fever. Do not become pregnant while taking this medicine. Women should inform their doctor if they wish to become pregnant or think they might be pregnant. There is a potential for serious side effects to an unborn child. Use adequate birth control methods. Avoid pregnancy for at least 6 months after your last dose. Talk to your health care professional or pharmacist for more information. Do not breast-feed an infant while taking this medicine or during the 2 months after your last dose. What side effects may I notice from receiving this medicine? Side effects that you should report to your doctor or health care professional as soon as possible: -allergic reactions like skin rash, itching or hives, swelling of the face, lips, or tongue -breathing problems -changes in vision -fast, irregular heartbeat -feeling faint or lightheaded, falls -fever, chills -mouth sores -redness, blistering, peeling or loosening of the skin, including inside the mouth -trouble passing urine or change in the amount of urine -unusually weak or tired Side effects that usually do not require medical attention (report to your doctor or health care professional if they continue or are bothersome): -changes in skin like acne, cracks, skin dryness -constipation -diarrhea -headache -nail changes -nausea, vomiting -stomach upset -weight loss This list may  not describe all possible side effects. Call your doctor for medical advice about side effects. You may report side effects to FDA at 1-800-FDA-1088. Where should I keep my medicine? This drug is given in a hospital or clinic and will not be stored at home. NOTE: This sheet is a summary. It may not cover all possible information. If you have questions about this medicine, talk to your doctor, pharmacist, or health care provider.  2015, [employer] Standard. (2013-08-12 16:14:34)  Carboplatin injection What is this medicine? CARBOPLATIN (KAR boe pla tin) is a chemotherapy drug. It targets fast dividing cells, like cancer cells, and causes these cells to die. This medicine is used to treat ovarian cancer and many other cancers. This medicine may be used for other purposes; ask your health care provider or pharmacist if you have questions. COMMON BRAND NAME(S): Paraplatin What should I tell my health care provider before I take this medicine? They need to know if you have any of these conditions: -blood disorders -hearing problems -kidney disease -recent or ongoing radiation therapy -an unusual or allergic reaction to carboplatin, cisplatin, other chemotherapy, other medicines, foods, dyes, or preservatives -pregnant or trying to get pregnant -breast-feeding How should I use this medicine? This drug is usually given as an infusion into a vein. It is administered in a hospital or clinic by  a specially trained health care professional. Talk to your pediatrician regarding the use of this medicine in children. Special care may be needed. Overdosage: If you think you have taken too much of this medicine contact a poison control center or emergency room at once. NOTE: This medicine is only for you. Do not share this medicine with others. What if I miss a dose? It is important not to miss a dose. Call your doctor or health care professional if you are unable to keep an appointment. What may  interact with this medicine? -medicines for seizures -medicines to increase blood counts like filgrastim, pegfilgrastim, sargramostim -some antibiotics like amikacin, gentamicin, neomycin, streptomycin, tobramycin -vaccines Talk to your doctor or health care professional before taking any of these medicines: -acetaminophen -aspirin -ibuprofen -ketoprofen -naproxen This list may not describe all possible interactions. Give your health care provider a list of all the medicines, herbs, non-prescription drugs, or dietary supplements you use. Also tell them if you smoke, drink alcohol, or use illegal drugs. Some items may interact with your medicine. What should I watch for while using this medicine? Your condition will be monitored carefully while you are receiving this medicine. You will need important blood work done while you are taking this medicine. This drug may make you feel generally unwell. This is not uncommon, as chemotherapy can affect healthy cells as well as cancer cells. Report any side effects. Continue your course of treatment even though you feel ill unless your doctor tells you to stop. In some cases, you may be given additional medicines to help with side effects. Follow all directions for their use. Call your doctor or health care professional for advice if you get a fever, chills or sore throat, or other symptoms of a cold or flu. Do not treat yourself. This drug decreases your body's ability to fight infections. Try to avoid being around people who are sick. This medicine may increase your risk to bruise or bleed. Call your doctor or health care professional if you notice any unusual bleeding. Be careful brushing and flossing your teeth or using a toothpick because you may get an infection or bleed more easily. If you have any dental work done, tell your dentist you are receiving this medicine. Avoid taking products that contain aspirin, acetaminophen, ibuprofen, naproxen, or  ketoprofen unless instructed by your doctor. These medicines may hide a fever. Do not become pregnant while taking this medicine. Women should inform their doctor if they wish to become pregnant or think they might be pregnant. There is a potential for serious side effects to an unborn child. Talk to your health care professional or pharmacist for more information. Do not breast-feed an infant while taking this medicine. What side effects may I notice from receiving this medicine? Side effects that you should report to your doctor or health care professional as soon as possible: -allergic reactions like skin rash, itching or hives, swelling of the face, lips, or tongue -signs of infection - fever or chills, cough, sore throat, pain or difficulty passing urine -signs of decreased platelets or bleeding - bruising, pinpoint red spots on the skin, black, tarry stools, nosebleeds -signs of decreased red blood cells - unusually weak or tired, fainting spells, lightheadedness -breathing problems -changes in hearing -changes in vision -chest pain -high blood pressure -low blood counts - This drug may decrease the number of white blood cells, red blood cells and platelets. You may be at increased risk for infections and bleeding. -nausea and vomiting -  pain, swelling, redness or irritation at the injection site -pain, tingling, numbness in the hands or feet -problems with balance, talking, walking -trouble passing urine or change in the amount of urine Side effects that usually do not require medical attention (report to your doctor or health care professional if they continue or are bothersome): -hair loss -loss of appetite -metallic taste in the mouth or changes in taste This list may not describe all possible side effects. Call your doctor for medical advice about side effects. You may report side effects to FDA at 1-800-FDA-1088. Where should I keep my medicine? This drug is given in a hospital or  clinic and will not be stored at home. NOTE: This sheet is a summary. It may not cover all possible information. If you have questions about this medicine, talk to your doctor, pharmacist, or health care provider.  2015, Elsevier/Gold Standard. (2007-08-05 14:38:05)   Docetaxel injection What is this medicine? DOCETAXEL (doe se TAX el) is a chemotherapy drug. It targets fast dividing cells, like cancer cells, and causes these cells to die. This medicine is used to treat many types of cancers like breast cancer, certain stomach cancers, head and neck cancer, lung cancer, and prostate cancer. This medicine may be used for other purposes; ask your health care provider or pharmacist if you have questions. COMMON BRAND NAME(S): Docefrez, Taxotere What should I tell my health care provider before I take this medicine? They need to know if you have any of these conditions: -infection (especially a virus infection such as chickenpox, cold sores, or herpes) -liver disease -low blood counts, like low white cell, platelet, or red cell counts -an unusual or allergic reaction to docetaxel, polysorbate 80, other chemotherapy agents, other medicines, foods, dyes, or preservatives -pregnant or trying to get pregnant -breast-feeding How should I use this medicine? This drug is given as an infusion into a vein. It is administered in a hospital or clinic by a specially trained health care professional. Talk to your pediatrician regarding the use of this medicine in children. Special care may be needed. Overdosage: If you think you have taken too much of this medicine contact a poison control center or emergency room at once. NOTE: This medicine is only for you. Do not share this medicine with others. What if I miss a dose? It is important not to miss your dose. Call your doctor or health care professional if you are unable to keep an appointment. What may interact with this  medicine? -cyclosporine -erythromycin -ketoconazole -medicines to increase blood counts like filgrastim, pegfilgrastim, sargramostim -vaccines Talk to your doctor or health care professional before taking any of these medicines: -acetaminophen -aspirin -ibuprofen -ketoprofen -naproxen This list may not describe all possible interactions. Give your health care provider a list of all the medicines, herbs, non-prescription drugs, or dietary supplements you use. Also tell them if you smoke, drink alcohol, or use illegal drugs. Some items may interact with your medicine. What should I watch for while using this medicine? Your condition will be monitored carefully while you are receiving this medicine. You will need important blood work done while you are taking this medicine. This drug may make you feel generally unwell. This is not uncommon, as chemotherapy can affect healthy cells as well as cancer cells. Report any side effects. Continue your course of treatment even though you feel ill unless your doctor tells you to stop. In some cases, you may be given additional medicines to help with side effects. Follow  all directions for their use. Call your doctor or health care professional for advice if you get a fever, chills or sore throat, or other symptoms of a cold or flu. Do not treat yourself. This drug decreases your body's ability to fight infections. Try to avoid being around people who are sick. This medicine may increase your risk to bruise or bleed. Call your doctor or health care professional if you notice any unusual bleeding. Be careful brushing and flossing your teeth or using a toothpick because you may get an infection or bleed more easily. If you have any dental work done, tell your dentist you are receiving this medicine. Avoid taking products that contain aspirin, acetaminophen, ibuprofen, naproxen, or ketoprofen unless instructed by your doctor. These medicines may hide a  fever. This medicine contains an alcohol in the product. You may get drowsy or dizzy. Do not drive, use machinery, or do anything that needs mental alertness until you know how this medicine affects you. Do not stand or sit up quickly, especially if you are an older patient. This reduces the risk of dizzy or fainting spells. Avoid alcoholic drinks Do not become pregnant while taking this medicine. Women should inform their doctor if they wish to become pregnant or think they might be pregnant. There is a potential for serious side effects to an unborn child. Talk to your health care professional or pharmacist for more information. Do not breast-feed an infant while taking this medicine. What side effects may I notice from receiving this medicine? Side effects that you should report to your doctor or health care professional as soon as possible: -allergic reactions like skin rash, itching or hives, swelling of the face, lips, or tongue -low blood counts - This drug may decrease the number of white blood cells, red blood cells and platelets. You may be at increased risk for infections and bleeding. -signs of infection - fever or chills, cough, sore throat, pain or difficulty passing urine -signs of decreased platelets or bleeding - bruising, pinpoint red spots on the skin, black, tarry stools, nosebleeds -signs of decreased red blood cells - unusually weak or tired, fainting spells, lightheadedness -breathing problems -fast or irregular heartbeat -low blood pressure -mouth sores -nausea and vomiting -pain, swelling, redness or irritation at the injection site -pain, tingling, numbness in the hands or feet -swelling of the ankle, feet, hands -weight gain Side effects that usually do not require medical attention (report to your prescriber or health care professional if they continue or are bothersome): -bone pain -complete hair loss including hair on your head, underarms, pubic hair, eyebrows, and  eyelashes -diarrhea -excessive tearing -changes in the color of fingernails -loosening of the fingernails -nausea -muscle pain -red flush to skin -sweating -weak or tired This list may not describe all possible side effects. Call your doctor for medical advice about side effects. You may report side effects to FDA at 1-800-FDA-1088. Where should I keep my medicine? This drug is given in a hospital or clinic and will not be stored at home. NOTE: This sheet is a summary. It may not cover all possible information. If you have questions about this medicine, talk to your doctor, pharmacist, or health care provider.  2015, [employer]/Gold Standard. (2013-03-26 22:21:02)

## 2014-10-21 ENCOUNTER — Telehealth: Payer: Self-pay | Admitting: *Deleted

## 2014-10-21 NOTE — Telephone Encounter (Signed)
APPROVED 10/18/2014 through 10/13/2015  [id number]

## 2014-10-22 ENCOUNTER — Telehealth: Payer: Self-pay | Admitting: Hematology & Oncology

## 2014-10-22 NOTE — Telephone Encounter (Signed)
Patient: Derek dean S., DOB: [birth year]  Drug: FentaNYL 75MCG/HR 72 hr patches  PA created date: 10/18/2014  Outcome: Approved

## 2014-10-25 ENCOUNTER — Other Ambulatory Visit (HOSPITAL_BASED_OUTPATIENT_CLINIC_OR_DEPARTMENT_OTHER): Payer: No Typology Code available for payment source

## 2014-10-25 ENCOUNTER — Ambulatory Visit: Payer: Self-pay

## 2014-10-25 ENCOUNTER — Other Ambulatory Visit: Payer: Self-pay | Admitting: *Deleted

## 2014-10-25 ENCOUNTER — Ambulatory Visit (HOSPITAL_BASED_OUTPATIENT_CLINIC_OR_DEPARTMENT_OTHER): Payer: No Typology Code available for payment source

## 2014-10-25 ENCOUNTER — Other Ambulatory Visit: Payer: Self-pay | Admitting: Emergency Medicine

## 2014-10-25 ENCOUNTER — Other Ambulatory Visit: Payer: Self-pay

## 2014-10-25 VITALS — BP 123/74 | HR 87 | Temp 98.2°F | Resp 18

## 2014-10-25 DIAGNOSIS — C069 Malignant neoplasm of mouth, unspecified: Secondary | ICD-10-CM

## 2014-10-25 DIAGNOSIS — Z5112 Encounter for antineoplastic immunotherapy: Secondary | ICD-10-CM

## 2014-10-25 DIAGNOSIS — C760 Malignant neoplasm of head, face and neck: Secondary | ICD-10-CM

## 2014-10-25 LAB — CBC WITH DIFFERENTIAL (CANCER CENTER ONLY)
BASO#: 0 10*3/uL (ref 0.0–0.2)
BASO%: 0.3 % (ref 0.0–2.0)
EOS ABS: 0 10*3/uL (ref 0.0–0.5)
EOS%: 0 % (ref 0.0–7.0)
HCT: 29.4 % — ABNORMAL LOW (ref 38.7–49.9)
HEMOGLOBIN: 9.7 g/dL — AB (ref 13.0–17.1)
LYMPH#: 0.1 10*3/uL — ABNORMAL LOW (ref 0.9–3.3)
LYMPH%: 3.2 % — ABNORMAL LOW (ref 14.0–48.0)
MCH: 30.3 pg (ref 28.0–33.4)
MCHC: 33 g/dL (ref 32.0–35.9)
MCV: 92 fL (ref 82–98)
MONO#: 0.1 10*3/uL (ref 0.1–0.9)
MONO%: 4.1 % (ref 0.0–13.0)
NEUT%: 92.4 % — AB (ref 40.0–80.0)
NEUTROS ABS: 2.9 10*3/uL (ref 1.5–6.5)
Platelets: 206 10*3/uL (ref 145–400)
RBC: 3.2 10*6/uL — ABNORMAL LOW (ref 4.20–5.70)
RDW: 12.2 % (ref 11.1–15.7)
WBC: 3.2 10*3/uL — AB (ref 4.0–10.0)

## 2014-10-25 LAB — COMPREHENSIVE METABOLIC PANEL (CC13)
ALK PHOS: 74 U/L (ref 40–150)
ALT: 12 U/L (ref 0–55)
AST: 16 U/L (ref 5–34)
Albumin: 2.9 g/dL — ABNORMAL LOW (ref 3.5–5.0)
Anion Gap: 15 mEq/L — ABNORMAL HIGH (ref 3–11)
BILIRUBIN TOTAL: 0.2 mg/dL (ref 0.20–1.20)
BUN: 15.3 mg/dL (ref 7.0–26.0)
CO2: 26 meq/L (ref 22–29)
CREATININE: 0.7 mg/dL (ref 0.7–1.3)
Calcium: 9.3 mg/dL (ref 8.4–10.4)
Chloride: 96 mEq/L — ABNORMAL LOW (ref 98–109)
EGFR: >90 mL/min/{1.73_m2} (ref >90)
Glucose: 192 mg/dl — ABNORMAL HIGH (ref 70–140)
Potassium: 4.1 mEq/L (ref 3.5–5.1)
Sodium: 137 mEq/L (ref 136–145)
Total Protein: 7 g/dL (ref 6.4–8.3)

## 2014-10-25 LAB — MAGNESIUM (CC13): MAGNESIUM: 1.6 mg/dL (ref 1.5–2.5)

## 2014-10-25 MED ORDER — HYDROMORPHONE HCL 4 MG/ML IJ SOLN
4.0000 mg | Freq: Once | INTRAMUSCULAR | Status: AC
  Administered 2014-10-25: 4 mg via INTRAVENOUS

## 2014-10-25 MED ORDER — LIDOCAINE VISCOUS 2 % MT SOLN: 20.0000 mL | OROMUCOSAL | Status: DC | PRN

## 2014-10-25 MED ORDER — HYDROMORPHONE HCL 4 MG/ML IJ SOLN
INTRAMUSCULAR | Status: AC
  Filled 2014-10-25: qty 1

## 2014-10-25 MED ORDER — DIPHENHYDRAMINE HCL 50 MG/ML IJ SOLN
50.0000 mg | Freq: Once | INTRAMUSCULAR | Status: AC
  Administered 2014-10-25: 50 mg via INTRAVENOUS

## 2014-10-25 MED ORDER — CETUXIMAB CHEMO IV INJECTION 200 MG/100ML
250.0000 mg/m2 | Freq: Once | INTRAVENOUS | Status: AC
  Administered 2014-10-25: 400 mg via INTRAVENOUS
  Filled 2014-10-25: qty 200

## 2014-10-25 MED ORDER — DIPHENHYDRAMINE HCL 50 MG/ML IJ SOLN
INTRAMUSCULAR | Status: AC
  Filled 2014-10-25: qty 1

## 2014-10-25 MED ORDER — OXYCODONE HCL 30 MG PO TABS
ORAL_TABLET | ORAL | Status: DC
Start: 2014-10-25 — End: 2014-11-29

## 2014-10-25 MED ORDER — SODIUM CHLORIDE 0.9 % IV SOLN
Freq: Once | INTRAVENOUS | Status: AC
  Administered 2014-10-25: 11:00:00 via INTRAVENOUS

## 2014-10-25 MED ORDER — SODIUM CHLORIDE 0.9 % IV SOLN
Freq: Once | INTRAVENOUS | Status: AC
  Administered 2014-10-25: 12:00:00 via INTRAVENOUS
  Filled 2014-10-25: qty 8

## 2014-10-25 NOTE — Patient Instructions (Signed)
Cetuximab injection What is this medicine? CETUXIMAB (se TUX i mab) is a chemotherapy drug. It targets a specific protein within cancer cells and stops the cells from growing. It is used to treat colorectal cancer and head and neck cancer. This medicine may be used for other purposes; ask your health care provider or pharmacist if you have questions. COMMON BRAND NAME(S): Erbitux What should I tell my health care provider before I take this medicine? They need to know if you have any of these conditions: -heart disease -history of irregular heartbeat -history of low levels of calcium, magnesium, or potassium in the blood -lung or breathing disease, like asthma -an unusual or allergic reaction to cetuximab, other medicines, foods, dyes, or preservatives -pregnant or trying to get pregnant -breast-feeding How should I use this medicine? This drug is given as an infusion into a vein. It is administered in a hospital or clinic by a specially trained health care professional. Talk to your pediatrician regarding the use of this medicine in children. Special care may be needed. Overdosage: If you think you have taken too much of this medicine contact a poison control center or emergency room at once. NOTE: This medicine is only for you. Do not share this medicine with others. What if I miss a dose? It is important not to miss your dose. Call your doctor or health care professional if you are unable to keep an appointment. What may interact with this medicine? Interactions are not expected. This list may not describe all possible interactions. Give your health care provider a list of all the medicines, herbs, non-prescription drugs, or dietary supplements you use. Also tell them if you smoke, drink alcohol, or use illegal drugs. Some items may interact with your medicine. What should I watch for while using this medicine? Visit your doctor or health care professional for regular checks on your  progress. This drug may make you feel generally unwell. This is not uncommon, as chemotherapy can affect healthy cells as well as cancer cells. Report any side effects. Continue your course of treatment even though you feel ill unless your doctor tells you to stop. This medicine can make you more sensitive to the sun. Keep out of the sun while taking this medicine and for 2 months after the last dose. If you cannot avoid being in the sun, wear protective clothing and use sunscreen. Do not use sun lamps or tanning beds/booths. You may need blood work done while you are taking this medicine. In some cases, you may be given additional medicines to help with side effects. Follow all directions for their use. Call your doctor or health care professional for advice if you get a fever, chills or sore throat, or other symptoms of a cold or flu. Do not treat yourself. This drug decreases your body's ability to fight infections. Try to avoid being around people who are sick. Avoid taking products that contain aspirin, acetaminophen, ibuprofen, naproxen, or ketoprofen unless instructed by your doctor. These medicines may hide a fever. Do not become pregnant while taking this medicine. Women should inform their doctor if they wish to become pregnant or think they might be pregnant. There is a potential for serious side effects to an unborn child. Use adequate birth control methods. Avoid pregnancy for at least 6 months after your last dose. Talk to your health care professional or pharmacist for more information. Do not breast-feed an infant while taking this medicine or during the 2 months after your last   dose. What side effects may I notice from receiving this medicine? Side effects that you should report to your doctor or health care professional as soon as possible: -allergic reactions like skin rash, itching or hives, swelling of the face, lips, or tongue -breathing problems -changes in vision -fast, irregular  heartbeat -feeling faint or lightheaded, falls -fever, chills -mouth sores -redness, blistering, peeling or loosening of the skin, including inside the mouth -trouble passing urine or change in the amount of urine -unusually weak or tired Side effects that usually do not require medical attention (report to your doctor or health care professional if they continue or are bothersome): -changes in skin like acne, cracks, skin dryness -constipation -diarrhea -headache -nail changes -nausea, vomiting -stomach upset -weight loss This list may not describe all possible side effects. Call your doctor for medical advice about side effects. You may report side effects to FDA at 1-800-FDA-1088. Where should I keep my medicine? This drug is given in a hospital or clinic and will not be stored at home. NOTE: This sheet is a summary. It may not cover all possible information. If you have questions about this medicine, talk to your doctor, pharmacist, or health care provider.  2015, [employer]. (2013-08-12 16:14:34)  

## 2014-10-29 ENCOUNTER — Emergency Department (HOSPITAL_COMMUNITY): Payer: Medicaid Other

## 2014-10-29 ENCOUNTER — Encounter (HOSPITAL_COMMUNITY): Payer: Self-pay | Admitting: Family Medicine

## 2014-10-29 ENCOUNTER — Inpatient Hospital Stay (HOSPITAL_COMMUNITY)
Admission: EM | Admit: 2014-10-29 | Discharge: 2014-11-03 | DRG: 166 | Disposition: A | Discharge status: Home / Self Care | Payer: Medicaid Other | Attending: Internal Medicine | Admitting: Internal Medicine

## 2014-10-29 DIAGNOSIS — F17210 Nicotine dependence, cigarettes, uncomplicated: Secondary | ICD-10-CM | POA: Diagnosis present

## 2014-10-29 DIAGNOSIS — Z9221 Personal history of antineoplastic chemotherapy: Secondary | ICD-10-CM

## 2014-10-29 DIAGNOSIS — C7951 Secondary malignant neoplasm of bone: Secondary | ICD-10-CM | POA: Diagnosis present

## 2014-10-29 DIAGNOSIS — R590 Localized enlarged lymph nodes: Secondary | ICD-10-CM

## 2014-10-29 DIAGNOSIS — J850 Gangrene and necrosis of lung: Secondary | ICD-10-CM | POA: Diagnosis present

## 2014-10-29 DIAGNOSIS — Y833 Surgical operation with formation of external stoma as the cause of abnormal reaction of the patient, or of later complication, without mention of misadventure at the time of the procedure: Secondary | ICD-10-CM | POA: Diagnosis present

## 2014-10-29 DIAGNOSIS — C781 Secondary malignant neoplasm of mediastinum: Secondary | ICD-10-CM | POA: Diagnosis present

## 2014-10-29 DIAGNOSIS — Z91048 Other nonmedicinal substance allergy status: Secondary | ICD-10-CM

## 2014-10-29 DIAGNOSIS — C799 Secondary malignant neoplasm of unspecified site: Secondary | ICD-10-CM | POA: Diagnosis present

## 2014-10-29 DIAGNOSIS — E440 Moderate protein-calorie malnutrition: Secondary | ICD-10-CM | POA: Insufficient documentation

## 2014-10-29 DIAGNOSIS — D61810 Antineoplastic chemotherapy induced pancytopenia: Secondary | ICD-10-CM | POA: Diagnosis present

## 2014-10-29 DIAGNOSIS — K9423 Gastrostomy malfunction: Secondary | ICD-10-CM | POA: Diagnosis present

## 2014-10-29 DIAGNOSIS — J189 Pneumonia, unspecified organism: Secondary | ICD-10-CM | POA: Diagnosis present

## 2014-10-29 DIAGNOSIS — E871 Hypo-osmolality and hyponatremia: Secondary | ICD-10-CM | POA: Diagnosis present

## 2014-10-29 DIAGNOSIS — Z431 Encounter for attention to gastrostomy: Secondary | ICD-10-CM | POA: Insufficient documentation

## 2014-10-29 DIAGNOSIS — C029 Malignant neoplasm of tongue, unspecified: Secondary | ICD-10-CM | POA: Diagnosis present

## 2014-10-29 DIAGNOSIS — I82210 Acute embolism and thrombosis of superior vena cava: Secondary | ICD-10-CM | POA: Diagnosis present

## 2014-10-29 DIAGNOSIS — Z9889 Other specified postprocedural states: Secondary | ICD-10-CM

## 2014-10-29 DIAGNOSIS — K2970 Gastritis, unspecified, without bleeding: Secondary | ICD-10-CM | POA: Diagnosis present

## 2014-10-29 DIAGNOSIS — Y95 Nosocomial condition: Secondary | ICD-10-CM | POA: Diagnosis present

## 2014-10-29 DIAGNOSIS — J984 Other disorders of lung: Secondary | ICD-10-CM

## 2014-10-29 DIAGNOSIS — A419 Sepsis, unspecified organism: Secondary | ICD-10-CM | POA: Diagnosis present

## 2014-10-29 DIAGNOSIS — Z681 Body mass index (BMI) 19 or less, adult: Secondary | ICD-10-CM

## 2014-10-29 DIAGNOSIS — J690 Pneumonitis due to inhalation of food and vomit: Principal | ICD-10-CM | POA: Diagnosis present

## 2014-10-29 DIAGNOSIS — I959 Hypotension, unspecified: Secondary | ICD-10-CM | POA: Diagnosis present

## 2014-10-29 DIAGNOSIS — T451X5A Adverse effect of antineoplastic and immunosuppressive drugs, initial encounter: Secondary | ICD-10-CM | POA: Diagnosis present

## 2014-10-29 DIAGNOSIS — R066 Hiccough: Secondary | ICD-10-CM | POA: Diagnosis present

## 2014-10-29 DIAGNOSIS — Z85810 Personal history of malignant neoplasm of tongue: Secondary | ICD-10-CM

## 2014-10-29 DIAGNOSIS — I2699 Other pulmonary embolism without acute cor pulmonale: Secondary | ICD-10-CM | POA: Diagnosis present

## 2014-10-29 DIAGNOSIS — C7801 Secondary malignant neoplasm of right lung: Secondary | ICD-10-CM | POA: Diagnosis present

## 2014-10-29 DIAGNOSIS — R109 Unspecified abdominal pain: Secondary | ICD-10-CM

## 2014-10-29 DIAGNOSIS — Z931 Gastrostomy status: Secondary | ICD-10-CM

## 2014-10-29 DIAGNOSIS — G894 Chronic pain syndrome: Secondary | ICD-10-CM | POA: Diagnosis present

## 2014-10-29 DIAGNOSIS — R1310 Dysphagia, unspecified: Secondary | ICD-10-CM | POA: Diagnosis present

## 2014-10-29 DIAGNOSIS — C069 Malignant neoplasm of mouth, unspecified: Secondary | ICD-10-CM

## 2014-10-29 LAB — COMPREHENSIVE METABOLIC PANEL
ALBUMIN: 2.6 g/dL — AB (ref 3.5–5.0)
ALT: 17 U/L (ref 17–63)
ALT: 16 U/L — ABNORMAL LOW (ref 17–63)
ANION GAP: 10 (ref 5–15)
AST: 23 U/L (ref 15–41)
AST: 20 U/L (ref 15–41)
Albumin: 2.4 g/dL — ABNORMAL LOW (ref 3.5–5.0)
Alkaline Phosphatase: 54 U/L (ref 38–126)
Alkaline Phosphatase: 53 U/L (ref 38–126)
Anion gap: 8 (ref 5–15)
BILIRUBIN TOTAL: 0.4 mg/dL (ref 0.3–1.2)
BUN: 12 mg/dL (ref 6–20)
BUN: 11 mg/dL (ref 6–20)
CALCIUM: 8.2 mg/dL — AB (ref 8.9–10.3)
CHLORIDE: 89 mmol/L — AB (ref 101–111)
CO2: 34 mmol/L — ABNORMAL HIGH (ref 22–32)
CO2: 32 mmol/L (ref 22–32)
CREATININE: 0.62 mg/dL (ref 0.61–1.24)
CREATININE: 0.61 mg/dL (ref 0.61–1.24)
Calcium: 8.5 mg/dL — ABNORMAL LOW (ref 8.9–10.3)
Chloride: 88 mmol/L — ABNORMAL LOW (ref 101–111)
GFR calc Af Amer: >60 mL/min (ref >60)
GFR calc non Af Amer: >60 mL/min (ref >60)
GFR calc non Af Amer: >60 mL/min (ref >60)
GLUCOSE: 119 mg/dL — AB (ref 65–99)
Glucose, Bld: 106 mg/dL — ABNORMAL HIGH (ref 65–99)
Potassium: 3.7 mmol/L (ref 3.5–5.1)
Potassium: 3.8 mmol/L (ref 3.5–5.1)
SODIUM: 129 mmol/L — AB (ref 135–145)
Sodium: 132 mmol/L — ABNORMAL LOW (ref 135–145)
TOTAL PROTEIN: 6.5 g/dL (ref 6.5–8.1)
Total Bilirubin: 0.3 mg/dL (ref 0.3–1.2)
Total Protein: 5.8 g/dL — ABNORMAL LOW (ref 6.5–8.1)

## 2014-10-29 LAB — CBC WITH DIFFERENTIAL/PLATELET
BASOS ABS: 0 10*3/uL (ref 0.0–0.1)
Basophils Absolute: 0 10*3/uL (ref 0.0–0.1)
Basophils Relative: 0 % (ref 0–1)
Basophils Relative: 0 % (ref 0–1)
Eosinophils Absolute: 0 10*3/uL (ref 0.0–0.7)
Eosinophils Absolute: 0 10*3/uL (ref 0.0–0.7)
Eosinophils Relative: 0 % (ref 0–5)
Eosinophils Relative: 0 % (ref 0–5)
HCT: 22.5 % — ABNORMAL LOW (ref 39.0–52.0)
HEMATOCRIT: 22.7 % — AB (ref 39.0–52.0)
HEMOGLOBIN: 7.5 g/dL — AB (ref 13.0–17.0)
HEMOGLOBIN: 7.5 g/dL — AB (ref 13.0–17.0)
LYMPHS ABS: 0.2 10*3/uL — AB (ref 0.7–4.0)
LYMPHS ABS: 0.3 10*3/uL — AB (ref 0.7–4.0)
LYMPHS PCT: 12 % (ref 12–46)
Lymphocytes Relative: 7 % — ABNORMAL LOW (ref 12–46)
MCH: 28.8 pg (ref 26.0–34.0)
MCH: 29 pg (ref 26.0–34.0)
MCHC: 33 g/dL (ref 30.0–36.0)
MCHC: 33.3 g/dL (ref 30.0–36.0)
MCV: 87.3 fL (ref 78.0–100.0)
MCV: 86.9 fL (ref 78.0–100.0)
MONO ABS: 0.6 10*3/uL (ref 0.1–1.0)
MONOS PCT: 23 % — AB (ref 3–12)
Monocytes Absolute: 0.4 10*3/uL (ref 0.1–1.0)
Monocytes Relative: 14 % — ABNORMAL HIGH (ref 3–12)
NEUTROS PCT: 74 % (ref 43–77)
Neutro Abs: 1.8 10*3/uL (ref 1.7–7.7)
Neutro Abs: 2 10*3/uL (ref 1.7–7.7)
Neutrophils Relative %: 70 % (ref 43–77)
Platelets: 175 10*3/uL (ref 150–400)
Platelets: 164 10*3/uL (ref 150–400)
RBC: 2.6 MIL/uL — ABNORMAL LOW (ref 4.22–5.81)
RBC: 2.59 MIL/uL — ABNORMAL LOW (ref 4.22–5.81)
RDW: 12.9 % (ref 11.5–15.5)
RDW: 12.9 % (ref 11.5–15.5)
WBC: 2.6 10*3/uL — ABNORMAL LOW (ref 4.0–10.5)
WBC: 2.6 10*3/uL — ABNORMAL LOW (ref 4.0–10.5)

## 2014-10-29 LAB — ABO/RH: ABO/RH(D): O POS

## 2014-10-29 LAB — APTT: aPTT: 42 seconds — ABNORMAL HIGH (ref 24–37)

## 2014-10-29 LAB — LACTIC ACID, PLASMA: Lactic Acid, Venous: 0.6 mmol/L (ref 0.5–2.0)

## 2014-10-29 LAB — I-STAT CG4 LACTIC ACID, ED: Lactic Acid, Venous: 0.45 mmol/L — ABNORMAL LOW (ref 0.5–2.0)

## 2014-10-29 LAB — FIBRINOGEN: Fibrinogen: 726 mg/dL — ABNORMAL HIGH (ref 204–475)

## 2014-10-29 LAB — PROTIME-INR
INR: 1.22 (ref 0.00–1.49)
PROTHROMBIN TIME: 15.6 s — AB (ref 11.6–15.2)

## 2014-10-29 MED ORDER — IOHEXOL 300 MG/ML  SOLN
100.0000 mL | Freq: Once | INTRAMUSCULAR | Status: AC | PRN
  Administered 2014-10-29: 100 mL via INTRAVENOUS

## 2014-10-29 MED ORDER — SODIUM CHLORIDE 0.9 % IV SOLN
3.0000 g | Freq: Once | INTRAVENOUS | Status: DC
  Filled 2014-10-29: qty 3

## 2014-10-29 MED ORDER — PIPERACILLIN-TAZOBACTAM 3.375 G IVPB
3.3750 g | Freq: Three times a day (TID) | INTRAVENOUS | Status: DC
  Administered 2014-10-30 – 2014-11-03 (×14): 3.375 g via INTRAVENOUS
  Filled 2014-10-29 (×16): qty 50

## 2014-10-29 MED ORDER — ONDANSETRON HCL 4 MG/2ML IJ SOLN
4.0000 mg | Freq: Once | INTRAMUSCULAR | Status: DC
  Filled 2014-10-29: qty 2

## 2014-10-29 MED ORDER — PIPERACILLIN-TAZOBACTAM 3.375 G IVPB
3.3750 g | Freq: Once | INTRAVENOUS | Status: AC
  Administered 2014-10-29: 3.375 g via INTRAVENOUS
  Filled 2014-10-29: qty 50

## 2014-10-29 MED ORDER — VANCOMYCIN HCL IN DEXTROSE 750-5 MG/150ML-% IV SOLN
750.0000 mg | Freq: Two times a day (BID) | INTRAVENOUS | Status: DC
  Administered 2014-10-30 – 2014-11-02 (×8): 750 mg via INTRAVENOUS
  Filled 2014-10-29 (×12): qty 150

## 2014-10-29 MED ORDER — VANCOMYCIN HCL IN DEXTROSE 1-5 GM/200ML-% IV SOLN
1000.0000 mg | Freq: Once | INTRAVENOUS | Status: AC
  Administered 2014-10-29: 1000 mg via INTRAVENOUS
  Filled 2014-10-29: qty 200

## 2014-10-29 MED ORDER — IOHEXOL 300 MG/ML  SOLN: 25.0000 mL | Freq: Once | INTRAMUSCULAR | Status: AC | PRN

## 2014-10-29 MED ORDER — CHLORPROMAZINE HCL 25 MG PO TABS
25.0000 mg | ORAL_TABLET | Freq: Once | ORAL | Status: AC
  Administered 2014-10-29: 25 mg via ORAL
  Filled 2014-10-29: qty 1

## 2014-10-29 MED ORDER — MORPHINE SULFATE 4 MG/ML IJ SOLN
4.0000 mg | Freq: Once | INTRAMUSCULAR | Status: DC
  Filled 2014-10-29: qty 1

## 2014-10-29 NOTE — ED Notes (Signed)
Pt here for 3 days of hiccups and possible infected g tube. Odor from tube.

## 2014-10-29 NOTE — Progress Notes (Signed)
ANTIBIOTIC CONSULT NOTE - INITIAL  Pharmacy Consult for Vancomycin and Zosyn Indication: rule out sepsis  Allergies  Allergen Reactions  . Other     Cat Gut Stitches - unknown reaction.Told by parents.   Vital Signs: Temp: 97.4 F (36.3 C) (06/17 1538) Temp Source: Oral (06/17 1538) BP: 100/59 mmHg (06/17 2115) Pulse Rate: 82 (06/17 2115)  Labs:  Recent Labs  10/29/14 1549  WBC 2.6*  HGB 7.5*  PLT 175  CREATININE 0.62    Microbiology: No results found for this or any previous visit (from the past 720 hour(s)).  Medical History: Past Medical History  Diagnosis Date  . 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 Cancer 2015    tongue    Assessment: 44 year old male with a history of tongue cancer who is G tube dependent.  Pharmacy asked to dose vancomycin and Zosyn for ? Infected G-tube.  1st doses entered - not yet given  Goal of Therapy:  Vancomycin trough level 15-20 mcg/ml  Appropriate Zosyn dosing  Plan:  Vancomycin 1 gram iv x 1 dose now then 750 mg iv Q 12 Zosyn 3.375 grams iv Q 8 hours - 4 hr infusion Follow up cultures, Scr, fever trend, and progress  Thank you. Anette Guarneri, PharmD [phone]  10/29/2014,9:20 PM

## 2014-10-29 NOTE — ED Provider Notes (Signed)
CSN: [id number]     Arrival date & time 10/29/14  1514 History   First MD Initiated Contact with Patient 10/29/14 1850     Chief Complaint  Patient presents with  . g tube problems    g tube problems  . Hiccups     (Consider location/radiation/quality/duration/timing/severity/associated sxs/prior Treatment) HPI    PCP: No PCP Per Patient Blood pressure 114/67, pulse 59, temperature 97.4 F (36.3 C), temperature source Oral, resp. rate 18, SpO2 100 %.  Derek Campos is a 43 y.o.male with a significant PMH of JPEG placement due to being unable to swallow because of severe tongue and neck cancer presents to the ER with complaints of JPEG dysfunction. He is concerned that it may have come dislodged and infected. The symptoms started within the past few days. He also endorses having hiccups for 3 days and coughing up yellow sputum for the past couple of days as well. Had his last chemo near 5/23 and see's Dr. Marin Olp. Due to the cancer the patient has lost weight and has been weak at baseline. He is a poor historian. The patient has chronic neck and tongue pain, chronic dysphagia and aphagia, and endorses some SOB.   The patient denies diaphoresis, fever, headache, weakness  (focal), confusion, change of vision, chest pain,  back pain, abdominal pains, nausea, vomiting, diarrhea, lower extremity swelling, rash.     Past Medical History  Diagnosis Date  . 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 Cancer 2015    tongue   Past Surgical History  Procedure Laterality Date  . J peg    . Sp perc place gastric tube     Family History  Problem Relation Age of Onset  . Cancer Father   . Alcohol abuse Father    History  Substance Use Topics  . Smoking status: Former Smoker    Quit date: 09/12/2014  . Smokeless tobacco: Never Used     Comment: quit 09-12-14  . Alcohol Use: No    Review of Systems  10 Systems reviewed and are negative for acute change except as noted  in the HPI.     Allergies  Other  Home Medications   Prior to Admission medications   Medication Sig Start Date End Date Taking? Authorizing Provider  dexamethasone (DECADRON) 4 MG tablet Take 2 tablets (8 mg total) by mouth 2 (two) times daily. Start the day before Taxotere. Then again the day after chemo for 3 days. 09/27/14  Yes Volanda Napoleon, MD  ibuprofen (ADVIL,MOTRIN) 200 MG tablet Take 200 mg by mouth every 6 (six) hours as needed.   Yes Historical Provider, MD  lidocaine (XYLOCAINE) 2 % solution Use as directed 20 mLs in the mouth or throat every 3 (three) hours as needed for mouth pain. 10/25/14  Yes Volanda Napoleon, MD  oxycodone (ROXICODONE) 30 MG immediate release tablet Take 1-1 1/2 tablets, if needed, every 4 hrs for pain 10/25/14  Yes Volanda Napoleon, MD  prochlorperazine (COMPAZINE) 10 MG tablet Take 1 tablet (10 mg total) by mouth every 6 (six) hours as needed (Nausea or vomiting). 09/27/14  Yes Volanda Napoleon, MD  doxycycline (VIBRAMYCIN) 100 MG capsule Take 1 capsule (100 mg total) by mouth 2 (two) times daily. Patient not taking: Reported on 10/29/2014 09/27/14   Volanda Napoleon, MD  fentaNYL (DURAGESIC - DOSED MCG/HR) 75 MCG/HR Place 2 patches (150 mcg total) onto the skin every 3 (three) days.  Patient not taking: Reported on 10/29/2014 10/07/14   Volanda Napoleon, MD  gabapentin (NEURONTIN) 300 MG capsule Take 2 capsules (600 mg total) by mouth 4 (four) times daily. Patient not taking: Reported on 10/29/2014 09/08/14   Eliezer Bottom, NP  hydrocortisone cream 1 % Apply 1 application topically at bedtime. Apply to face, hands, feet, neck, back and chest daily at bedtime Patient not taking: Reported on 10/29/2014 09/27/14   Volanda Napoleon, MD  nicotine (NICODERM CQ - DOSED IN MG/24 HOURS) 14 mg/24hr patch Place 1 patch (14 mg total) onto the skin daily. For 6 weeks, then apply 7mg /24 hr for 2 weeks Patient not taking: Reported on 10/29/2014 08/11/14   Arnoldo Morale, MD    nystatin (MYCOSTATIN) 100000 UNIT/ML suspension Take 5 mLs (500,000 Units total) by mouth 4 (four) times daily. Patient not taking: Reported on 10/29/2014 10/04/14   [place], NP  ondansetron (ZOFRAN) 8 MG tablet Take 1 tablet (8 mg total) by mouth 2 (two) times daily. Start the day after chemo for 3 days. Then take as needed for nausea or vomiting. 09/27/14   Volanda Napoleon, MD   BP 100/56 mmHg  Pulse 67  Temp(Src) 97.4 F (36.3 C) (Oral)  Resp 20  SpO2 100% Physical Exam  Constitutional: He appears well-developed. He appears cachectic. No distress.  HENT:  Head: Normocephalic and atraumatic.  Large lesion to underside of patients tongue that is white and yellow in color.   Eyes: Pupils are equal, round, and reactive to light.  Neck: Normal range of motion. Neck supple.  Cardiovascular: Normal rate and regular rhythm.   Pulmonary/Chest: Effort normal. No accessory muscle usage. He has no decreased breath sounds. He has no wheezes. He has rhonchi. He has rales.  Abdominal: Soft. Bowel sounds are normal. He exhibits no distension and no fluid wave. There is no tenderness. There is no rebound and no CVA tenderness.    Neurological: He is alert.  Skin: Skin is warm and dry.  Nursing note and vitals reviewed.   ED Course  Procedures (including critical care time) Labs Review Labs Reviewed  CBC WITH DIFFERENTIAL/PLATELET - Abnormal; Notable for the following:    WBC 2.6 (*)    RBC 2.60 (*)    Hemoglobin 7.5 (*)    HCT 22.7 (*)    Lymphocytes Relative 7 (*)    Lymphs Abs 0.2 (*)    Monocytes Relative 23 (*)    All other components within normal limits  COMPREHENSIVE METABOLIC PANEL - Abnormal; Notable for the following:    Sodium 132 (*)    Chloride 88 (*)    CO2 34 (*)    Glucose, Bld 106 (*)    Calcium 8.5 (*)    Albumin 2.6 (*)    All other components within normal limits  CBC WITH DIFFERENTIAL/PLATELET - Abnormal; Notable for the following:    WBC 2.6 (*)     RBC 2.59 (*)    Hemoglobin 7.5 (*)    HCT 22.5 (*)    Lymphs Abs 0.3 (*)    Monocytes Relative 14 (*)    All other components within normal limits  COMPREHENSIVE METABOLIC PANEL - Abnormal; Notable for the following:    Sodium 129 (*)    Chloride 89 (*)    Glucose, Bld 119 (*)    Calcium 8.2 (*)    Total Protein 5.8 (*)    Albumin 2.4 (*)    ALT 16 (*)  All other components within normal limits  PROTIME-INR - Abnormal; Notable for the following:    Prothrombin Time 15.6 (*)    All other components within normal limits  APTT - Abnormal; Notable for the following:    aPTT 42 (*)    All other components within normal limits  FIBRINOGEN - Abnormal; Notable for the following:    Fibrinogen 726 (*)    All other components within normal limits  I-STAT CG4 LACTIC ACID, ED - Abnormal; Notable for the following:    Lactic Acid, Venous 0.45 (*)    All other components within normal limits  CULTURE, BLOOD (ROUTINE X 2)  CULTURE, BLOOD (ROUTINE X 2)  LACTIC ACID, PLASMA  PROCALCITONIN  LACTIC ACID, PLASMA  I-STAT CG4 LACTIC ACID, ED  TYPE AND SCREEN  ABO/RH   Imaging Review Ct Chest W Contrast  10/29/2014   CLINICAL DATA:  44 year old male with foul smelling leakage from peg tube. Current history of squamous cell oral cancer. Abnormal chest x-ray today. Subsequent encounter.  EXAM: CT CHEST, ABDOMEN, AND PELVIS WITH CONTRAST  TECHNIQUE: Multidetector CT imaging of the chest, abdomen and pelvis was performed following the standard protocol during bolus administration of intravenous contrast.  CONTRAST:  151mL OMNIPAQUE IOHEXOL 300 MG/ML  SOLN  COMPARISON:  [facility] PET-CT 09/03/2014  FINDINGS: CT CHEST FINDINGS  New large area of combined ground-glass opacity and consolidation in the right upper lobe. Chronic right apical cavitary lesion measuring 2.4 cm is stable (series 3, image 16), however, there is a new cavitary lesion with a fluid level at the epicenter of the  consolidated lung measuring up to 33 mm diameter. This is thick walled.  There is a small area of solid and ground-glass nodular opacity along the posterior aspect of the right middle lobe which is new. The right lower lobe is stable.  The left upper lobe is improved with regressed peri-bronchovascular and perihilar patchy opacity. An anterior left upper lobe cavity on image 20 is slightly larger but remains thin walled. This currently is 15 mm diameter.  The left lung otherwise is stable.  No pleural or pericardial effusions.  Abnormal low-density mediastinal soft tissue has enlarged since April and now encompasses 25 x 39 mm (AP by transverse). With mass effect on the SVC which remains patent, although the SVC may be invaded posteriorly with evidence of bland or tumor thrombus (series 2, image 35).  The thoracic aorta and proximal great vessels are patent. The central pulmonary arteries appear patent.  No axillary lymphadenopathy. No acute or suspicious osseous lesion in the chest.  CT ABDOMEN AND PELVIS FINDINGS  There is a new lytic lesion in the left L5 vertebra anteriorly (series 2, image 93). No other suspicious or acute osseous lesion identified.  Retained stool mixed with contrast in the colon. No dilated large or small bowel loops.  Motion artifact in the upper abdomen including at the level of the percutaneous gastrostomy tube. Mild soft tissue thickening along the course of the tube. The stomach is decompressed. There do does appear to be mild indistinct stranding around the lesser sac. No definite upper abdominal free fluid.  Liver, gallbladder,7 spleen, pancreas, and adrenal glands appear within normal limits allowing for motion. Major arterial structures in the abdomen and pelvis appear patent. Renal enhancement and contrast excretion within normal limits.  IMPRESSION: 1. New right upper lobe pneumonia with a new 3 cm central thick walled cavitary lesion containing a fluid level. Top differential  considerations include  pulmonary abscess and superinfected cavitary squamous cell lung metastasis. 2. Small focus of early infection in the right middle lobe. A small number of additional cavitary lung lesions remain indeterminate for scarring versus small mets. No pleural effusions. 3. Progressed mediastinal metastatic disease with evidence of invasion of the adjacent SVC and small nonocclusive tumor versus bland thrombus. 4. New L5 lytic bone lesion most compatible with bone metastasis. 5. Percutaneous gastrostomy tube appears normally placed. Suggestion of mild stranding around the stomach, might reflect infectious gastritis. Salient findings discussed by telephone with PA Kentley Blyden on 10/29/2014 at 23:56 .   Electronically Signed   By: Genevie Ann M.D.   On: 10/29/2014 23:59   Ct Abdomen Pelvis W Contrast  10/29/2014   CLINICAL DATA:  44 year old male with foul smelling leakage from peg tube. Current history of squamous cell oral cancer. Abnormal chest x-ray today. Subsequent encounter.  EXAM: CT CHEST, ABDOMEN, AND PELVIS WITH CONTRAST  TECHNIQUE: Multidetector CT imaging of the chest, abdomen and pelvis was performed following the standard protocol during bolus administration of intravenous contrast.  CONTRAST:  120mL OMNIPAQUE IOHEXOL 300 MG/ML  SOLN  COMPARISON:  [facility] PET-CT 09/03/2014  FINDINGS: CT CHEST FINDINGS  New large area of combined ground-glass opacity and consolidation in the right upper lobe. Chronic right apical cavitary lesion measuring 2.4 cm is stable (series 3, image 16), however, there is a new cavitary lesion with a fluid level at the epicenter of the consolidated lung measuring up to 33 mm diameter. This is thick walled.  There is a small area of solid and ground-glass nodular opacity along the posterior aspect of the right middle lobe which is new. The right lower lobe is stable.  The left upper lobe is improved with regressed peri-bronchovascular and perihilar patchy  opacity. An anterior left upper lobe cavity on image 20 is slightly larger but remains thin walled. This currently is 15 mm diameter.  The left lung otherwise is stable.  No pleural or pericardial effusions.  Abnormal low-density mediastinal soft tissue has enlarged since April and now encompasses 25 x 39 mm (AP by transverse). With mass effect on the SVC which remains patent, although the SVC may be invaded posteriorly with evidence of bland or tumor thrombus (series 2, image 35).  The thoracic aorta and proximal great vessels are patent. The central pulmonary arteries appear patent.  No axillary lymphadenopathy. No acute or suspicious osseous lesion in the chest.  CT ABDOMEN AND PELVIS FINDINGS  There is a new lytic lesion in the left L5 vertebra anteriorly (series 2, image 93). No other suspicious or acute osseous lesion identified.  Retained stool mixed with contrast in the colon. No dilated large or small bowel loops.  Motion artifact in the upper abdomen including at the level of the percutaneous gastrostomy tube. Mild soft tissue thickening along the course of the tube. The stomach is decompressed. There do does appear to be mild indistinct stranding around the lesser sac. No definite upper abdominal free fluid.  Liver, gallbladder,7 spleen, pancreas, and adrenal glands appear within normal limits allowing for motion. Major arterial structures in the abdomen and pelvis appear patent. Renal enhancement and contrast excretion within normal limits.  IMPRESSION: 1. New right upper lobe pneumonia with a new 3 cm central thick walled cavitary lesion containing a fluid level. Top differential considerations include pulmonary abscess and superinfected cavitary squamous cell lung metastasis. 2. Small focus of early infection in the right middle lobe. A small number of  additional cavitary lung lesions remain indeterminate for scarring versus small mets. No pleural effusions. 3. Progressed mediastinal metastatic disease  with evidence of invasion of the adjacent SVC and small nonocclusive tumor versus bland thrombus. 4. New L5 lytic bone lesion most compatible with bone metastasis. 5. Percutaneous gastrostomy tube appears normally placed. Suggestion of mild stranding around the stomach, might reflect infectious gastritis. Salient findings discussed by telephone with PA Ashlan Dignan on 10/29/2014 at 23:56 .   Electronically Signed   By: Genevie Ann M.D.   On: 10/29/2014 23:59   Dg Abd Acute W/chest  10/29/2014   CLINICAL DATA:  Current history of tongue cancer.  EXAM: DG ABDOMEN ACUTE W/ 1V CHEST  COMPARISON:  None.  FINDINGS: Gastrostomy tube is noted in left upper quadrant. No abnormal bowel gas pattern is noted. Stool is noted in the left colon and rectum. Cardiomediastinal silhouette appears normal. Ill-defined airspace opacity is noted in right upper lobe with cavitary lesion with air-fluid level concerning for abscess. Another cavitary lesion is seen more superiorly in right upper lobe concerning for infection or malignancy. Ill-defined opacity is noted in left upper lobe concerning for early inflammation.  IMPRESSION: No evidence of bowel obstruction or ileus. Gastrostomy tube seen in the left upper quadrant of the abdomen.  Right upper lobe opacity is noted most consistent with pneumonia with cavitary lesion in air-fluid level concerning for abscess. Another cavitary lesion is seen more superiorly in the right upper lobe concerning for abscess or metastatic lesion. Ill-defined density is also seen in left upper lobe concerning for inflammation. CT scan of the chest is recommended for further evaluation.   Electronically Signed   By: Marijo Conception, M.D.   On: 10/29/2014 20:07     EKG Interpretation None     MDM   Final diagnoses:  Abdominal pain  Gastritis  Pulmonary embolism  Oral cancer   CRITICAL CARE Performed by: Linus Mako Total critical care time: 35 Critical care time was exclusive of  separately billable procedures and treating other patients. Critical care was necessary to treat or prevent imminent or life-threatening deterioration. Critical care was time spent personally by me on the following activities: development of treatment plan with patient and/or surrogate as well as nursing, discussions with consultants, evaluation of patient's response to treatment, examination of patient, obtaining history from patient or surrogate, ordering and performing treatments and interventions, ordering and review of laboratory studies, ordering and review of radiographic studies, pulse oximetry and re-evaluation of patient's condition.  Dr. Alvy Bimler with oncology consulted, CBC shows patient not to be currently neutropenic.  The patients chest xray shows concerns for isolated pulmonary masses that appear to look like abscesses.  Dr.Patel with [facility] has agreed to admit the patient, recommend CT chest, abdomen and pelvis to further evaluate tube and it shows gastritis. The CT chest /abd/pelv is back and I discussed results with Dr. Posey Pronto who has assumed care.  Filed Vitals:   10/29/14 2230  BP: 100/56  Pulse: 67  Temp:   Resp:        Delos Haring, PA-C 10/30/14 0009  Lacretia Leigh, MD 10/30/14 2125

## 2014-10-30 DIAGNOSIS — G894 Chronic pain syndrome: Secondary | ICD-10-CM | POA: Diagnosis present

## 2014-10-30 DIAGNOSIS — T451X5A Adverse effect of antineoplastic and immunosuppressive drugs, initial encounter: Secondary | ICD-10-CM | POA: Diagnosis present

## 2014-10-30 DIAGNOSIS — Z681 Body mass index (BMI) 19 or less, adult: Secondary | ICD-10-CM | POA: Diagnosis not present

## 2014-10-30 DIAGNOSIS — E871 Hypo-osmolality and hyponatremia: Secondary | ICD-10-CM | POA: Diagnosis present

## 2014-10-30 DIAGNOSIS — I959 Hypotension, unspecified: Secondary | ICD-10-CM | POA: Diagnosis present

## 2014-10-30 DIAGNOSIS — Z91048 Other nonmedicinal substance allergy status: Secondary | ICD-10-CM | POA: Diagnosis not present

## 2014-10-30 DIAGNOSIS — Z931 Gastrostomy status: Secondary | ICD-10-CM

## 2014-10-30 DIAGNOSIS — J189 Pneumonia, unspecified organism: Secondary | ICD-10-CM | POA: Diagnosis present

## 2014-10-30 DIAGNOSIS — D61810 Antineoplastic chemotherapy induced pancytopenia: Secondary | ICD-10-CM | POA: Diagnosis present

## 2014-10-30 DIAGNOSIS — K9423 Gastrostomy malfunction: Secondary | ICD-10-CM | POA: Diagnosis present

## 2014-10-30 DIAGNOSIS — C799 Secondary malignant neoplasm of unspecified site: Secondary | ICD-10-CM | POA: Diagnosis present

## 2014-10-30 DIAGNOSIS — Y95 Nosocomial condition: Secondary | ICD-10-CM | POA: Diagnosis present

## 2014-10-30 DIAGNOSIS — C069 Malignant neoplasm of mouth, unspecified: Secondary | ICD-10-CM

## 2014-10-30 DIAGNOSIS — C781 Secondary malignant neoplasm of mediastinum: Secondary | ICD-10-CM | POA: Diagnosis present

## 2014-10-30 DIAGNOSIS — C7801 Secondary malignant neoplasm of right lung: Secondary | ICD-10-CM | POA: Diagnosis present

## 2014-10-30 DIAGNOSIS — Z85810 Personal history of malignant neoplasm of tongue: Secondary | ICD-10-CM | POA: Diagnosis not present

## 2014-10-30 DIAGNOSIS — I82210 Acute embolism and thrombosis of superior vena cava: Secondary | ICD-10-CM | POA: Diagnosis present

## 2014-10-30 DIAGNOSIS — C7951 Secondary malignant neoplasm of bone: Secondary | ICD-10-CM | POA: Diagnosis present

## 2014-10-30 DIAGNOSIS — Y833 Surgical operation with formation of external stoma as the cause of abnormal reaction of the patient, or of later complication, without mention of misadventure at the time of the procedure: Secondary | ICD-10-CM | POA: Diagnosis present

## 2014-10-30 DIAGNOSIS — R1310 Dysphagia, unspecified: Secondary | ICD-10-CM | POA: Diagnosis present

## 2014-10-30 DIAGNOSIS — F17210 Nicotine dependence, cigarettes, uncomplicated: Secondary | ICD-10-CM | POA: Diagnosis present

## 2014-10-30 DIAGNOSIS — J984 Other disorders of lung: Secondary | ICD-10-CM

## 2014-10-30 DIAGNOSIS — R066 Hiccough: Secondary | ICD-10-CM | POA: Diagnosis present

## 2014-10-30 DIAGNOSIS — J690 Pneumonitis due to inhalation of food and vomit: Secondary | ICD-10-CM | POA: Diagnosis present

## 2014-10-30 DIAGNOSIS — E440 Moderate protein-calorie malnutrition: Secondary | ICD-10-CM | POA: Diagnosis present

## 2014-10-30 DIAGNOSIS — Z9221 Personal history of antineoplastic chemotherapy: Secondary | ICD-10-CM | POA: Diagnosis not present

## 2014-10-30 DIAGNOSIS — K2970 Gastritis, unspecified, without bleeding: Secondary | ICD-10-CM | POA: Diagnosis present

## 2014-10-30 DIAGNOSIS — R590 Localized enlarged lymph nodes: Secondary | ICD-10-CM | POA: Diagnosis present

## 2014-10-30 DIAGNOSIS — A419 Sepsis, unspecified organism: Secondary | ICD-10-CM

## 2014-10-30 DIAGNOSIS — J850 Gangrene and necrosis of lung: Secondary | ICD-10-CM | POA: Diagnosis present

## 2014-10-30 DIAGNOSIS — I2699 Other pulmonary embolism without acute cor pulmonale: Secondary | ICD-10-CM | POA: Diagnosis present

## 2014-10-30 DIAGNOSIS — C029 Malignant neoplasm of tongue, unspecified: Secondary | ICD-10-CM | POA: Diagnosis present

## 2014-10-30 LAB — LACTIC ACID, PLASMA: Lactic Acid, Venous: 1.2 mmol/L (ref 0.5–2.0)

## 2014-10-30 LAB — CBC WITH DIFFERENTIAL/PLATELET
BASOS ABS: 0 10*3/uL (ref 0.0–0.1)
BASOS PCT: 0 % (ref 0–1)
Eosinophils Absolute: 0 10*3/uL (ref 0.0–0.7)
Eosinophils Relative: 0 % (ref 0–5)
HCT: 22.6 % — ABNORMAL LOW (ref 39.0–52.0)
HEMOGLOBIN: 7.5 g/dL — AB (ref 13.0–17.0)
Lymphocytes Relative: 7 % — ABNORMAL LOW (ref 12–46)
Lymphs Abs: 0.2 10*3/uL — ABNORMAL LOW (ref 0.7–4.0)
MCH: 29.4 pg (ref 26.0–34.0)
MCHC: 33.2 g/dL (ref 30.0–36.0)
MCV: 88.6 fL (ref 78.0–100.0)
MONOS PCT: 21 % — AB (ref 3–12)
Monocytes Absolute: 0.5 10*3/uL (ref 0.1–1.0)
NEUTROS ABS: 1.8 10*3/uL (ref 1.7–7.7)
NEUTROS PCT: 72 % (ref 43–77)
Platelets: 152 10*3/uL (ref 150–400)
RBC: 2.55 MIL/uL — ABNORMAL LOW (ref 4.22–5.81)
RDW: 13 % (ref 11.5–15.5)
WBC: 2.5 10*3/uL — ABNORMAL LOW (ref 4.0–10.5)

## 2014-10-30 LAB — MRSA PCR SCREENING: MRSA by PCR: NEGATIVE

## 2014-10-30 LAB — URINALYSIS, ROUTINE W REFLEX MICROSCOPIC
Bilirubin Urine: NEGATIVE
Glucose, UA: NEGATIVE mg/dL
HGB URINE DIPSTICK: NEGATIVE
KETONES UR: NEGATIVE mg/dL
Leukocytes, UA: NEGATIVE
NITRITE: NEGATIVE
Protein, ur: NEGATIVE mg/dL
Specific Gravity, Urine: 1.003 — ABNORMAL LOW (ref 1.005–1.030)
Urobilinogen, UA: 0.2 mg/dL (ref 0.0–1.0)
pH: 7.5 (ref 5.0–8.0)

## 2014-10-30 LAB — PROCALCITONIN: Procalcitonin: <0.1 ng/mL

## 2014-10-30 LAB — COMPREHENSIVE METABOLIC PANEL
ALT: 14 U/L — ABNORMAL LOW (ref 17–63)
AST: 17 U/L (ref 15–41)
Albumin: 2.1 g/dL — ABNORMAL LOW (ref 3.5–5.0)
Alkaline Phosphatase: 52 U/L (ref 38–126)
Anion gap: 8 (ref 5–15)
BILIRUBIN TOTAL: 0.4 mg/dL (ref 0.3–1.2)
BUN: 7 mg/dL (ref 6–20)
CO2: 28 mmol/L (ref 22–32)
CREATININE: 0.6 mg/dL — AB (ref 0.61–1.24)
Calcium: 7.8 mg/dL — ABNORMAL LOW (ref 8.9–10.3)
Chloride: 98 mmol/L — ABNORMAL LOW (ref 101–111)
GFR calc non Af Amer: >60 mL/min (ref >60)
Glucose, Bld: 97 mg/dL (ref 65–99)
Potassium: 3.8 mmol/L (ref 3.5–5.1)
Sodium: 134 mmol/L — ABNORMAL LOW (ref 135–145)
Total Protein: 5.6 g/dL — ABNORMAL LOW (ref 6.5–8.1)

## 2014-10-30 LAB — STREP PNEUMONIAE URINARY ANTIGEN: Strep Pneumo Urinary Antigen: NEGATIVE

## 2014-10-30 LAB — TYPE AND SCREEN
ABO/RH(D): O POS
Antibody Screen: NEGATIVE

## 2014-10-30 LAB — PROTIME-INR
INR: 1.26 (ref 0.00–1.49)
PROTHROMBIN TIME: 16 s — AB (ref 11.6–15.2)

## 2014-10-30 LAB — HIV ANTIBODY (ROUTINE TESTING W REFLEX): HIV SCREEN 4TH GENERATION: NONREACTIVE

## 2014-10-30 LAB — GLUCOSE, CAPILLARY: Glucose-Capillary: 118 mg/dL — ABNORMAL HIGH (ref 65–99)

## 2014-10-30 MED ORDER — SODIUM CHLORIDE 0.9 % IV BOLUS (SEPSIS)
1000.0000 mL | INTRAVENOUS | Status: AC
  Administered 2014-10-30 (×2): 1000 mL via INTRAVENOUS

## 2014-10-30 MED ORDER — GABAPENTIN 300 MG PO CAPS
600.0000 mg | ORAL_CAPSULE | Freq: Four times a day (QID) | ORAL | Status: DC
  Filled 2014-10-30 (×4): qty 2

## 2014-10-30 MED ORDER — OXYCODONE HCL 5 MG PO TABS
30.0000 mg | ORAL_TABLET | ORAL | Status: DC | PRN
  Administered 2014-10-30 – 2014-11-03 (×14): 30 mg via ORAL
  Filled 2014-10-30 (×14): qty 6

## 2014-10-30 MED ORDER — SODIUM CHLORIDE 0.9 % IV BOLUS (SEPSIS)
1000.0000 mL | Freq: Once | INTRAVENOUS | Status: AC
  Administered 2014-10-30: 1000 mL via INTRAVENOUS

## 2014-10-30 MED ORDER — JEVITY 1.5 CAL/FIBER PO LIQD
474.0000 mL | Freq: Two times a day (BID) | ORAL | Status: DC
  Administered 2014-10-30 – 2014-11-02 (×6): 474 mL
  Filled 2014-10-30 (×8): qty 1000

## 2014-10-30 MED ORDER — JEVITY 1.5 CAL/FIBER PO LIQD
237.0000 mL | Freq: Three times a day (TID) | ORAL | Status: DC
  Administered 2014-10-30: 237 mL
  Filled 2014-10-30 (×4): qty 1000

## 2014-10-30 MED ORDER — GABAPENTIN 600 MG PO TABS
600.0000 mg | ORAL_TABLET | Freq: Four times a day (QID) | ORAL | Status: DC
  Administered 2014-10-30 – 2014-10-31 (×4): 600 mg via ORAL
  Filled 2014-10-30 (×8): qty 1

## 2014-10-30 MED ORDER — SODIUM CHLORIDE 0.9 % IV SOLN
12.5000 mg | Freq: Three times a day (TID) | INTRAVENOUS | Status: DC | PRN
  Filled 2014-10-30: qty 0.5

## 2014-10-30 MED ORDER — LIDOCAINE VISCOUS 2 % MT SOLN
20.0000 mL | OROMUCOSAL | Status: DC | PRN
  Administered 2014-11-02 – 2014-11-03 (×3): 20 mL via OROMUCOSAL
  Filled 2014-10-30 (×4): qty 20

## 2014-10-30 MED ORDER — JEVITY 1.2 CAL PO LIQD: 1000.0000 mL | ORAL | Status: DC

## 2014-10-30 MED ORDER — ENOXAPARIN SODIUM 40 MG/0.4ML ~~LOC~~ SOLN
40.0000 mg | SUBCUTANEOUS | Status: DC
  Administered 2014-10-30 – 2014-11-02 (×4): 40 mg via SUBCUTANEOUS
  Filled 2014-10-30 (×5): qty 0.4

## 2014-10-30 MED ORDER — NICOTINE 14 MG/24HR TD PT24
14.0000 mg | MEDICATED_PATCH | Freq: Every day | TRANSDERMAL | Status: DC
  Administered 2014-10-30 – 2014-10-31 (×2): 14 mg via TRANSDERMAL
  Filled 2014-10-30 (×3): qty 1

## 2014-10-30 MED ORDER — SODIUM CHLORIDE 0.9 % IV SOLN
INTRAVENOUS | Status: DC
  Administered 2014-10-30 – 2014-11-01 (×4): via INTRAVENOUS

## 2014-10-30 MED ORDER — VORICONAZOLE 200 MG IV SOLR
6.0000 mg/kg | Freq: Two times a day (BID) | INTRAVENOUS | Status: DC
  Administered 2014-10-30: 330 mg via INTRAVENOUS
  Filled 2014-10-30 (×2): qty 330

## 2014-10-30 MED ORDER — VORICONAZOLE 200 MG IV SOLR: 4.0000 mg/kg | Freq: Two times a day (BID) | INTRAVENOUS | Status: DC

## 2014-10-30 MED ORDER — NYSTATIN 100000 UNIT/ML MT SUSP
5.0000 mL | Freq: Four times a day (QID) | OROMUCOSAL | Status: DC
  Administered 2014-10-30 – 2014-11-03 (×16): 500000 [IU] via ORAL
  Filled 2014-10-30 (×23): qty 5

## 2014-10-30 MED ORDER — FENTANYL 100 MCG/HR TD PT72
150.0000 ug | MEDICATED_PATCH | TRANSDERMAL | Status: DC
  Administered 2014-10-30 – 2014-11-02 (×2): 150 ug via TRANSDERMAL
  Filled 2014-10-30 (×4): qty 1

## 2014-10-30 MED ORDER — METRONIDAZOLE IN NACL 5-0.79 MG/ML-% IV SOLN
500.0000 mg | Freq: Three times a day (TID) | INTRAVENOUS | Status: DC
  Filled 2014-10-30: qty 100

## 2014-10-30 MED ORDER — JEVITY 1.5 CAL/FIBER PO LIQD
711.0000 mL | ORAL | Status: DC
  Administered 2014-10-31: 711 mL
  Filled 2014-10-30 (×4): qty 1000

## 2014-10-30 NOTE — Evaluation (Signed)
Clinical/Bedside Swallow Evaluation Patient Details  Name: Derek Campos MRN: [id number] Date of Birth: 1970/12/19  Today's Date: 10/30/2014 Time: SLP Start Time (ACUTE ONLY): 0240 SLP Stop Time (ACUTE ONLY): 0310 SLP Time Calculation (min) (ACUTE ONLY): 30 min  Past Medical History:  Past Medical History  Diagnosis Date  . 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 Cancer 2015    tongue   Past Surgical History:  Past Surgical History  Procedure Laterality Date  . J peg    . 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 CT scan of chest shows progressive cavitary nodules in both lungs with associated pneumonitis in the right lung.    Assessment / Plan / Recommendation Clinical Impression  Pt with moderate to severe oral dysphagia with suspected pharyngeal involvements as well. Pt post lingual resection secondary to carcinoma. Lingual range of motion and strength greatly impaired limiting bolus propulsion, base of tongue retraction, and AP transfer. Pt with multiple throat clears following thin liquid by cup. Moderate oral residue present following puree consistencies. Pt very aware of limitations and has developed his own coping strategies which appear unsafe. These include tilting head backwards to propel bolus from anterior to posterior portion of oral cavity. Educated regarding greater risk of aspiration with head back positioning, exposing the airway. Objective swallow study indicated to determine safest least restrictive diet and best compensatory strategies. Will attempt tomorrow. Continue NPO until MBS can be completed.     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 CT scan of chest shows progressive cavitary nodules in both lungs with associated pneumonitis in the right lung.  Type of Study: Bedside swallow evaluation Diet Prior to this Study: NPO Temperature Spikes Noted: No Respiratory Status: Room air History of Recent Intubation: No Behavior/Cognition: Alert;Cooperative Oral Cavity - Dentition: Adequate natural dentition/normal for age Self-Feeding Abilities: Able to feed self Patient Positioning: Upright in bed Baseline Vocal Quality: Normal Volitional Cough: Strong Volitional Swallow: Able to elicit    Oral/Motor/Sensory Function Overall Oral Motor/Sensory Function: Impaired Labial ROM: Reduced right;Reduced left Labial Strength: Reduced Lingual ROM: Reduced right;Reduced left Lingual Symmetry: Abnormal symmetry left;Abnormal symmetry right Lingual Strength: Reduced Facial Symmetry: Within Functional Limits   Ice Chips Ice chips: Not tested Presentation:  (cold temperatures bothersome to pt, who requested water)   Thin Liquid Thin Liquid: Impaired Presentation: Self Fed Oral Phase Impairments: Reduced lingual movement/coordination;Impaired anterior to posterior transit;Reduced labial seal Oral Phase Functional Implications: Prolonged oral transit;Oral residue Pharyngeal  Phase Impairments: Suspected delayed Swallow;Throat Clearing - Immediate;Multiple swallows    Nectar Thick Nectar Thick Liquid: Not tested   Honey Thick Honey Thick Liquid: Not tested   Puree Puree: Impaired Oral Phase Impairments: Reduced lingual  movement/coordination;Impaired anterior  to posterior transit;Impaired mastication;Poor awareness of bolus Oral Phase Functional Implications: Oral residue;Prolonged oral transit Pharyngeal Phase Impairments: Suspected delayed Swallow   Solid   GO    Solid: Not tested      Arvil Chaco MA, CCC-SLP Acute Care Speech Language Pathologist    Levi Aland 10/30/2014,3:13 PM

## 2014-10-30 NOTE — Progress Notes (Addendum)
See BSE report in notes

## 2014-10-30 NOTE — ED Notes (Signed)
Pt given bedside toilet

## 2014-10-30 NOTE — Progress Notes (Addendum)
[facility] Progress Note   Antuane Eastridge SWF:[id number] DOB: 08/30/1970 DOA: 10/29/2014 PCP: No PCP Per Patient  Brief narrative: Derek Campos is a 44 y.o. 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 He moved to [place] in March of this year in 2 weeks after the move he was found to have recurrence of his disease on the left side of his tongue with cervical lymphadenopathy and nodules in both lungs and is being treated currently with chemotherapy. He also has a PEG tube through which she receives feeds 4-5 times a day. He presented to the hospital for hiccups and leakage around his PEG tube. He admitted to having yellow-brown sputum for a couple of weeks. Chest x-ray revealed multiple pulmonary masses which appear to be abscesses. CT scan of the chest abdomen and pelvis was performed. He was found to have cavitary lesions with air-fluid levels in the chest and admitted for further treatment and workup.   Subjective: Has chronic pain in his tongue and chest. Has been coughing up brownish mucus for about 2 weeks now. No shortness of breath. No abdominal pain or diarrhea.  Assessment/Plan: Principal Problem:   HCAP (healthcare-associated pneumonia) -Cavitary lesions with air-fluid level in the right upper lobe lesion -Continue vancomycin and Zosyn-Will DC voriconazole as we have no solid evidence that this is fungal yet -Asked pulmonary to evaluate him for need for bronch-the patient states in the past when he's had a transthoracic biopsy of the lung nodules, they were negative for cancer -A recent PET scan showed that some of these nodules didn't "light up" which may be secondary to cancer or infection  Active Problems: Hypotension -Possibly inaccurate blood pressure readings-lactic acid is normal revealing that he is perfusing well  Hyponatremia -Started on IV fluids in the ER  with the suspicion that he is dehydrated    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 -due to the extent of the oral cancer, he may be aspirating. He did admit to Dr. Joya Gaskins that he sometimes coughs when he eats and drinks-have asked for a swallow eval but he is declining this stating that he has had them in the past and they have been negative- every discussed it with him and he states that he will allow [place] to perform    Chronic pain syndrome -cont chronic pain medications which include a fentanyl patch and oxycodone    Tobacco use disorder -Nicotine patch  Leukopenia/anemia -He is not neutropenic -Check anemia panel   Code Status: Full code DVT prophylaxis: Lovenox Consultants: Dr. Cliffton Asters Procedures:  Antibiotics: Anti-infectives    Start     Dose/Rate Route Frequency Ordered Stop   10/31/14 0300  voriconazole (VFEND) 220 mg in sodium chloride 0.9 % 100 mL IVPB  Status:  Discontinued     4 mg/kg  54.4 kg 61 mL/hr over 120 Minutes Intravenous Every 12 hours 10/30/14 0234  10/30/14 0812   10/30/14 0930  vancomycin (VANCOCIN) IVPB 750 mg/150 ml premix     750 mg 150 mL/hr over 60 Minutes Intravenous Every 12 hours 10/29/14 2127     10/30/14 0530  piperacillin-tazobactam (ZOSYN) IVPB 3.375 g     3.375 g 12.5 mL/hr over 240 Minutes Intravenous Every 8 hours 10/29/14 2127     10/30/14 0300  voriconazole (VFEND)  330 mg in sodium chloride 0.9 % 100 mL IVPB  Status:  Discontinued     6 mg/kg  54.4 kg 66.5 mL/hr over 120 Minutes Intravenous Every 12 hours 10/30/14 0234 10/30/14 0812   10/30/14 0200  metroNIDAZOLE (FLAGYL) IVPB 500 mg  Status:  Discontinued     500 mg 100 mL/hr over 60 Minutes Intravenous Every 8 hours 10/30/14 0149 10/30/14 0306   10/29/14 2100  vancomycin (VANCOCIN) IVPB 1000 mg/200 mL premix     1,000 mg 200 mL/hr over 60 Minutes Intravenous  Once 10/29/14 2046 10/30/14 0333   10/29/14 2100  piperacillin-tazobactam (ZOSYN) IVPB 3.375 g     3.375 g 12.5 mL/hr over 240 Minutes Intravenous  Once 10/29/14 2046 10/30/14 0135   10/29/14 2030  Ampicillin-Sulbactam (UNASYN) 3 g in sodium chloride 0.9 % 100 mL IVPB  Status:  Discontinued     3 g 100 mL/hr over 60 Minutes Intravenous  Once 10/29/14 2027 10/29/14 2046      Objective: Filed Weights   10/30/14 0200  Weight: 54.4 kg (119 lb 14.9 oz)    Intake/Output Summary (Last 24 hours) at 10/30/14 1206 Last data filed at 10/30/14 0550  Gross per 24 hour  Intake      0 ml  Output    300 ml  Net   -300 ml     Vitals Filed Vitals:   10/30/14 0615 10/30/14 0637 10/30/14 0754 10/30/14 1145  BP: 89/49 93/53 97/57  88/52  Pulse: 81 66 79 66  Temp:   98.6 F (37 C) 99 F (37.2 C)  TempSrc:   Oral Oral  Resp:   20 19  Weight:      SpO2: 98% 100% 97% 100%    Exam:  General:  Pt is alert, not in acute distress  HEENT: No icterus, No thrush, oral mucosa moist- necrotic tongue-missing anterior aspect of tongue  Cardiovascular: regular rate and rhythm, S1/S2 No murmur  Respiratory: clear to auscultation bilaterally   Abdomen: Soft, +Bowel sounds, non tender, non distended, no guarding  MSK: No LE edema, cyanosis or clubbing  Data Reviewed: Basic Metabolic Panel:  Recent Labs Lab 10/25/14 1018 10/29/14 1549 10/29/14 2216 10/30/14 0512  NA 137 132* 129* 134*  K 4.1 3.7 3.8 3.8  CL  --  88* 89* 98*  CO2 26 34* 32  28  GLUCOSE 192* 106* 119* 97  BUN 15.3 12 11 7   CREATININE 0.7 0.62 0.61 0.60*  CALCIUM 9.3 8.5* 8.2* 7.8*  MG 1.6  --   --   --    Liver Function Tests:  Recent Labs Lab 10/25/14 1018 10/29/14 1549 10/29/14 2216 10/30/14 0512  AST 16 23 20 17   ALT 12 17 16* 14*  ALKPHOS 74 54 53 52  BILITOT 0.20 0.3 0.4 0.4  PROT 7.0 6.5 5.8* 5.6*  ALBUMIN 2.9* 2.6* 2.4* 2.1*   No results for input(s): LIPASE, AMYLASE in the last 168 hours. No results for input(s): AMMONIA in the last 168 hours. CBC:  Recent Labs Lab 10/25/14 1012 10/29/14 1549 10/29/14 2216 10/30/14 9798  WBC 3.2* 2.6* 2.6* 2.5*  NEUTROABS 2.9 1.8 2.0 1.8  HGB 9.7* 7.5* 7.5* 7.5*  HCT 29.4* 22.7* 22.5* 22.6*  MCV 92 87.3 86.9 88.6  PLT 206 175 164 152   Cardiac Enzymes: No results for input(s): CKTOTAL, CKMB, CKMBINDEX, TROPONINI in the last 168 hours. BNP (last 3 results) No results for input(s): BNP in the last 8760 hours.  ProBNP (last 3 results) No results for input(s): PROBNP in the last 8760 hours.  CBG: No results for input(s): GLUCAP in the last 168 hours.  Recent Results (from the past 240 hour(s))  MRSA PCR Screening     Status: None   Collection Time: 10/30/14  7:30 AM  Result Value Ref Range Status   MRSA by PCR NEGATIVE NEGATIVE Final    Comment:        The GeneXpert MRSA Assay (FDA approved for NASAL specimens only), is one component of a comprehensive MRSA colonization surveillance program. It is not intended to diagnose MRSA infection nor to guide or monitor treatment for MRSA infections.      Studies: Ct Chest W Contrast  10/29/2014   CLINICAL DATA:  44 year old male with foul smelling leakage from peg tube. Current history of squamous cell oral cancer. Abnormal chest x-ray today. Subsequent encounter.  EXAM: CT CHEST, ABDOMEN, AND PELVIS WITH CONTRAST  TECHNIQUE: Multidetector CT imaging of the chest, abdomen and pelvis was performed following the standard protocol during  bolus administration of intravenous contrast.  CONTRAST:  165mL OMNIPAQUE IOHEXOL 300 MG/ML  SOLN  COMPARISON:  [facility] PET-CT 09/03/2014  FINDINGS: CT CHEST FINDINGS  New large area of combined ground-glass opacity and consolidation in the right upper lobe. Chronic right apical cavitary lesion measuring 2.4 cm is stable (series 3, image 16), however, there is a new cavitary lesion with a fluid level at the epicenter of the consolidated lung measuring up to 33 mm diameter. This is thick walled.  There is a small area of solid and ground-glass nodular opacity along the posterior aspect of the right middle lobe which is new. The right lower lobe is stable.  The left upper lobe is improved with regressed peri-bronchovascular and perihilar patchy opacity. An anterior left upper lobe cavity on image 20 is slightly larger but remains thin walled. This currently is 15 mm diameter.  The left lung otherwise is stable.  No pleural or pericardial effusions.  Abnormal low-density mediastinal soft tissue has enlarged since April and now encompasses 25 x 39 mm (AP by transverse). With mass effect on the SVC which remains patent, although the SVC may be invaded posteriorly with evidence of bland or tumor thrombus (series 2, image 35).  The thoracic aorta and proximal great vessels are patent. The central pulmonary arteries appear patent.  No axillary lymphadenopathy. No acute or suspicious osseous lesion in the chest.  CT ABDOMEN AND PELVIS FINDINGS  There is a new lytic lesion in the left L5 vertebra anteriorly (series 2, image 93). No other suspicious or acute osseous lesion identified.  Retained stool mixed with contrast in the colon. No dilated large or small bowel loops.  Motion artifact in the upper abdomen including at the level of the percutaneous gastrostomy tube. Mild soft tissue thickening along the course of the tube. The stomach is decompressed. There do does appear to be mild indistinct stranding around  the lesser sac. No definite upper abdominal free fluid.  Liver, gallbladder,7 spleen, pancreas, and adrenal glands appear within normal limits allowing for motion.  Major arterial structures in the abdomen and pelvis appear patent. Renal enhancement and contrast excretion within normal limits.  IMPRESSION: 1. New right upper lobe pneumonia with a new 3 cm central thick walled cavitary lesion containing a fluid level. Top differential considerations include pulmonary abscess and superinfected cavitary squamous cell lung metastasis. 2. Small focus of early infection in the right middle lobe. A small number of additional cavitary lung lesions remain indeterminate for scarring versus small mets. No pleural effusions. 3. Progressed mediastinal metastatic disease with evidence of invasion of the adjacent SVC and small nonocclusive tumor versus bland thrombus. 4. New L5 lytic bone lesion most compatible with bone metastasis. 5. Percutaneous gastrostomy tube appears normally placed. Suggestion of mild stranding around the stomach, might reflect infectious gastritis. Salient findings discussed by telephone with PA TIFFANY GREENE on 10/29/2014 at 23:56 .   Electronically Signed   By: Genevie Ann M.D.   On: 10/29/2014 23:59   Ct Abdomen Pelvis W Contrast  10/29/2014   CLINICAL DATA:  44 year old male with foul smelling leakage from peg tube. Current history of squamous cell oral cancer. Abnormal chest x-ray today. Subsequent encounter.  EXAM: CT CHEST, ABDOMEN, AND PELVIS WITH CONTRAST  TECHNIQUE: Multidetector CT imaging of the chest, abdomen and pelvis was performed following the standard protocol during bolus administration of intravenous contrast.  CONTRAST:  175mL OMNIPAQUE IOHEXOL 300 MG/ML  SOLN  COMPARISON:  [facility] PET-CT 09/03/2014  FINDINGS: CT CHEST FINDINGS  New large area of combined ground-glass opacity and consolidation in the right upper lobe. Chronic right apical cavitary lesion measuring 2.4 cm is  stable (series 3, image 16), however, there is a new cavitary lesion with a fluid level at the epicenter of the consolidated lung measuring up to 33 mm diameter. This is thick walled.  There is a small area of solid and ground-glass nodular opacity along the posterior aspect of the right middle lobe which is new. The right lower lobe is stable.  The left upper lobe is improved with regressed peri-bronchovascular and perihilar patchy opacity. An anterior left upper lobe cavity on image 20 is slightly larger but remains thin walled. This currently is 15 mm diameter.  The left lung otherwise is stable.  No pleural or pericardial effusions.  Abnormal low-density mediastinal soft tissue has enlarged since April and now encompasses 25 x 39 mm (AP by transverse). With mass effect on the SVC which remains patent, although the SVC may be invaded posteriorly with evidence of bland or tumor thrombus (series 2, image 35).  The thoracic aorta and proximal great vessels are patent. The central pulmonary arteries appear patent.  No axillary lymphadenopathy. No acute or suspicious osseous lesion in the chest.  CT ABDOMEN AND PELVIS FINDINGS  There is a new lytic lesion in the left L5 vertebra anteriorly (series 2, image 93). No other suspicious or acute osseous lesion identified.  Retained stool mixed with contrast in the colon. No dilated large or small bowel loops.  Motion artifact in the upper abdomen including at the level of the percutaneous gastrostomy tube. Mild soft tissue thickening along the course of the tube. The stomach is decompressed. There do does appear to be mild indistinct stranding around the lesser sac. No definite upper abdominal free fluid.  Liver, gallbladder,7 spleen, pancreas, and adrenal glands appear within normal limits allowing for motion. Major arterial structures in the abdomen and pelvis appear patent. Renal enhancement and contrast excretion within normal limits.  IMPRESSION: 1. New right upper  lobe pneumonia with a new 3 cm central thick walled cavitary lesion containing a fluid level. Top differential considerations include pulmonary abscess and superinfected cavitary squamous cell lung metastasis. 2. Small focus of early infection in the right middle lobe. A small number of additional cavitary lung lesions remain indeterminate for scarring versus small mets. No pleural effusions. 3. Progressed mediastinal metastatic disease with evidence of invasion of the adjacent SVC and small nonocclusive tumor versus bland thrombus. 4. New L5 lytic bone lesion most compatible with bone metastasis. 5. Percutaneous gastrostomy tube appears normally placed. Suggestion of mild stranding around the stomach, might reflect infectious gastritis. Salient findings discussed by telephone with PA TIFFANY GREENE on 10/29/2014 at 23:56 .   Electronically Signed   By: Genevie Ann M.D.   On: 10/29/2014 23:59   Dg Abd Acute W/chest  10/29/2014   CLINICAL DATA:  Current history of tongue cancer.  EXAM: DG ABDOMEN ACUTE W/ 1V CHEST  COMPARISON:  None.  FINDINGS: Gastrostomy tube is noted in left upper quadrant. No abnormal bowel gas pattern is noted. Stool is noted in the left colon and rectum. Cardiomediastinal silhouette appears normal. Ill-defined airspace opacity is noted in right upper lobe with cavitary lesion with air-fluid level concerning for abscess. Another cavitary lesion is seen more superiorly in right upper lobe concerning for infection or malignancy. Ill-defined opacity is noted in left upper lobe concerning for early inflammation.  IMPRESSION: No evidence of bowel obstruction or ileus. Gastrostomy tube seen in the left upper quadrant of the abdomen.  Right upper lobe opacity is noted most consistent with pneumonia with cavitary lesion in air-fluid level concerning for abscess. Another cavitary lesion is seen more superiorly in the right upper lobe concerning for abscess or metastatic lesion. Ill-defined density is also  seen in left upper lobe concerning for inflammation. CT scan of the chest is recommended for further evaluation.   Electronically Signed   By: Marijo Conception, M.D.   On: 10/29/2014 20:07    Scheduled Meds:  Scheduled Meds: . gabapentin  600 mg Oral QID  . nicotine  14 mg Transdermal Daily  . nystatin  5 mL Oral QID  . piperacillin-tazobactam (ZOSYN)  IV  3.375 g Intravenous Q8H  . vancomycin  750 mg Intravenous Q12H   Continuous Infusions: . sodium chloride 100 mL/hr at 10/30/14 0305    Time spent on care of this patient: 1 min   Seaman, MD 10/30/2014, 12:06 PM  LOS: 0 days   [facility] Office  [phone] Pager - Text Page per [email or web address] If 7PM-7AM, please contact night-coverage [email or web address]

## 2014-10-30 NOTE — Progress Notes (Addendum)
Initial Nutrition Assessment  DOCUMENTATION CODES: Non-severe (moderate) malnutrition in context of chronic illness  INTERVENTION: Tube feeding   Recommend 7 cans Jevity 1.5 via PEG. Split into 3 feeding of 2-3-2.  Tube feeding regimen provides 2488 kcal (100% of needs), 106 grams of protein, and 1260 ml of H2O.   Flush w/ 120 mls fluid before and after each feed  NUTRITION DIAGNOSIS: Increased nutrient needs related to cancer and cancer related treatments as evidenced by loss of 7.6% bw in 3 months  GOAL: Patient will meet greater than or equal to 90% of their needs  MONITOR: Diet advancement, PO intake, TF tolerance, Supplement acceptance, Labs, I & O's  REASON FOR ASSESSMENT: Consult Enteral/tube feeding reccomendations  ASSESSMENT: 43 y.o. 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 The patient is presenting with complaints of hiccups ongoing since last 2 days. CT shows PNA. Also noted leaking PEG tube  Pt reports that his home regimen is 8 cans of Jevity 1.2 split into 4 meals.  Provides: 2275 kcals, 105 g Pro, 1530 mls fluid.   He also reported having some oral intake too. He  drinks Ovaltine and eats a lot of ice cream.   Despite a high caloric intake, he reports that he keeps losing weight and can not stop no matter what he tries. He said he was going to ask to be bumped up to Jevity 1.5 next visit. He was very interested in trying it while he is admitted.   NFPE-Mild/mod muscle/fat wasting               Height: Ht Readings from Last 1 Encounters:  10/30/14 5\' 10"  (1.778 m)    Weight: Wt Readings from Last 1 Encounters:  10/30/14 130 lb 3.2 oz (59.058 kg)    Ideal Body Weight:  75.45 kg  Wt Readings from Last 10 Encounters:  10/30/14 130 lb 3.2 oz (59.058 kg)  10/12/14 120 lb (54.432 kg)  10/04/14 121 lb (54.885 kg)  09/22/14 122 lb (55.339 kg)  09/08/14 122 lb (55.339 kg)  09/06/14  125 lb (56.7 kg)  09/04/14 124 lb 9 oz (56.501 kg)  08/26/14 128 lb (58.06 kg)  08/20/14 136 lb (61.689 kg)  08/11/14 131 lb 6.4 oz (59.603 kg)  Admit/dosing weight: 120 lbs (54.54)  BMI:  Body mass index is 18.68 kg/(m^2).  BMI: 17.3 using dosing weight  Estimated Nutritional Needs: Kcal:  >2200 kcals (40 kcal/kg) Protein:  93-109 g (1.7-2 g/kg) Fluid:  1.9-2.1 liters  Skin: WDL  Diet Order:  Diet NPO time specified Except for: Sips with Meds, Ice Chips  EDUCATION NEEDS: No education needs identified at this time   Intake/Output Summary (Last 24 hours) at 10/30/14 1638 Last data filed at 10/30/14 0550  Gross per 24 hour  Intake      0 ml  Output    300 ml  Net   -300 ml    Last BM:  6/17  Burtis Junes RD, LDN Nutrition Pager: [phone] 10/30/2014 4:38 PM

## 2014-10-30 NOTE — Consult Note (Addendum)
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 Patient's initial therapy was in [place] for which she received radiation therapy followed by chemotherapy.  There was evidence of local disease initially however the patient has had evidence for progression with mediastinal mass present that is enlarged and bilateral lung nodules which are hot on PET scan. The patient does state he had a needle biopsy in [place] which did not reveal cancer in the lung.  The patient received chemotherapy under the guidance of Dr. Lutricia Feil starting in May 2016. The patient has received carboplatinum Taxotere and eributix  The patient has received 2 cycles of this chemotherapy. The patient has had increased tongue necrosis and pain from this therapy. He is also coughing up thick brown material. There is no hemoptysis. Pulmonary was asked to comment on the lung nodules and cavitary nature of same. The patient states he has had progressive difficulty in swallowing over a period of time since his diagnosis in August 2015  PAST MEDICAL HISTORY :   has a past medical history of Cancer and Cancer (2015).  has past surgical history that includes J peg and sp perc place gastric tube. Prior to Admission medications   Medication Sig Start Date End Date Taking? Authorizing Provider   dexamethasone (DECADRON) 4 MG tablet Take 2 tablets (8 mg total) by mouth 2 (two) times daily. Start the day before Taxotere. Then again the day after chemo for 3 days. 09/27/14  Yes Volanda Napoleon, MD  ibuprofen (ADVIL,MOTRIN) 200 MG tablet Take 200 mg by mouth every 6 (six) hours as needed.   Yes Historical Provider, MD  lidocaine (XYLOCAINE) 2 % solution Use as directed 20 mLs in the mouth or throat every 3 (three) hours as needed for mouth pain. 10/25/14  Yes Volanda Napoleon, MD  oxycodone (ROXICODONE) 30 MG immediate release tablet Take 1-1 1/2 tablets, if needed, every 4 hrs for pain 10/25/14  Yes Volanda Napoleon, MD  prochlorperazine (COMPAZINE) 10 MG tablet Take 1 tablet (10 mg total) by mouth every 6 (six) hours as needed (Nausea or vomiting). 09/27/14  Yes Volanda Napoleon, MD  doxycycline (VIBRAMYCIN) 100 MG capsule Take 1 capsule (100 mg total) by mouth 2 (two) times daily. Patient not taking: Reported on 10/29/2014 09/27/14   Volanda Napoleon, MD  fentaNYL (DURAGESIC - DOSED MCG/HR) 75 MCG/HR Place 2 patches (150 mcg total) onto the skin every 3 (three) days. Patient not taking: Reported on 10/29/2014 10/07/14   Volanda Napoleon, MD  gabapentin (NEURONTIN) 300 MG capsule Take 2 capsules (600 mg total) by mouth 4 (four) times daily. Patient not taking: Reported on 10/29/2014 09/08/14   Eliezer Bottom, NP  hydrocortisone cream 1 % Apply 1 application topically at bedtime. Apply to face, hands, feet, neck, back and chest daily at bedtime Patient not taking: Reported on 10/29/2014 09/27/14  Volanda Napoleon, MD  nicotine (NICODERM CQ - DOSED IN MG/24 HOURS) 14 mg/24hr patch Place 1 patch (14 mg total) onto the skin daily. For 6 weeks, then apply 7mg /24 hr for 2 weeks Patient not taking: Reported on 10/29/2014 08/11/14   Arnoldo Morale, MD  nystatin (MYCOSTATIN) 100000 UNIT/ML suspension Take 5 mLs (500,000 Units total) by mouth 4 (four) times daily. Patient not taking: Reported on 10/29/2014 10/04/14    [place], NP  ondansetron (ZOFRAN) 8 MG tablet Take 1 tablet (8 mg total) by mouth 2 (two) times daily. Start the day after chemo for 3 days. Then take as needed for nausea or vomiting. 09/27/14   Volanda Napoleon, MD   Allergies  Allergen Reactions  . Other     Cat Gut Stitches - unknown reaction.Told by parents.    FAMILY HISTORY:  family history includes Alcohol abuse in his father; Cancer in his father. SOCIAL HISTORY:  reports that he quit smoking about 6 weeks ago. He has never used smokeless tobacco. He reports that he does not drink alcohol or use illicit drugs.  REVIEW OF SYSTEMS:   Positive in bold Constitutional: Negative for fever, chills, weight loss, malaise/fatigue and diaphoresis.  HENT: hearing loss, ear pain, nosebleeds, congestion, sore throat, neck pain, tinnitus and ear discharge.   note history of difficulty swallowing Eyes: Negative for blurred vision, double vision, photophobia, pain, discharge and redness.  Respiratory: cough, hemoptysis, sputum production, shortness of breath, wheezing and stridor.   Cardiovascular: Negative for chest pain, palpitations, orthopnea, claudication, leg swelling and PND.  Gastrointestinal: Negative for heartburn, nausea, vomiting, abdominal pain, diarrhea, constipation, blood in stool and melena.  Genitourinary: Negative for dysuria, urgency, frequency, hematuria and flank pain.  Musculoskeletal: Negative for myalgias, back pain, joint pain and falls.  Skin: Negative for itching and rash.  Neurological: Negative for dizziness, tingling, tremors, sensory change, speech change, focal weakness, seizures, loss of consciousness, weakness and headaches.  Endo/Heme/Allergies: Negative for environmental allergies and polydipsia. Does not bruise/bleed easily.  SUBJECTIVE:   VITAL SIGNS: Temp:  [97.4 F (36.3 C)-98.6 F (37 C)] 98.6 F (37 C) (06/18 0754) Pulse Rate:  [26-88] 79 (06/18 0754) Resp:  [16-20] 20 (06/18 0754) BP:  (77-125)/(38-74) 97/57 mmHg (06/18 0754) SpO2:  [90 %-100 %] 97 % (06/18 0754) Weight:  [54.4 kg (119 lb 14.9 oz)] 54.4 kg (119 lb 14.9 oz) (06/18 0200)  PHYSICAL EXAMINATION: General:  Thin white male in no acute distress Neuro:  Awake and alert no focal deficits HEENT:  Necrotic tongue seen with evidence of malignancy Cardiovascular:  Regular rate and rhythm normal S1-S2 no S3 or S4 Lungs:  Clear without rales or rhonchi Abdomen:  PEG tube in place soft nontender bowel sounds active,  erythema around PEG tube seen Musculoskeletal:  Full range of motion Skin:  Clear   Recent Labs Lab 10/29/14 1549 10/29/14 2216 10/30/14 0512  NA 132* 129* 134*  K 3.7 3.8 3.8  CL 88* 89* 98*  CO2 34* 32 28  BUN 12 11 7   CREATININE 0.62 0.61 0.60*  GLUCOSE 106* 119* 97    Recent Labs Lab 10/29/14 1549 10/29/14 2216 10/30/14 0512  HGB 7.5* 7.5* 7.5*  HCT 22.7* 22.5* 22.6*  WBC 2.6* 2.6* 2.5*  PLT 175 164 152   Ct Chest W Contrast  10/29/2014   CLINICAL DATA:  44 year old male with foul smelling leakage from peg tube. Current history of squamous cell oral cancer. Abnormal chest x-ray today. Subsequent encounter.  EXAM: CT CHEST, ABDOMEN, AND PELVIS WITH CONTRAST  TECHNIQUE: Multidetector CT imaging of the chest, abdomen and pelvis was performed following the standard protocol during bolus administration of intravenous contrast.  CONTRAST:  178mL OMNIPAQUE IOHEXOL 300 MG/ML  SOLN  COMPARISON:  [facility] PET-CT 09/03/2014  FINDINGS: CT CHEST FINDINGS  New large area of combined ground-glass opacity and consolidation in the right upper lobe. Chronic right apical cavitary lesion measuring 2.4 cm is stable (series 3, image 16), however, there is a new cavitary lesion with a fluid level at the epicenter of the consolidated lung measuring up to 33 mm diameter. This is thick walled.  There is a small area of solid and ground-glass nodular opacity along the posterior aspect of the right middle  lobe which is new. The right lower lobe is stable.  The left upper lobe is improved with regressed peri-bronchovascular and perihilar patchy opacity. An anterior left upper lobe cavity on image 20 is slightly larger but remains thin walled. This currently is 15 mm diameter.  The left lung otherwise is stable.  No pleural or pericardial effusions.  Abnormal low-density mediastinal soft tissue has enlarged since April and now encompasses 25 x 39 mm (AP by transverse). With mass effect on the SVC which remains patent, although the SVC may be invaded posteriorly with evidence of bland or tumor thrombus (series 2, image 35).  The thoracic aorta and proximal great vessels are patent. The central pulmonary arteries appear patent.  No axillary lymphadenopathy. No acute or suspicious osseous lesion in the chest.  CT ABDOMEN AND PELVIS FINDINGS  There is a new lytic lesion in the left L5 vertebra anteriorly (series 2, image 93). No other suspicious or acute osseous lesion identified.  Retained stool mixed with contrast in the colon. No dilated large or small bowel loops.  Motion artifact in the upper abdomen including at the level of the percutaneous gastrostomy tube. Mild soft tissue thickening along the course of the tube. The stomach is decompressed. There do does appear to be mild indistinct stranding around the lesser sac. No definite upper abdominal free fluid.  Liver, gallbladder,7 spleen, pancreas, and adrenal glands appear within normal limits allowing for motion. Major arterial structures in the abdomen and pelvis appear patent. Renal enhancement and contrast excretion within normal limits.  IMPRESSION: 1. New right upper lobe pneumonia with a new 3 cm central thick walled cavitary lesion containing a fluid level. Top differential considerations include pulmonary abscess and superinfected cavitary squamous cell lung metastasis. 2. Small focus of early infection in the right middle lobe. A small number of additional  cavitary lung lesions remain indeterminate for scarring versus small mets. No pleural effusions. 3. Progressed mediastinal metastatic disease with evidence of invasion of the adjacent SVC and small nonocclusive tumor versus bland thrombus. 4. New L5 lytic bone lesion most compatible with bone metastasis. 5. Percutaneous gastrostomy tube appears normally placed. Suggestion of mild stranding around the stomach, might reflect infectious gastritis. Salient findings discussed by telephone with PA TIFFANY GREENE on 10/29/2014 at 23:56 .   Electronically Signed   By: Genevie Ann M.D.   On: 10/29/2014 23:59   Ct Abdomen Pelvis W Contrast  10/29/2014   CLINICAL DATA:  44 year old male with foul smelling leakage from peg tube. Current history of squamous cell oral cancer. Abnormal chest x-ray today. Subsequent encounter.  EXAM: CT CHEST, ABDOMEN, AND PELVIS WITH CONTRAST  TECHNIQUE: Multidetector CT imaging of the chest, abdomen and pelvis was performed following the  standard protocol during bolus administration of intravenous contrast.  CONTRAST:  160mL OMNIPAQUE IOHEXOL 300 MG/ML  SOLN  COMPARISON:  [facility] PET-CT 09/03/2014  FINDINGS: CT CHEST FINDINGS  New large area of combined ground-glass opacity and consolidation in the right upper lobe. Chronic right apical cavitary lesion measuring 2.4 cm is stable (series 3, image 16), however, there is a new cavitary lesion with a fluid level at the epicenter of the consolidated lung measuring up to 33 mm diameter. This is thick walled.  There is a small area of solid and ground-glass nodular opacity along the posterior aspect of the right middle lobe which is new. The right lower lobe is stable.  The left upper lobe is improved with regressed peri-bronchovascular and perihilar patchy opacity. An anterior left upper lobe cavity on image 20 is slightly larger but remains thin walled. This currently is 15 mm diameter.  The left lung otherwise is stable.  No pleural or  pericardial effusions.  Abnormal low-density mediastinal soft tissue has enlarged since April and now encompasses 25 x 39 mm (AP by transverse). With mass effect on the SVC which remains patent, although the SVC may be invaded posteriorly with evidence of bland or tumor thrombus (series 2, image 35).  The thoracic aorta and proximal great vessels are patent. The central pulmonary arteries appear patent.  No axillary lymphadenopathy. No acute or suspicious osseous lesion in the chest.  CT ABDOMEN AND PELVIS FINDINGS  There is a new lytic lesion in the left L5 vertebra anteriorly (series 2, image 93). No other suspicious or acute osseous lesion identified.  Retained stool mixed with contrast in the colon. No dilated large or small bowel loops.  Motion artifact in the upper abdomen including at the level of the percutaneous gastrostomy tube. Mild soft tissue thickening along the course of the tube. The stomach is decompressed. There do does appear to be mild indistinct stranding around the lesser sac. No definite upper abdominal free fluid.  Liver, gallbladder,7 spleen, pancreas, and adrenal glands appear within normal limits allowing for motion. Major arterial structures in the abdomen and pelvis appear patent. Renal enhancement and contrast excretion within normal limits.  IMPRESSION: 1. New right upper lobe pneumonia with a new 3 cm central thick walled cavitary lesion containing a fluid level. Top differential considerations include pulmonary abscess and superinfected cavitary squamous cell lung metastasis. 2. Small focus of early infection in the right middle lobe. A small number of additional cavitary lung lesions remain indeterminate for scarring versus small mets. No pleural effusions. 3. Progressed mediastinal metastatic disease with evidence of invasion of the adjacent SVC and small nonocclusive tumor versus bland thrombus. 4. New L5 lytic bone lesion most compatible with bone metastasis. 5. Percutaneous  gastrostomy tube appears normally placed. Suggestion of mild stranding around the stomach, might reflect infectious gastritis. Salient findings discussed by telephone with PA TIFFANY GREENE on 10/29/2014 at 23:56 .   Electronically Signed   By: Genevie Ann M.D.   On: 10/29/2014 23:59   Dg Abd Acute W/chest  10/29/2014   CLINICAL DATA:  Current history of tongue cancer.  EXAM: DG ABDOMEN ACUTE W/ 1V CHEST  COMPARISON:  None.  FINDINGS: Gastrostomy tube is noted in left upper quadrant. No abnormal bowel gas pattern is noted. Stool is noted in the left colon and rectum. Cardiomediastinal silhouette appears normal. Ill-defined airspace opacity is noted in right upper lobe with cavitary lesion with air-fluid level concerning for abscess. Another cavitary lesion is seen  more superiorly in right upper lobe concerning for infection or malignancy. Ill-defined opacity is noted in left upper lobe concerning for early inflammation.  IMPRESSION: No evidence of bowel obstruction or ileus. Gastrostomy tube seen in the left upper quadrant of the abdomen.  Right upper lobe opacity is noted most consistent with pneumonia with cavitary lesion in air-fluid level concerning for abscess. Another cavitary lesion is seen more superiorly in the right upper lobe concerning for abscess or metastatic lesion. Ill-defined density is also seen in left upper lobe concerning for inflammation. CT scan of the chest is recommended for further evaluation.   Electronically Signed   By: Marijo Conception, M.D.   On: 10/29/2014 20:07   NOTE CT CHEST FROM 09/2014 at [facility]: CT Chest W Contrast - Final result (09/16/2014 2:51 PM) CT Chest W Contrast - Final result (09/16/2014 2:51 PM)  Impressions    1. Similar appearance of multiple irregular and spiculated pulmonary nodules, majority of which are cavitary, consistent with metastatic disease.   2. Suspected endobronchial metastases within the lingula as described above and mild lingular  postobstructive pneumonia/pneumonitis.  3. Near resolution of previously visualized left upper lobe airspace consolidation consistent with inflammation/infection with multiple residual ground glass opacities persisting, likely representing residual infection however, additional sites of metastatic disease are not excluded. Continued followup is anticipated.  4. Enlarging necrotic lower right paratracheal/precarinal nodal mass with significant compression and likely invasion of the superior vena cava.  5. Mild bilateral pleural nodularity and thickening, possibly postinflammatory or reactive though metastatic involvement cannot be excluded. Continued followup anticipated.     ASSESSMENT / PLAN:  #1 progression in bilateral lung nodules with cavitary lesions seen in right upper lobe nodule with air-fluid level. There is also not mass like density seen in the mediastinum and lytic lesion in L5 vertebra. These findings are consistent with metastatic squamous cell carcinoma tongue however there is surrounding pneumonitis in the right upper lobe and the patient has had difficulty with swallowing and I suspect some of the process is from abscess formation with chronic aspiration pneumonitis.   Note outside CT scan has been obtained from [facility] in May 2016 and did suggest endobronchial metastases within the left lingula with postobstructive pneumonitis a similar process may well be occurring in the right lobe as well. There is also enlarging necrotic right lower paratracheal height precarinal nodal mass with compression and invasion of the superior vena cava. There is pleural nodularity and thickening compatible with metastatic involvement as well. Most of the pulmonary nodules are irregular and spiculated and are consistent with metastatic disease.   Plan   May give consideration to bronchoscopy for further evaluation   Continue Zosyn and vancomycin for now   I agree with discontinuing any  fungal therapy   I would make the patient strictly in by mouth and use the feeding tube for all access   The patient likely can be managed at a lower level of care on the medical surgical floor  Mariel Sleet [place]  Cell  [phone]  If no response or cell goes to voicemail, call beeper [phone]  Pulmonary and [place] Pager: [phone]  10/30/2014, 8:14 AM

## 2014-10-30 NOTE — H&P (Signed)
[facility] History and Physical  Patient: Derek Campos  MRN: [id number]  DOB: August 15, 1970  DOS: the patient was seen and examined on 10/30/2014 PCP: No PCP Per Patient  Chief Complaint: Hiccups  HPI: Derek Campos is a 44 y.o. 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 The patient is presenting with complaints of hiccups ongoing since last 2 days. He also has cough with brownish expectoration ongoing since last few weeks. He complains of fever and chills. He denies any chest pain or abdominal pain nausea or vomiting diarrhea or constipation. He also denies any burning urination. He has a PEG tube which while he was trying to flush it felt that his tube was leaking from his side. He denies any fall trauma or injury. He denies any active bleeding. He denies any focal deficit.  The patient is coming from home. And at his baseline independent for most of his ADL.  Review of Systems: as mentioned in the history of present illness.  A comprehensive review of the other systems is negative.  Past Medical History  Diagnosis Date  . 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 Cancer 2015    tongue   Past Surgical History  Procedure Laterality Date  . J peg    . Sp perc place gastric tube     Social History:  reports that he quit smoking about 6 weeks ago. He has never used smokeless tobacco. He reports that he does not drink alcohol or use illicit drugs.  Allergies  Allergen Reactions  . Other     Cat Gut Stitches - unknown reaction.Told by parents.    Family History  Problem Relation Age of Onset  . Cancer Father   . Alcohol abuse Father     Prior to Admission medications   Medication Sig Start Date End Date Taking? Authorizing Provider  dexamethasone (DECADRON) 4 MG tablet Take 2 tablets (8 mg total) by mouth 2 (two) times daily. Start the day before Taxotere. Then again the  day after chemo for 3 days. 09/27/14  Yes Volanda Napoleon, MD  ibuprofen (ADVIL,MOTRIN) 200 MG tablet Take 200 mg by mouth every 6 (six) hours as needed.   Yes Historical Provider, MD  lidocaine (XYLOCAINE) 2 % solution Use as directed 20 mLs in the mouth or throat every 3 (three) hours as needed for mouth pain. 10/25/14  Yes Volanda Napoleon, MD  oxycodone (ROXICODONE) 30 MG immediate release tablet Take 1-1 1/2 tablets, if needed, every 4 hrs for pain 10/25/14  Yes Volanda Napoleon, MD  prochlorperazine (COMPAZINE) 10 MG tablet Take 1 tablet (10 mg total) by mouth every 6 (six) hours as needed (Nausea or vomiting). 09/27/14  Yes Volanda Napoleon, MD  doxycycline (VIBRAMYCIN) 100 MG capsule Take 1 capsule (100 mg total) by mouth 2 (two) times daily. Patient not taking: Reported on 10/29/2014 09/27/14   Volanda Napoleon, MD  fentaNYL (DURAGESIC - DOSED MCG/HR) 75 MCG/HR Place 2 patches (150 mcg total) onto the skin every 3 (three) days. Patient not taking: Reported on 10/29/2014 10/07/14   Volanda Napoleon, MD  gabapentin (NEURONTIN) 300 MG capsule Take 2 capsules (600 mg total) by mouth 4 (four) times daily. Patient not taking: Reported on 10/29/2014 09/08/14   Eliezer Bottom, NP  hydrocortisone cream 1 % Apply 1 application topically at bedtime. Apply to face, hands, feet, neck, back and  chest daily at bedtime Patient not taking: Reported on 10/29/2014 09/27/14   Volanda Napoleon, MD  nicotine (NICODERM CQ - DOSED IN MG/24 HOURS) 14 mg/24hr patch Place 1 patch (14 mg total) onto the skin daily. For 6 weeks, then apply 7mg /24 hr for 2 weeks Patient not taking: Reported on 10/29/2014 08/11/14   Arnoldo Morale, MD  nystatin (MYCOSTATIN) 100000 UNIT/ML suspension Take 5 mLs (500,000 Units total) by mouth 4 (four) times daily. Patient not taking: Reported on 10/29/2014 10/04/14   Nora, NP  ondansetron (ZOFRAN) 8 MG tablet Take 1 tablet (8 mg total) by mouth 2 (two) times daily. Start the day after chemo for  3 days. Then take as needed for nausea or vomiting. 09/27/14   Volanda Napoleon, MD    Physical Exam: Filed Vitals:   10/30/14 0300 10/30/14 0315 10/30/14 0330 10/30/14 0345  BP: 87/50 84/51 79/45  79/42  Pulse: 26 66 70 65  Temp:      TempSrc:      Resp:      Weight:      SpO2: 90% 98% 98% 97%    General: Alert, Awake and Oriented to Time, Place and Person. Appear in marked distress Eyes: PERRL ENT: Oral Mucosa clear dry. Neck: NO JVD Cardiovascular: S1 and S2 Present, NO Murmur, Peripheral Pulses Present Respiratory: Bilateral Air entry equal and Decreased,  Bilateral Crackles, occasional wheezes Abdomen: Bowel Sound present, Soft and non tender Mild redness around his G-tube no tenderness no discharge Skin: no Rash Extremities: no Pedal edema, no calf tenderness Neurologic: Grossly no focal neuro deficit.  Labs on Admission:  CBC:  Recent Labs Lab 10/25/14 1012 10/29/14 1549 10/29/14 2216  WBC 3.2* 2.6* 2.6*  NEUTROABS 2.9 1.8 2.0  HGB 9.7* 7.5* 7.5*  HCT 29.4* 22.7* 22.5*  MCV 92 87.3 86.9  PLT 206 175 164    CMP     Component Value Date/Time   NA 129* 10/29/2014 2216   NA 137 10/25/2014 1018   NA 134 10/18/2014 1029   K 3.8 10/29/2014 2216   K 4.1 10/25/2014 1018   K 4.0 10/18/2014 1029   CL 89* 10/29/2014 2216   CL 95* 10/18/2014 1029   CO2 32 10/29/2014 2216   CO2 26 10/25/2014 1018   CO2 32 10/18/2014 1029   GLUCOSE 119* 10/29/2014 2216   GLUCOSE 192* 10/25/2014 1018   GLUCOSE 112 10/18/2014 1029   BUN 11 10/29/2014 2216   BUN 15.3 10/25/2014 1018   BUN 14 10/18/2014 1029   CREATININE 0.61 10/29/2014 2216   CREATININE 0.7 10/25/2014 1018   CREATININE 0.7 10/18/2014 1029   CALCIUM 8.2* 10/29/2014 2216   CALCIUM 9.3 10/25/2014 1018   CALCIUM 9.2 10/18/2014 1029   PROT 5.8* 10/29/2014 2216   PROT 7.0 10/25/2014 1018   PROT 6.8 10/18/2014 1029   ALBUMIN 2.4* 10/29/2014 2216   ALBUMIN 2.9* 10/25/2014 1018   AST 20 10/29/2014 2216   AST 16  10/25/2014 1018   AST 20 10/18/2014 1029   ALT 16* 10/29/2014 2216   ALT 12 10/25/2014 1018   ALT 16 10/18/2014 1029   ALKPHOS 53 10/29/2014 2216   ALKPHOS 74 10/25/2014 1018   ALKPHOS 74 10/18/2014 1029   BILITOT 0.4 10/29/2014 2216   BILITOT 0.20 10/25/2014 1018   BILITOT 0.50 10/18/2014 1029   GFRNONAA >60 10/29/2014 2216   GFRAA >60 10/29/2014 2216    No results for input(s): LIPASE, AMYLASE in the last 168 hours.  No results for input(s): CKTOTAL, CKMB, CKMBINDEX, TROPONINI in the last 168 hours. BNP (last 3 results) No results for input(s): BNP in the last 8760 hours.  ProBNP (last 3 results) No results for input(s): PROBNP in the last 8760 hours.   Radiological Exams on Admission: Ct Chest W Contrast  10/29/2014   CLINICAL DATA:  44 year old male with foul smelling leakage from peg tube. Current history of squamous cell oral cancer. Abnormal chest x-ray today. Subsequent encounter.  EXAM: CT CHEST, ABDOMEN, AND PELVIS WITH CONTRAST  TECHNIQUE: Multidetector CT imaging of the chest, abdomen and pelvis was performed following the standard protocol during bolus administration of intravenous contrast.  CONTRAST:  131mL OMNIPAQUE IOHEXOL 300 MG/ML  SOLN  COMPARISON:  [facility] PET-CT 09/03/2014  FINDINGS: CT CHEST FINDINGS  New large area of combined ground-glass opacity and consolidation in the right upper lobe. Chronic right apical cavitary lesion measuring 2.4 cm is stable (series 3, image 16), however, there is a new cavitary lesion with a fluid level at the epicenter of the consolidated lung measuring up to 33 mm diameter. This is thick walled.  There is a small area of solid and ground-glass nodular opacity along the posterior aspect of the right middle lobe which is new. The right lower lobe is stable.  The left upper lobe is improved with regressed peri-bronchovascular and perihilar patchy opacity. An anterior left upper lobe cavity on image 20 is slightly larger but  remains thin walled. This currently is 15 mm diameter.  The left lung otherwise is stable.  No pleural or pericardial effusions.  Abnormal low-density mediastinal soft tissue has enlarged since April and now encompasses 25 x 39 mm (AP by transverse). With mass effect on the SVC which remains patent, although the SVC may be invaded posteriorly with evidence of bland or tumor thrombus (series 2, image 35).  The thoracic aorta and proximal great vessels are patent. The central pulmonary arteries appear patent.  No axillary lymphadenopathy. No acute or suspicious osseous lesion in the chest.  CT ABDOMEN AND PELVIS FINDINGS  There is a new lytic lesion in the left L5 vertebra anteriorly (series 2, image 93). No other suspicious or acute osseous lesion identified.  Retained stool mixed with contrast in the colon. No dilated large or small bowel loops.  Motion artifact in the upper abdomen including at the level of the percutaneous gastrostomy tube. Mild soft tissue thickening along the course of the tube. The stomach is decompressed. There do does appear to be mild indistinct stranding around the lesser sac. No definite upper abdominal free fluid.  Liver, gallbladder,7 spleen, pancreas, and adrenal glands appear within normal limits allowing for motion. Major arterial structures in the abdomen and pelvis appear patent. Renal enhancement and contrast excretion within normal limits.  IMPRESSION: 1. New right upper lobe pneumonia with a new 3 cm central thick walled cavitary lesion containing a fluid level. Top differential considerations include pulmonary abscess and superinfected cavitary squamous cell lung metastasis. 2. Small focus of early infection in the right middle lobe. A small number of additional cavitary lung lesions remain indeterminate for scarring versus small mets. No pleural effusions. 3. Progressed mediastinal metastatic disease with evidence of invasion of the adjacent SVC and small nonocclusive tumor  versus bland thrombus. 4. New L5 lytic bone lesion most compatible with bone metastasis. 5. Percutaneous gastrostomy tube appears normally placed. Suggestion of mild stranding around the stomach, might reflect infectious gastritis. Salient findings discussed by telephone with PA TIFFANY  GREENE on 10/29/2014 at 23:56 .   Electronically Signed   By: Genevie Ann M.D.   On: 10/29/2014 23:59   Ct Abdomen Pelvis W Contrast  10/29/2014   CLINICAL DATA:  44 year old male with foul smelling leakage from peg tube. Current history of squamous cell oral cancer. Abnormal chest x-ray today. Subsequent encounter.  EXAM: CT CHEST, ABDOMEN, AND PELVIS WITH CONTRAST  TECHNIQUE: Multidetector CT imaging of the chest, abdomen and pelvis was performed following the standard protocol during bolus administration of intravenous contrast.  CONTRAST:  178mL OMNIPAQUE IOHEXOL 300 MG/ML  SOLN  COMPARISON:  [facility] PET-CT 09/03/2014  FINDINGS: CT CHEST FINDINGS  New large area of combined ground-glass opacity and consolidation in the right upper lobe. Chronic right apical cavitary lesion measuring 2.4 cm is stable (series 3, image 16), however, there is a new cavitary lesion with a fluid level at the epicenter of the consolidated lung measuring up to 33 mm diameter. This is thick walled.  There is a small area of solid and ground-glass nodular opacity along the posterior aspect of the right middle lobe which is new. The right lower lobe is stable.  The left upper lobe is improved with regressed peri-bronchovascular and perihilar patchy opacity. An anterior left upper lobe cavity on image 20 is slightly larger but remains thin walled. This currently is 15 mm diameter.  The left lung otherwise is stable.  No pleural or pericardial effusions.  Abnormal low-density mediastinal soft tissue has enlarged since April and now encompasses 25 x 39 mm (AP by transverse). With mass effect on the SVC which remains patent, although the SVC may be  invaded posteriorly with evidence of bland or tumor thrombus (series 2, image 35).  The thoracic aorta and proximal great vessels are patent. The central pulmonary arteries appear patent.  No axillary lymphadenopathy. No acute or suspicious osseous lesion in the chest.  CT ABDOMEN AND PELVIS FINDINGS  There is a new lytic lesion in the left L5 vertebra anteriorly (series 2, image 93). No other suspicious or acute osseous lesion identified.  Retained stool mixed with contrast in the colon. No dilated large or small bowel loops.  Motion artifact in the upper abdomen including at the level of the percutaneous gastrostomy tube. Mild soft tissue thickening along the course of the tube. The stomach is decompressed. There do does appear to be mild indistinct stranding around the lesser sac. No definite upper abdominal free fluid.  Liver, gallbladder,7 spleen, pancreas, and adrenal glands appear within normal limits allowing for motion. Major arterial structures in the abdomen and pelvis appear patent. Renal enhancement and contrast excretion within normal limits.  IMPRESSION: 1. New right upper lobe pneumonia with a new 3 cm central thick walled cavitary lesion containing a fluid level. Top differential considerations include pulmonary abscess and superinfected cavitary squamous cell lung metastasis. 2. Small focus of early infection in the right middle lobe. A small number of additional cavitary lung lesions remain indeterminate for scarring versus small mets. No pleural effusions. 3. Progressed mediastinal metastatic disease with evidence of invasion of the adjacent SVC and small nonocclusive tumor versus bland thrombus. 4. New L5 lytic bone lesion most compatible with bone metastasis. 5. Percutaneous gastrostomy tube appears normally placed. Suggestion of mild stranding around the stomach, might reflect infectious gastritis. Salient findings discussed by telephone with PA TIFFANY GREENE on 10/29/2014 at 23:56 .    Electronically Signed   By: Genevie Ann M.D.   On: 10/29/2014 23:59  Dg Abd Acute W/chest  10/29/2014   CLINICAL DATA:  Current history of tongue cancer.  EXAM: DG ABDOMEN ACUTE W/ 1V CHEST  COMPARISON:  None.  FINDINGS: Gastrostomy tube is noted in left upper quadrant. No abnormal bowel gas pattern is noted. Stool is noted in the left colon and rectum. Cardiomediastinal silhouette appears normal. Ill-defined airspace opacity is noted in right upper lobe with cavitary lesion with air-fluid level concerning for abscess. Another cavitary lesion is seen more superiorly in right upper lobe concerning for infection or malignancy. Ill-defined opacity is noted in left upper lobe concerning for early inflammation.  IMPRESSION: No evidence of bowel obstruction or ileus. Gastrostomy tube seen in the left upper quadrant of the abdomen.  Right upper lobe opacity is noted most consistent with pneumonia with cavitary lesion in air-fluid level concerning for abscess. Another cavitary lesion is seen more superiorly in the right upper lobe concerning for abscess or metastatic lesion. Ill-defined density is also seen in left upper lobe concerning for inflammation. CT scan of the chest is recommended for further evaluation.   Electronically Signed   By: Marijo Conception, M.D.   On: 10/29/2014 20:07   Assessment/Plan Principal Problem:   HCAP (healthcare-associated pneumonia) Active Problems:   Oral cancer   S/P percutaneous endoscopic gastrostomy (PEG) tube placement   Chronic pain syndrome   Tobacco use disorder   Sepsis   Metastasis   Cavitary lesion of lung   Pancytopenia due to chemotherapy   Superior vena cava thrombosis   1. HCAP (healthcare-associated pneumonia) The patient is presenting with complaints of cough with brownish expectoration. He has a CT scan which is showing evidence of multiple areas of consultation. With tachycardia, being on immunosuppressive chemotherapy patient will also be treated as  possible sepsis.  Thus he would be treated as healthcare associated pneumonia. I would give him IV fluid bolus. Broad-spectrum antibiotics vancomycin and Zosyn. Follow the cultures.  2. Cavitary lesions of the lung. The patient is also having multiple apical cavitary lesions of the lung. Based on earlier PET scan which showed similar findings consideration for atypical pneumonia as well as fungal pneumonia is also likely. The patient will be placed on airborne isolation, AFB smear as well as quantity from the record will be ordered. Patient will be empirically started on voriconazole. ID would be consulted in the morning.  3. Superior vena cava thrombosis. The patient is found to have a possible superior in the lower invasion by tumor with questionable thrombus. The patient at present has developed pancytopenia secondary to chemotherapy. With this the patient will be at high risk for bleeding is started on therapeutic anticoagulation. Need to discuss with oncology in the morning about further plan of care.  4. Hiccups. Possible irritation of chronic low secondary to tumor as well as possible pneumonia. We will use low-dose Thorazine as needed.  5. Chronic pain syndrome. Continuing home medications.  6. PEG tube dysfunction. CT scan is negative for any malposition of PEG tube  And shows Mild inflammatory gastritis. Use Protonix. Holding the use of PEG tube. IR consult in morning.  Advance goals of care discussion: Full code as per my discussion with patient  Patient's sister will be his power of attorney.  DVT Prophylaxis: SCD Nutrition: npo  Disposition: Admitted as inpatient, step-down unit at [facility] due to lack of ability of stepdown beds that [facility].  Author: Berle Mull, MD [facility] Pager: [phone] 10/30/2014  If 7PM-7AM, please contact night-coverage [email or web address]  Password [email or web address]

## 2014-10-30 NOTE — Progress Notes (Addendum)
Request to see G-tube that is leaking with concern for cellulitis. Patient states G-tube was placed in [place] in December.   Abd: Soft, G-tube with bumper extremely loose, slight erythema around site.  CT reviewed with Dr. Barbie Banner- G-tube in good position, no abscess seen  Recommendation: G-tube in good position and ok to use. Fasten and keep bumper tight to skin with only enough room for (1) gauze pad only under site, keep dressing intact over site, continue antibiotics.   Tsosie Billing PA-C Interventional Radiology  10/30/14  10:30 AM

## 2014-10-30 NOTE — Progress Notes (Signed)
ANTIBIOTIC CONSULT NOTE - INITIAL  Pharmacy Consult for Voriconazole Indication: suspected fungal pneumonia in pt with metastatic squamous cell cancer  Allergies  Allergen Reactions  . Other     Cat Gut Stitches - unknown reaction.Told by parents.   Vital Signs: Temp: 97.4 F (36.3 C) (06/17 1538) Temp Source: Oral (06/17 1538) BP: 82/39 mmHg (06/18 0115) Pulse Rate: 73 (06/18 0115)  Labs:  Recent Labs  10/29/14 1549 10/29/14 2216  WBC 2.6* 2.6*  HGB 7.5* 7.5*  PLT 175 164  CREATININE 0.62 0.61    Microbiology: No results found for this or any previous visit (from the past 720 hour(s)).  Medical History: Past Medical History  Diagnosis Date  . 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 Cancer 2015    tongue    Assessment: 44 year old male admitted with possibly infected G -tube. Started earlier on broad spectrum abx (Zosyn and Vanc) for sepsis. Now to add Voriconazole for possible fungal PNA in immunocompromised patient. SCr 0.61, est CrCl > 120 ml/min. CT shows PNA.  Goal of Therapy:  Resolution of infection  Plan:  Voriconazole 330mg  (6mg /kg) every 12 hours x 2 doses then Voriconzole 220 mg (4mg /kg) every 12 hours Will f/u renal function, micro data, and pt's clinical condition  Sherlon Handing, PharmD, BCPS Clinical pharmacist, pager [phone] 10/30/2014,2:35 AM

## 2014-10-30 NOTE — Progress Notes (Signed)
Admission note:  Arrival Method: Patient transferred from [facility] with staff accompanying. Mental Orientation:  Alert and oriented Telemetry: 6E-18, CCMD notified. Assessment: See doc flow sheets. Skin: Dry scabs noted on bilateral lower extremities, warm, dry and intact.  Assessed with Cathy. IV: Right hand and forearm peripheral IV present, patent, intact and infusing. Pain: C/o pain of mouth, 7/10, administered OXy IR. Safety Measures: Fall risk bandage, bed alarm, phone and call light within reach, bed in low position. Fall Prevention Safety Plan: Reviewed the plan, understood and acknowledged. Admission Screening: In Progress. 6700 Orientation: Patient has been oriented to the unit, staff and to the room.

## 2014-10-31 DIAGNOSIS — E440 Moderate protein-calorie malnutrition: Secondary | ICD-10-CM | POA: Insufficient documentation

## 2014-10-31 DIAGNOSIS — Z431 Encounter for attention to gastrostomy: Secondary | ICD-10-CM | POA: Insufficient documentation

## 2014-10-31 DIAGNOSIS — I82290 Acute embolism and thrombosis of other thoracic veins: Secondary | ICD-10-CM

## 2014-10-31 LAB — BASIC METABOLIC PANEL
Anion gap: 5 (ref 5–15)
BUN: 6 mg/dL (ref 6–20)
CO2: 32 mmol/L (ref 22–32)
CREATININE: 0.64 mg/dL (ref 0.61–1.24)
Calcium: 8 mg/dL — ABNORMAL LOW (ref 8.9–10.3)
Chloride: 99 mmol/L — ABNORMAL LOW (ref 101–111)
GFR calc Af Amer: >60 mL/min (ref >60)
Glucose, Bld: 101 mg/dL — ABNORMAL HIGH (ref 65–99)
POTASSIUM: 4 mmol/L (ref 3.5–5.1)
Sodium: 136 mmol/L (ref 135–145)

## 2014-10-31 LAB — CBC
HCT: 21.8 % — ABNORMAL LOW (ref 39.0–52.0)
Hemoglobin: 7.4 g/dL — ABNORMAL LOW (ref 13.0–17.0)
MCH: 29.6 pg (ref 26.0–34.0)
MCHC: 33.9 g/dL (ref 30.0–36.0)
MCV: 87.2 fL (ref 78.0–100.0)
Platelets: 169 10*3/uL (ref 150–400)
RBC: 2.5 MIL/uL — ABNORMAL LOW (ref 4.22–5.81)
RDW: 13.1 % (ref 11.5–15.5)
WBC: 2.9 10*3/uL — ABNORMAL LOW (ref 4.0–10.5)

## 2014-10-31 LAB — GLUCOSE, CAPILLARY
Glucose-Capillary: 114 mg/dL — ABNORMAL HIGH (ref 65–99)
Glucose-Capillary: 131 mg/dL — ABNORMAL HIGH (ref 65–99)
Glucose-Capillary: 144 mg/dL — ABNORMAL HIGH (ref 65–99)
Glucose-Capillary: 164 mg/dL — ABNORMAL HIGH (ref 65–99)
Glucose-Capillary: 85 mg/dL (ref 65–99)

## 2014-10-31 NOTE — Progress Notes (Signed)
[facility] Progress Note   Derek Campos JQB:[id number] DOB: [birth year] DOA: 10/29/2014 PCP: No PCP Per Patient  Brief narrative: Derek Campos is a 44 y.o. 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 He moved to [place] in March of this year in 2 weeks after the move he was found to have recurrence of his disease on the left side of his tongue with cervical lymphadenopathy and nodules in both lungs and is being treated currently with chemotherapy. He also has a PEG tube through which she receives feeds 4-5 times a day. He presented to the hospital for hiccups and leakage around his PEG tube. He admitted to having yellow-brown sputum for a couple of weeks. Chest x-ray revealed multiple pulmonary masses which appear to be abscesses. CT scan of the chest abdomen and pelvis was performed. He was found to have cavitary lesions with air-fluid levels in the chest and admitted for further treatment and workup.   Subjective: Cough and congestion are improving. No other complaints today  Assessment/Plan: Principal Problem:   HCAP (healthcare-associated pneumonia) -Cavitary lesions with air-fluid level in the right upper lobe lesion-  aspiration versus necrotic metastasis with infection -Continue vancomycin and Zosyn- -Asked pulmonary to evaluate him for need for bronch-the patient states in the past when he's had a transthoracic biopsy of the lung nodules, they were negative for cancer -A recent PET scan showed that some of these nodules didn't "light up" which may be secondary to cancer or infection -Up pneumonia negative -Aspergillus antigen, QuantiFERON Gold, Legionella pending  Active Problems: Hypotension -lactic acid is normal revealing that he is perfusing well  Hyponatremia -Started on IV fluids in the ER with the suspicion that he is dehydrated -Sodium improved to 136  today-continue to hydrate    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 noted on CT performed in ER -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 -due to the extent of the oral cancer, he may be aspirating. He did admit to Dr. Joya Gaskins that he sometimes coughs when he eats and drinks- -nothing by mouth per SLP-MBS pending    Chronic pain syndrome -cont chronic pain medications which include a fentanyl patch and oxycodone    Tobacco use disorder -Nicotine patch  Leukopenia/anemia -He is not neutropenic -Check anemia panel   Code Status: Full code DVT prophylaxis: Lovenox Consultants: Dr. Cliffton Asters Procedures:  Antibiotics: Anti-infectives    Start     Dose/Rate Route Frequency Ordered Stop   10/31/14 0300  voriconazole (VFEND) 220 mg in sodium chloride 0.9 % 100 mL IVPB  Status:  Discontinued     4 mg/kg  54.4 kg 61 mL/hr over 120 Minutes Intravenous Every 12 hours 10/30/14 0234 10/30/14 0812   10/30/14 0930  vancomycin (VANCOCIN) IVPB 750 mg/150 ml premix     750 mg 150 mL/hr over 60 Minutes Intravenous Every 12 hours 10/29/14 2127     10/30/14 0530  piperacillin-tazobactam (ZOSYN) IVPB 3.375  g     3.375 g 12.5 mL/hr over 240 Minutes Intravenous Every 8 hours 10/29/14 2127     10/30/14 0300  voriconazole (VFEND) 330 mg in sodium chloride 0.9 % 100 mL IVPB  Status:  Discontinued     6 mg/kg  54.4 kg 66.5 mL/hr over 120  Minutes Intravenous Every 12 hours 10/30/14 0234 10/30/14 0812   10/30/14 0200  metroNIDAZOLE (FLAGYL) IVPB 500 mg  Status:  Discontinued     500 mg 100 mL/hr over 60 Minutes Intravenous Every 8 hours 10/30/14 0149 10/30/14 0306   10/29/14 2100  vancomycin (VANCOCIN) IVPB 1000 mg/200 mL premix     1,000 mg 200 mL/hr over 60 Minutes Intravenous  Once 10/29/14 2046 10/30/14 0333   10/29/14 2100  piperacillin-tazobactam (ZOSYN) IVPB 3.375 g     3.375 g 12.5 mL/hr over 240 Minutes Intravenous  Once 10/29/14 2046 10/30/14 0135   10/29/14 2030  Ampicillin-Sulbactam (UNASYN) 3 g in sodium chloride 0.9 % 100 mL IVPB  Status:  Discontinued     3 g 100 mL/hr over 60 Minutes Intravenous  Once 10/29/14 2027 10/29/14 2046      Objective: Filed Weights   10/30/14 0200 10/30/14 1423  Weight: 54.4 kg (119 lb 14.9 oz) 59.058 kg (130 lb 3.2 oz)    Intake/Output Summary (Last 24 hours) at 10/31/14 1348 Last data filed at 10/31/14 0600  Gross per 24 hour  Intake   1274 ml  Output    600 ml  Net    674 ml     Vitals Filed Vitals:   10/30/14 1824 10/30/14 2100 10/31/14 0420 10/31/14 1000  BP: 102/62 98/58 94/60  98/48  Pulse: 71 85 74 82  Temp: 98.5 F (36.9 C) 98.6 F (37 C) 99 F (37.2 C) 98.3 F (36.8 C)  TempSrc: Oral Oral Oral Oral  Resp: 16 16 16 18   Height:      Weight:      SpO2: 100% 99% 97% 100%    Exam:  General:  Pt is alert, not in acute distress  HEENT: No icterus, No thrush, oral mucosa moist- necrotic tongue-missing anterior aspect of tongue  Cardiovascular: regular rate and rhythm, S1/S2 No murmur  Respiratory: clear to auscultation bilaterally   Abdomen: Soft, +Bowel sounds, non tender, non distended, no guarding  MSK: No LE edema, cyanosis or clubbing  Data Reviewed: Basic Metabolic Panel:  Recent Labs Lab 10/25/14 1018 10/29/14 1549 10/29/14 2216 10/30/14 0512 10/31/14 0600  NA 137 132* 129* 134* 136  K 4.1 3.7 3.8 3.8 4.0  CL  --  88* 89* 98* 99*   CO2 26 34* 32 28 32  GLUCOSE 192* 106* 119* 97 101*  BUN 15.3 12 11 7 6   CREATININE 0.7 0.62 0.61 0.60* 0.64  CALCIUM 9.3 8.5* 8.2* 7.8* 8.0*  MG 1.6  --   --   --   --    Liver Function Tests:  Recent Labs Lab 10/25/14 1018 10/29/14 1549 10/29/14 2216 10/30/14 0512  AST 16 23 20 17   ALT 12 17 16* 14*  ALKPHOS 74 54 53 52  BILITOT 0.20 0.3 0.4 0.4  PROT 7.0 6.5 5.8* 5.6*  ALBUMIN 2.9* 2.6* 2.4* 2.1*   No results for input(s): LIPASE, AMYLASE in the last 168 hours. No results for input(s): AMMONIA in the last 168 hours. CBC:  Recent Labs Lab 10/25/14 1012 10/29/14 1549 10/29/14 2216 10/30/14 0512 10/31/14 0600  WBC 3.2* 2.6* 2.6* 2.5* 2.9*  NEUTROABS 2.9  1.8 2.0 1.8  --   HGB 9.7* 7.5* 7.5* 7.5* 7.4*  HCT 29.4* 22.7* 22.5* 22.6* 21.8*  MCV 92 87.3 86.9 88.6 87.2  PLT 206 175 164 152 169   Cardiac Enzymes: No results for input(s): CKTOTAL, CKMB, CKMBINDEX, TROPONINI in the last 168 hours. BNP (last 3 results) No results for input(s): BNP in the last 8760 hours.  ProBNP (last 3 results) No results for input(s): PROBNP in the last 8760 hours.  CBG:  Recent Labs Lab 10/30/14 2158 10/31/14 0418 10/31/14 0817 10/31/14 1151  GLUCAP 118* 114* 85 131*    Recent Results (from the past 240 hour(s))  Culture, blood (routine x 2)     Status: None (Preliminary result)   Collection Time: 10/29/14  7:30 PM  Result Value Ref Range Status   Specimen Description BLOOD RIGHT ARM  Final   Special Requests BOTTLES DRAWN AEROBIC AND ANAEROBIC 5CC  Final   Culture NO GROWTH 2 DAYS  Final   Report Status PENDING  Incomplete  Culture, blood (routine x 2)     Status: None (Preliminary result)   Collection Time: 10/29/14  7:35 PM  Result Value Ref Range Status   Specimen Description BLOOD RIGHT FOREARM  Final   Special Requests BOTTLES DRAWN AEROBIC AND ANAEROBIC 5CC  Final   Culture NO GROWTH 2 DAYS  Final   Report Status PENDING  Incomplete  MRSA PCR Screening      Status: None   Collection Time: 10/30/14  7:30 AM  Result Value Ref Range Status   MRSA by PCR NEGATIVE NEGATIVE Final    Comment:        The GeneXpert MRSA Assay (FDA approved for NASAL specimens only), is one component of a comprehensive MRSA colonization surveillance program. It is not intended to diagnose MRSA infection nor to guide or monitor treatment for MRSA infections.      Studies: Ct Chest W Contrast  10/29/2014   CLINICAL DATA:  44 year old male with foul smelling leakage from peg tube. Current history of squamous cell oral cancer. Abnormal chest x-ray today. Subsequent encounter.  EXAM: CT CHEST, ABDOMEN, AND PELVIS WITH CONTRAST  TECHNIQUE: Multidetector CT imaging of the chest, abdomen and pelvis was performed following the standard protocol during bolus administration of intravenous contrast.  CONTRAST:  165mL OMNIPAQUE IOHEXOL 300 MG/ML  SOLN  COMPARISON:  [facility] PET-CT 09/03/2014  FINDINGS: CT CHEST FINDINGS  New large area of combined ground-glass opacity and consolidation in the right upper lobe. Chronic right apical cavitary lesion measuring 2.4 cm is stable (series 3, image 16), however, there is a new cavitary lesion with a fluid level at the epicenter of the consolidated lung measuring up to 33 mm diameter. This is thick walled.  There is a small area of solid and ground-glass nodular opacity along the posterior aspect of the right middle lobe which is new. The right lower lobe is stable.  The left upper lobe is improved with regressed peri-bronchovascular and perihilar patchy opacity. An anterior left upper lobe cavity on image 20 is slightly larger but remains thin walled. This currently is 15 mm diameter.  The left lung otherwise is stable.  No pleural or pericardial effusions.  Abnormal low-density mediastinal soft tissue has enlarged since April and now encompasses 25 x 39 mm (AP by transverse). With mass effect on the SVC which remains patent, although  the SVC may be invaded posteriorly with evidence of bland or tumor thrombus (series 2, image 35).  The thoracic aorta and proximal great vessels are patent. The central pulmonary arteries appear patent.  No axillary lymphadenopathy. No acute or suspicious osseous lesion in the chest.  CT ABDOMEN AND PELVIS FINDINGS  There is a new lytic lesion in the left L5 vertebra anteriorly (series 2, image 93). No other suspicious or acute osseous lesion identified.  Retained stool mixed with contrast in the colon. No dilated large or small bowel loops.  Motion artifact in the upper abdomen including at the level of the percutaneous gastrostomy tube. Mild soft tissue thickening along the course of the tube. The stomach is decompressed. There do does appear to be mild indistinct stranding around the lesser sac. No definite upper abdominal free fluid.  Liver, gallbladder,7 spleen, pancreas, and adrenal glands appear within normal limits allowing for motion. Major arterial structures in the abdomen and pelvis appear patent. Renal enhancement and contrast excretion within normal limits.  IMPRESSION: 1. New right upper lobe pneumonia with a new 3 cm central thick walled cavitary lesion containing a fluid level. Top differential considerations include pulmonary abscess and superinfected cavitary squamous cell lung metastasis. 2. Small focus of early infection in the right middle lobe. A small number of additional cavitary lung lesions remain indeterminate for scarring versus small mets. No pleural effusions. 3. Progressed mediastinal metastatic disease with evidence of invasion of the adjacent SVC and small nonocclusive tumor versus bland thrombus. 4. New L5 lytic bone lesion most compatible with bone metastasis. 5. Percutaneous gastrostomy tube appears normally placed. Suggestion of mild stranding around the stomach, might reflect infectious gastritis. Salient findings discussed by telephone with PA TIFFANY GREENE on 10/29/2014 at  23:56 .   Electronically Signed   By: Genevie Ann M.D.   On: 10/29/2014 23:59   Ct Abdomen Pelvis W Contrast  10/29/2014   CLINICAL DATA:  44 year old male with foul smelling leakage from peg tube. Current history of squamous cell oral cancer. Abnormal chest x-ray today. Subsequent encounter.  EXAM: CT CHEST, ABDOMEN, AND PELVIS WITH CONTRAST  TECHNIQUE: Multidetector CT imaging of the chest, abdomen and pelvis was performed following the standard protocol during bolus administration of intravenous contrast.  CONTRAST:  163mL OMNIPAQUE IOHEXOL 300 MG/ML  SOLN  COMPARISON:  [facility] PET-CT 09/03/2014  FINDINGS: CT CHEST FINDINGS  New large area of combined ground-glass opacity and consolidation in the right upper lobe. Chronic right apical cavitary lesion measuring 2.4 cm is stable (series 3, image 16), however, there is a new cavitary lesion with a fluid level at the epicenter of the consolidated lung measuring up to 33 mm diameter. This is thick walled.  There is a small area of solid and ground-glass nodular opacity along the posterior aspect of the right middle lobe which is new. The right lower lobe is stable.  The left upper lobe is improved with regressed peri-bronchovascular and perihilar patchy opacity. An anterior left upper lobe cavity on image 20 is slightly larger but remains thin walled. This currently is 15 mm diameter.  The left lung otherwise is stable.  No pleural or pericardial effusions.  Abnormal low-density mediastinal soft tissue has enlarged since April and now encompasses 25 x 39 mm (AP by transverse). With mass effect on the SVC which remains patent, although the SVC may be invaded posteriorly with evidence of bland or tumor thrombus (series 2, image 35).  The thoracic aorta and proximal great vessels are patent. The central pulmonary arteries appear patent.  No axillary lymphadenopathy. No acute or suspicious osseous  lesion in the chest.  CT ABDOMEN AND PELVIS FINDINGS  There is  a new lytic lesion in the left L5 vertebra anteriorly (series 2, image 93). No other suspicious or acute osseous lesion identified.  Retained stool mixed with contrast in the colon. No dilated large or small bowel loops.  Motion artifact in the upper abdomen including at the level of the percutaneous gastrostomy tube. Mild soft tissue thickening along the course of the tube. The stomach is decompressed. There do does appear to be mild indistinct stranding around the lesser sac. No definite upper abdominal free fluid.  Liver, gallbladder,7 spleen, pancreas, and adrenal glands appear within normal limits allowing for motion. Major arterial structures in the abdomen and pelvis appear patent. Renal enhancement and contrast excretion within normal limits.  IMPRESSION: 1. New right upper lobe pneumonia with a new 3 cm central thick walled cavitary lesion containing a fluid level. Top differential considerations include pulmonary abscess and superinfected cavitary squamous cell lung metastasis. 2. Small focus of early infection in the right middle lobe. A small number of additional cavitary lung lesions remain indeterminate for scarring versus small mets. No pleural effusions. 3. Progressed mediastinal metastatic disease with evidence of invasion of the adjacent SVC and small nonocclusive tumor versus bland thrombus. 4. New L5 lytic bone lesion most compatible with bone metastasis. 5. Percutaneous gastrostomy tube appears normally placed. Suggestion of mild stranding around the stomach, might reflect infectious gastritis. Salient findings discussed by telephone with PA TIFFANY GREENE on 10/29/2014 at 23:56 .   Electronically Signed   By: Genevie Ann M.D.   On: 10/29/2014 23:59   Dg Abd Acute W/chest  10/29/2014   CLINICAL DATA:  Current history of tongue cancer.  EXAM: DG ABDOMEN ACUTE W/ 1V CHEST  COMPARISON:  None.  FINDINGS: Gastrostomy tube is noted in left upper quadrant. No abnormal bowel gas pattern is noted. Stool is  noted in the left colon and rectum. Cardiomediastinal silhouette appears normal. Ill-defined airspace opacity is noted in right upper lobe with cavitary lesion with air-fluid level concerning for abscess. Another cavitary lesion is seen more superiorly in right upper lobe concerning for infection or malignancy. Ill-defined opacity is noted in left upper lobe concerning for early inflammation.  IMPRESSION: No evidence of bowel obstruction or ileus. Gastrostomy tube seen in the left upper quadrant of the abdomen.  Right upper lobe opacity is noted most consistent with pneumonia with cavitary lesion in air-fluid level concerning for abscess. Another cavitary lesion is seen more superiorly in the right upper lobe concerning for abscess or metastatic lesion. Ill-defined density is also seen in left upper lobe concerning for inflammation. CT scan of the chest is recommended for further evaluation.   Electronically Signed   By: Marijo Conception, M.D.   On: 10/29/2014 20:07    Scheduled Meds:  Scheduled Meds: . enoxaparin (LOVENOX) injection  40 mg Subcutaneous Q24H  . feeding supplement (JEVITY 1.5 CAL/FIBER)  474 mL Per Tube BID   And  . feeding supplement (JEVITY 1.5 CAL/FIBER)  711 mL Per Tube Q24H  . fentaNYL  150 mcg Transdermal Q72H  . gabapentin  600 mg Oral QID  . nicotine  14 mg Transdermal Daily  . nystatin  5 mL Oral QID  . piperacillin-tazobactam (ZOSYN)  IV  3.375 g Intravenous Q8H  . vancomycin  750 mg Intravenous Q12H   Continuous Infusions: . sodium chloride 100 mL/hr at 10/31/14 0600    Time spent on care of this patient:  16 min   [place], MD 10/31/2014, 1:48 PM  LOS: 1 day   [facility] Office  [phone] Pager - Text Page per [email or web address] If 7PM-7AM, please contact night-coverage [email or web address]

## 2014-10-31 NOTE — Progress Notes (Signed)
Patient found to be smoking E-Cig in room. Educated patient to not utilize E-Cig while in the hospital due to danger with hospital equipment and patient safety. Patient acknowledged and verbalizes understanding. Patient verbalizes he continues to have craving and is requesting for nicotine patch dosage to be increased. Notified T. Rogue Bussing. New orders received. Will continue to monitor.

## 2014-10-31 NOTE — Progress Notes (Signed)
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 admitted with leaking PEG tube and progressive cavitary lung nodules questionable etiology  SIGNIFICANT EVENTS    STUDIES:  CT scan of chest shows progressive cavitary nodules in both lungs with associated pneumonitis in the right lung  SUBJECTIVE:  Less cough  VITAL SIGNS: Temp:  [98.3 F (36.8 C)-99 F (37.2 C)] 98.3 F (36.8 C) (06/19 1000) Pulse Rate:  [63-85] 82 (06/19 1000) Resp:  [12-19] 18 (06/19 1000) BP: (88-102)/(48-62) 98/48 mmHg (06/19 1000) SpO2:  [97 %-100 %] 100 % (06/19 1000) Weight:  [59.058 kg (130 lb 3.2 oz)] 59.058 kg (130 lb 3.2 oz) (06/18 1423)  PHYSICAL EXAMINATION: General:  Thin white male in no acute distress Neuro:  Awake and alert no focal deficits HEENT:  Necrotic tongue seen with evidence of malignancy Cardiovascular:  Regular rate and rhythm normal S1-S2 no S3 or S4 Lungs:  Clear without rales or rhonchi Abdomen:  PEG tube in place soft nontender bowel sounds active,  erythema around PEG tube seen Musculoskeletal:  Full range of motion Skin:  Clear   Recent Labs Lab 10/29/14 2216 10/30/14 0512 10/31/14 0600  NA 129* 134* 136  K 3.8 3.8 4.0  CL 89* 98* 99*  CO2 32 28 32  BUN 11 7 6   CREATININE 0.61 0.60* 0.64  GLUCOSE 119* 97 101*    Recent Labs Lab 10/29/14 2216 10/30/14 0512 10/31/14 0600  HGB 7.5* 7.5* 7.4*  HCT 22.5* 22.6* 21.8*  WBC 2.6* 2.5* 2.9*  PLT 164 152 169   Ct Chest W Contrast  10/29/2014   CLINICAL DATA:  44 year old male with foul smelling leakage from peg tube. Current history of squamous cell oral cancer. Abnormal chest x-ray today. Subsequent encounter.  EXAM: CT CHEST, ABDOMEN, AND PELVIS WITH  CONTRAST  TECHNIQUE: Multidetector CT imaging of the chest, abdomen and pelvis was performed following the standard protocol during bolus administration of intravenous contrast.  CONTRAST:  128mL OMNIPAQUE IOHEXOL 300 MG/ML  SOLN  COMPARISON:  [facility] PET-CT 09/03/2014  FINDINGS: CT CHEST FINDINGS  New large area of combined ground-glass opacity and consolidation in the right upper lobe. Chronic right apical cavitary lesion measuring 2.4 cm is stable (series 3, image 16), however, there is a new cavitary lesion with a fluid level at the epicenter of the consolidated lung measuring up to 33 mm diameter. This is thick walled.  There is a small area of solid and ground-glass nodular opacity along the posterior aspect of the right middle lobe which is new. The right lower lobe is stable.  The left upper lobe is improved with regressed peri-bronchovascular and perihilar patchy opacity. An anterior left upper lobe cavity on image 20 is slightly larger but remains thin walled. This currently is 15 mm diameter.  The left lung otherwise is stable.  No pleural or pericardial effusions.  Abnormal low-density mediastinal soft tissue has enlarged since April and now encompasses 25 x 39 mm (AP by transverse). With mass effect on the SVC which remains patent, although the SVC may be invaded posteriorly with evidence of bland or tumor thrombus (series 2, image 35).  The thoracic aorta and proximal great vessels are patent. The central pulmonary arteries appear patent.  No  axillary lymphadenopathy. No acute or suspicious osseous lesion in the chest.  CT ABDOMEN AND PELVIS FINDINGS  There is a new lytic lesion in the left L5 vertebra anteriorly (series 2, image 93). No other suspicious or acute osseous lesion identified.  Retained stool mixed with contrast in the colon. No dilated large or small bowel loops.  Motion artifact in the upper abdomen including at the level of the percutaneous gastrostomy tube. Mild soft  tissue thickening along the course of the tube. The stomach is decompressed. There do does appear to be mild indistinct stranding around the lesser sac. No definite upper abdominal free fluid.  Liver, gallbladder,7 spleen, pancreas, and adrenal glands appear within normal limits allowing for motion. Major arterial structures in the abdomen and pelvis appear patent. Renal enhancement and contrast excretion within normal limits.  IMPRESSION: 1. New right upper lobe pneumonia with a new 3 cm central thick walled cavitary lesion containing a fluid level. Top differential considerations include pulmonary abscess and superinfected cavitary squamous cell lung metastasis. 2. Small focus of early infection in the right middle lobe. A small number of additional cavitary lung lesions remain indeterminate for scarring versus small mets. No pleural effusions. 3. Progressed mediastinal metastatic disease with evidence of invasion of the adjacent SVC and small nonocclusive tumor versus bland thrombus. 4. New L5 lytic bone lesion most compatible with bone metastasis. 5. Percutaneous gastrostomy tube appears normally placed. Suggestion of mild stranding around the stomach, might reflect infectious gastritis. Salient findings discussed by telephone with PA TIFFANY GREENE on 10/29/2014 at 23:56 .   Electronically Signed   By: Genevie Ann M.D.   On: 10/29/2014 23:59   Ct Abdomen Pelvis W Contrast  10/29/2014   CLINICAL DATA:  44 year old male with foul smelling leakage from peg tube. Current history of squamous cell oral cancer. Abnormal chest x-ray today. Subsequent encounter.  EXAM: CT CHEST, ABDOMEN, AND PELVIS WITH CONTRAST  TECHNIQUE: Multidetector CT imaging of the chest, abdomen and pelvis was performed following the standard protocol during bolus administration of intravenous contrast.  CONTRAST:  140mL OMNIPAQUE IOHEXOL 300 MG/ML  SOLN  COMPARISON:  [facility] PET-CT 09/03/2014  FINDINGS: CT CHEST FINDINGS  New large  area of combined ground-glass opacity and consolidation in the right upper lobe. Chronic right apical cavitary lesion measuring 2.4 cm is stable (series 3, image 16), however, there is a new cavitary lesion with a fluid level at the epicenter of the consolidated lung measuring up to 33 mm diameter. This is thick walled.  There is a small area of solid and ground-glass nodular opacity along the posterior aspect of the right middle lobe which is new. The right lower lobe is stable.  The left upper lobe is improved with regressed peri-bronchovascular and perihilar patchy opacity. An anterior left upper lobe cavity on image 20 is slightly larger but remains thin walled. This currently is 15 mm diameter.  The left lung otherwise is stable.  No pleural or pericardial effusions.  Abnormal low-density mediastinal soft tissue has enlarged since April and now encompasses 25 x 39 mm (AP by transverse). With mass effect on the SVC which remains patent, although the SVC may be invaded posteriorly with evidence of bland or tumor thrombus (series 2, image 35).  The thoracic aorta and proximal great vessels are patent. The central pulmonary arteries appear patent.  No axillary lymphadenopathy. No acute or suspicious osseous lesion in the chest.  CT ABDOMEN AND PELVIS FINDINGS  There is a new lytic  lesion in the left L5 vertebra anteriorly (series 2, image 93). No other suspicious or acute osseous lesion identified.  Retained stool mixed with contrast in the colon. No dilated large or small bowel loops.  Motion artifact in the upper abdomen including at the level of the percutaneous gastrostomy tube. Mild soft tissue thickening along the course of the tube. The stomach is decompressed. There do does appear to be mild indistinct stranding around the lesser sac. No definite upper abdominal free fluid.  Liver, gallbladder,7 spleen, pancreas, and adrenal glands appear within normal limits allowing for motion. Major arterial structures  in the abdomen and pelvis appear patent. Renal enhancement and contrast excretion within normal limits.  IMPRESSION: 1. New right upper lobe pneumonia with a new 3 cm central thick walled cavitary lesion containing a fluid level. Top differential considerations include pulmonary abscess and superinfected cavitary squamous cell lung metastasis. 2. Small focus of early infection in the right middle lobe. A small number of additional cavitary lung lesions remain indeterminate for scarring versus small mets. No pleural effusions. 3. Progressed mediastinal metastatic disease with evidence of invasion of the adjacent SVC and small nonocclusive tumor versus bland thrombus. 4. New L5 lytic bone lesion most compatible with bone metastasis. 5. Percutaneous gastrostomy tube appears normally placed. Suggestion of mild stranding around the stomach, might reflect infectious gastritis. Salient findings discussed by telephone with PA TIFFANY GREENE on 10/29/2014 at 23:56 .   Electronically Signed   By: Genevie Ann M.D.   On: 10/29/2014 23:59   Dg Abd Acute W/chest  10/29/2014   CLINICAL DATA:  Current history of tongue cancer.  EXAM: DG ABDOMEN ACUTE W/ 1V CHEST  COMPARISON:  None.  FINDINGS: Gastrostomy tube is noted in left upper quadrant. No abnormal bowel gas pattern is noted. Stool is noted in the left colon and rectum. Cardiomediastinal silhouette appears normal. Ill-defined airspace opacity is noted in right upper lobe with cavitary lesion with air-fluid level concerning for abscess. Another cavitary lesion is seen more superiorly in right upper lobe concerning for infection or malignancy. Ill-defined opacity is noted in left upper lobe concerning for early inflammation.  IMPRESSION: No evidence of bowel obstruction or ileus. Gastrostomy tube seen in the left upper quadrant of the abdomen.  Right upper lobe opacity is noted most consistent with pneumonia with cavitary lesion in air-fluid level concerning for abscess. Another  cavitary lesion is seen more superiorly in the right upper lobe concerning for abscess or metastatic lesion. Ill-defined density is also seen in left upper lobe concerning for inflammation. CT scan of the chest is recommended for further evaluation.   Electronically Signed   By: Marijo Conception, M.D.   On: 10/29/2014 20:07   NOTE CT CHEST FROM 09/2014 at [facility]: CT Chest W Contrast - Final result (09/16/2014 2:51 PM) CT Chest W Contrast - Final result (09/16/2014 2:51 PM)  Impressions    1. Similar appearance of multiple irregular and spiculated pulmonary nodules, majority of which are cavitary, consistent with metastatic disease.   2. Suspected endobronchial metastases within the lingula as described above and mild lingular postobstructive pneumonia/pneumonitis.  3. Near resolution of previously visualized left upper lobe airspace consolidation consistent with inflammation/infection with multiple residual ground glass opacities persisting, likely representing residual infection however, additional sites of metastatic disease are not excluded. Continued followup is anticipated.  4. Enlarging necrotic lower right paratracheal/precarinal nodal mass with significant compression and likely invasion of the superior vena cava.  5. Mild bilateral pleural nodularity  and thickening, possibly postinflammatory or reactive though metastatic involvement cannot be excluded. Continued followup anticipated.     ASSESSMENT / PLAN:  #1 progression in bilateral lung nodules with cavitary lesions seen in right upper lobe nodule with air-fluid level. There is also not mass like density seen in the mediastinum and lytic lesion in L5 vertebra. These findings are consistent with metastatic squamous cell carcinoma tongue however there is surrounding pneumonitis in the right upper lobe and the patient has had difficulty with swallowing and I suspect some of the process is from abscess formation with chronic aspiration  pneumonitis.   Note outside CT scan has been obtained from [facility] in May 2016 and did suggest endobronchial metastases within the left lingula with postobstructive pneumonitis a similar process may well be occurring in the right lobe as well. There is also enlarging necrotic right lower paratracheal height precarinal nodal mass with compression and invasion of the superior vena cava. There is pleural nodularity and thickening compatible with metastatic involvement as well. Most of the pulmonary nodules are irregular and spiculated and are consistent with metastatic disease.   Plan   The pt needs a bronchoscopy for further evaluation, i will make the pt NPO after    midnight tonight   Continue Zosyn and vancomycin for now   D/c resp isolation     Glyn Ade  [phone]  Cell  [phone]  If no response or cell goes to voicemail, call beeper [phone]  Pulmonary and [place] Pager: [phone]  10/31/2014, 11:09 AM

## 2014-11-01 ENCOUNTER — Ambulatory Visit: Payer: No Typology Code available for payment source

## 2014-11-01 ENCOUNTER — Encounter (HOSPITAL_COMMUNITY)
Admission: EM | Disposition: A | Discharge status: Home / Self Care | Payer: Self-pay | Source: Home / Self Care | Attending: Internal Medicine

## 2014-11-01 ENCOUNTER — Other Ambulatory Visit: Payer: No Typology Code available for payment source

## 2014-11-01 ENCOUNTER — Inpatient Hospital Stay (HOSPITAL_COMMUNITY): Payer: Medicaid Other

## 2014-11-01 ENCOUNTER — Ambulatory Visit: Payer: Self-pay

## 2014-11-01 ENCOUNTER — Other Ambulatory Visit: Payer: Self-pay

## 2014-11-01 HISTORY — PX: VIDEO BRONCHOSCOPY: SHX5072

## 2014-11-01 LAB — LEGIONELLA ANTIGEN, URINE

## 2014-11-01 LAB — GLUCOSE, CAPILLARY
Glucose-Capillary: 80 mg/dL (ref 65–99)
Glucose-Capillary: 98 mg/dL (ref 65–99)

## 2014-11-01 LAB — BASIC METABOLIC PANEL
Anion gap: 8 (ref 5–15)
CHLORIDE: 98 mmol/L — AB (ref 101–111)
CO2: 30 mmol/L (ref 22–32)
Calcium: 8.4 mg/dL — ABNORMAL LOW (ref 8.9–10.3)
Creatinine, Ser: 0.6 mg/dL — ABNORMAL LOW (ref 0.61–1.24)
GFR calc Af Amer: >60 mL/min (ref >60)
Glucose, Bld: 92 mg/dL (ref 65–99)
Potassium: 3.8 mmol/L (ref 3.5–5.1)
Sodium: 136 mmol/L (ref 135–145)

## 2014-11-01 LAB — GRAM STAIN

## 2014-11-01 SURGERY — BRONCHOSCOPY, WITH FLUOROSCOPY
Anesthesia: Moderate Sedation | Laterality: Bilateral

## 2014-11-01 MED ORDER — FENTANYL CITRATE (PF) 100 MCG/2ML IJ SOLN
25.0000 ug | INTRAMUSCULAR | Status: DC | PRN
Start: 2014-11-01 — End: 2014-11-03
  Administered 2014-11-01 (×6): 25 ug via INTRAVENOUS

## 2014-11-01 MED ORDER — MIDAZOLAM HCL 5 MG/ML IJ SOLN
INTRAMUSCULAR | Status: AC
  Filled 2014-11-01: qty 2

## 2014-11-01 MED ORDER — MIDAZOLAM HCL 10 MG/2ML IJ SOLN
INTRAMUSCULAR | Status: DC | PRN
  Administered 2014-11-01 (×5): 1 mg via INTRAVENOUS

## 2014-11-01 MED ORDER — FENTANYL CITRATE (PF) 100 MCG/2ML IJ SOLN
INTRAMUSCULAR | Status: AC
  Filled 2014-11-01: qty 4

## 2014-11-01 MED ORDER — SODIUM CHLORIDE 0.9 % IV SOLN: INTRAVENOUS | Status: DC

## 2014-11-01 MED ORDER — PHENYLEPHRINE HCL 0.25 % NA SOLN
NASAL | Status: DC | PRN
  Administered 2014-11-01: 2 via NASAL

## 2014-11-01 MED ORDER — NICOTINE 21 MG/24HR TD PT24
21.0000 mg | MEDICATED_PATCH | Freq: Every day | TRANSDERMAL | Status: DC
  Administered 2014-11-01: 21 mg via TRANSDERMAL
  Filled 2014-11-01 (×2): qty 1

## 2014-11-01 MED ORDER — LIDOCAINE HCL (PF) 1 % IJ SOLN
INTRAMUSCULAR | Status: DC | PRN
  Administered 2014-11-01: 6 mL

## 2014-11-01 MED ORDER — LIDOCAINE HCL 2 % EX GEL
CUTANEOUS | Status: DC | PRN
  Administered 2014-11-01: 1

## 2014-11-01 NOTE — Progress Notes (Signed)
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 admitted with leaking PEG tube and progressive cavitary lung nodules questionable etiology  SIGNIFICANT EVENTS    STUDIES:  CT scan of chest shows progressive cavitary nodules in both lungs with associated pneumonitis in the right lung  SUBJECTIVE:  Denies chest pain or dyspnea Cough persists, dry  VITAL SIGNS: Temp:  [98 F (36.7 C)-98.2 F (36.8 C)] 98.2 F (36.8 C) (06/20 1050) Pulse Rate:  [58-78] 58 (06/20 1105) Resp:  [9-22] 14 (06/20 1105) BP: (91-108)/(51-68) 95/68 mmHg (06/20 1100) SpO2:  [94 %-99 %] 96 % (06/20 1105) Weight:  [58.06 kg (128 lb)-58.106 kg (128 lb 1.6 oz)] 58.06 kg (128 lb) (06/20 1050)  PHYSICAL EXAMINATION: General:  Thin white male in no acute distress Neuro:  Awake and alert no focal deficits HEENT:  Necrotic tongue seen with evidence of malignancy Cardiovascular:  Regular rate and rhythm normal S1-S2 no S3 or S4 Lungs:  Clear without rales or rhonchi Abdomen:  PEG tube in place soft nontender bowel sounds active,  erythema around PEG tube seen Musculoskeletal:  Full range of motion Skin:  Clear   Recent Labs Lab 10/30/14 0512 10/31/14 0600 11/01/14 0540  NA 134* 136 136  K 3.8 4.0 3.8  CL 98* 99* 98*  CO2 28 32 30  BUN 7 6 <5*  CREATININE 0.60* 0.64 0.60*  GLUCOSE 97 101* 92    Recent Labs Lab 10/29/14 2216 10/30/14 0512 10/31/14 0600  HGB 7.5* 7.5* 7.4*  HCT 22.5* 22.6* 21.8*  WBC 2.6* 2.5* 2.9*  PLT 164 152 169   Dg C-arm Bronchoscopy  11/01/2014   CLINICAL DATA:    C-ARM BRONCHOSCOPY  Fluoroscopy was utilized by the requesting physician.  No radiographic  interpretation.    NOTE CT CHEST FROM 09/2014 at  [facility]: CT Chest W Contrast - Final result (09/16/2014 2:51 PM) CT Chest W Contrast - Final result (09/16/2014 2:51 PM)  Impressions    1. Similar appearance of multiple irregular and spiculated pulmonary nodules, majority of which are cavitary, consistent with metastatic disease.   2. Suspected endobronchial metastases within the lingula as described above and mild lingular postobstructive pneumonia/pneumonitis.  3. Near resolution of previously visualized left upper lobe airspace consolidation consistent with inflammation/infection with multiple residual ground glass opacities persisting, likely representing residual infection however, additional sites of metastatic disease are not excluded. Continued followup is anticipated.  4. Enlarging necrotic lower right paratracheal/precarinal nodal mass with significant compression and likely invasion of the superior vena cava.  5. Mild bilateral pleural nodularity and thickening, possibly postinflammatory or reactive though metastatic involvement cannot be excluded. Continued followup anticipated.     ASSESSMENT / PLAN:  #1 progression in bilateral lung nodules with cavitary lesions seen in right upper lobe nodule with air-fluid level. There is also not mass like density seen in the mediastinum and lytic lesion in L5 vertebra. These findings are consistent with metastatic squamous cell carcinoma tongue however there is surrounding pneumonitis in the right upper lobe and the patient has had difficulty with swallowing and I suspect some of the process is from abscess formation with chronic aspiration pneumonitis. The new right upper lobe cavity-was not seen on PET scan 08/2014 Note outside CT scan has been  obtained from [facility] in May 2016 and did suggest endobronchial metastases within the left lingula with postobstructive pneumonitis a similar process may well be occurring in the right lobe as well. There is also enlarging necrotic right  lower paratracheal /precarinal nodal mass with compression and invasion of the superior vena cava. There is pleural nodularity and thickening compatible with metastatic involvement as well. Most of the pulmonary nodules are irregular and spiculated and are consistent with metastatic disease.   Plan   Bronchoscopy today -discussed risks and benefits in detail   Continue Zosyn and vancomycin for now   D/c resp isolation     ALVA,RAKESH V.MD Kara Mead MD. [facility]. [place] [facility] Pager [phone] If no response call [phone]    11/01/2014, 12:00 PM

## 2014-11-01 NOTE — Progress Notes (Signed)
SLP Cancellation Note  Patient Details Name: Derek Campos MRN: [id number] DOB: 1970/06/16   Cancelled treatment:       Reason Eval/Treat Not Completed: Patient at procedure or test/unavailable. Per notes pt to have bronchoscopy today. Plan for MBS tomorrow.    Evonne Rinks, Katherene Ponto 11/01/2014, 7:54 AM

## 2014-11-01 NOTE — Plan of Care (Signed)
Problem: Consults Goal: Nutrition Consult-if indicated Outcome: Completed/Met Date Met:  11/01/14 Dietitian following for tube feedings.

## 2014-11-01 NOTE — Progress Notes (Signed)
Video bronchoscopy with intervention washing, intervention brushing, intervention biopsy, intervention transbronchial biospy. Called report to charge nurse Steele Berg as to procedure done. All vitals good thru out procedure.

## 2014-11-01 NOTE — Progress Notes (Signed)
[facility] Progress Note   Derek Campos OAC:[id number] DOB: 1970-10-08 DOA: 10/29/2014 PCP: No PCP Per Patient  Brief narrative: Derek Campos is a 44 y.o. 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 He moved to [place] in March of this year in 2 weeks after the move he was found to have recurrence of his disease on the left side of his tongue with cervical lymphadenopathy and nodules in both lungs and is being treated currently with chemotherapy. He also has a PEG tube through which she receives feeds 4-5 times a day. He presented to the hospital for hiccups and leakage around his PEG tube. He admitted to having yellow-brown sputum for a couple of weeks. Chest x-ray revealed multiple pulmonary masses which appear to be abscesses. CT scan of the chest abdomen and pelvis was performed. He was found to have cavitary lesions with air-fluid levels in the chest and admitted for further treatment and workup.   Subjective: Sleepy after bronchoscopy- poorly communicative   Assessment/Plan: Principal Problem:   HCAP (healthcare-associated pneumonia) -Cavitary lesions with air-fluid level in the right upper lobe lesion-  aspiration versus necrotic metastasis with infection -Continue vancomycin and Zosyn- -underwent bronch and biopsies today - no endobronchial lesions -A recent PET scan showed that some of these nodules didn't "light up" which may be secondary to cancer or infection -strep pneumonia and legionella negative -Aspergillus antigen, QuantiFERON Gold, Legionella pending - MBS pending to evaluate for aspiration  Active Problems: Hypotension -lactic acid is normal revealing that he is perfusing well  Hyponatremia -Started on IV fluids in the ER with the suspicion that he is dehydrated -Sodium improved to 136 - tolerating tube feeds - stop IVF    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 noted on CT performed in ER -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 -due to the extent of the oral cancer, he may be aspirating. He did admit to Dr. Joya Gaskins that he sometimes coughs when he eats and drinks- -nothing by mouth per SLP-MBS pending    Chronic pain syndrome -cont chronic pain medications which include a fentanyl patch and oxycodone    Tobacco use disorder -Nicotine patch  Leukopenia/anemia -He is not neutropenic -Check anemia panel   Code Status: Full code DVT prophylaxis: Lovenox Consultants: Dr. Cliffton Asters Procedures:  Antibiotics: Anti-infectives    Start     Dose/Rate Route Frequency Ordered Stop   10/31/14 0300  voriconazole (VFEND) 220 mg in sodium chloride 0.9 % 100 mL IVPB  Status:  Discontinued     4 mg/kg  54.4 kg 61 mL/hr over 120 Minutes Intravenous Every 12 hours 10/30/14 0234 10/30/14 0812   10/30/14 0930  vancomycin (VANCOCIN) IVPB 750 mg/150 ml premix     750 mg 150 mL/hr over 60 Minutes Intravenous Every 12 hours 10/29/14 2127     10/30/14 0530  piperacillin-tazobactam (ZOSYN) IVPB 3.375 g     3.375 g 12.5 mL/hr over  240 Minutes Intravenous Every 8 hours 10/29/14 2127     10/30/14 0300  voriconazole (VFEND) 330 mg in sodium chloride 0.9 % 100 mL IVPB  Status:  Discontinued     6 mg/kg  54.4 kg 66.5 mL/hr over 120 Minutes Intravenous Every 12 hours 10/30/14 0234 10/30/14  0812   10/30/14 0200  metroNIDAZOLE (FLAGYL) IVPB 500 mg  Status:  Discontinued     500 mg 100 mL/hr over 60 Minutes Intravenous Every 8 hours 10/30/14 0149 10/30/14 0306   10/29/14 2100  vancomycin (VANCOCIN) IVPB 1000 mg/200 mL premix     1,000 mg 200 mL/hr over 60 Minutes Intravenous  Once 10/29/14 2046 10/30/14 0333   10/29/14 2100  piperacillin-tazobactam (ZOSYN) IVPB 3.375 g     3.375 g 12.5 mL/hr over 240 Minutes Intravenous  Once 10/29/14 2046 10/30/14 0135   10/29/14 2030  Ampicillin-Sulbactam (UNASYN) 3 g in sodium chloride 0.9 % 100 mL IVPB  Status:  Discontinued     3 g 100 mL/hr over 60 Minutes Intravenous  Once 10/29/14 2027 10/29/14 2046      Objective: Filed Weights   10/30/14 1423 10/31/14 2037 11/01/14 1050  Weight: 59.058 kg (130 lb 3.2 oz) 58.106 kg (128 lb 1.6 oz) 58.06 kg (128 lb)    Intake/Output Summary (Last 24 hours) at 11/01/14 1702 Last data filed at 11/01/14 1547  Gross per 24 hour  Intake   4628 ml  Output      0 ml  Net   4628 ml     Vitals Filed Vitals:   11/01/14 1230 11/01/14 1235 11/01/14 1240 11/01/14 1319  BP: 100/65 98/69  99/74  Pulse: 63 64 61 55  Temp:    97.5 F (36.4 C)  TempSrc:    Oral  Resp: 16 12 12 14   Height:      Weight:      SpO2: 91% 100% 100% 100%    Exam:  General:  Pt is alert, not in acute distress  HEENT: No icterus, No thrush, oral mucosa moist- necrotic tongue-missing anterior aspect of tongue  Cardiovascular: regular rate and rhythm, S1/S2 No murmur  Respiratory: clear to auscultation bilaterally   Abdomen: Soft, +Bowel sounds, non tender, non distended, no guarding  MSK: No LE edema, cyanosis or clubbing  Data Reviewed: Basic Metabolic Panel:  Recent Labs Lab 10/29/14 1549 10/29/14 2216 10/30/14 0512 10/31/14 0600 11/01/14 0540  NA 132* 129* 134* 136 136  K 3.7 3.8 3.8 4.0 3.8  CL 88* 89* 98* 99* 98*  CO2 34* 32 28 32 30  GLUCOSE 106* 119* 97 101* 92  BUN 12 11 7 6  <5*  CREATININE  0.62 0.61 0.60* 0.64 0.60*  CALCIUM 8.5* 8.2* 7.8* 8.0* 8.4*   Liver Function Tests:  Recent Labs Lab 10/29/14 1549 10/29/14 2216 10/30/14 0512  AST 23 20 17   ALT 17 16* 14*  ALKPHOS 54 53 52  BILITOT 0.3 0.4 0.4  PROT 6.5 5.8* 5.6*  ALBUMIN 2.6* 2.4* 2.1*   No results for input(s): LIPASE, AMYLASE in the last 168 hours. No results for input(s): AMMONIA in the last 168 hours. CBC:  Recent Labs Lab 10/29/14 1549 10/29/14 2216 10/30/14 0512 10/31/14 0600  WBC 2.6* 2.6* 2.5* 2.9*  NEUTROABS 1.8 2.0 1.8  --   HGB 7.5* 7.5* 7.5* 7.4*  HCT 22.7* 22.5* 22.6* 21.8*  MCV 87.3 86.9 88.6 87.2  PLT 175 164 152 169   Cardiac Enzymes: No results for input(s): CKTOTAL, CKMB, CKMBINDEX,  TROPONINI in the last 168 hours. BNP (last 3 results) No results for input(s): BNP in the last 8760 hours.  ProBNP (last 3 results) No results for input(s): PROBNP in the last 8760 hours.  CBG:  Recent Labs Lab 10/31/14 1151 10/31/14 1621 10/31/14 2344 11/01/14 0405 11/01/14 0756  GLUCAP 131* 144* 164* 80 98    Recent Results (from the past 240 hour(s))  Culture, blood (routine x 2)     Status: None (Preliminary result)   Collection Time: 10/29/14  7:30 PM  Result Value Ref Range Status   Specimen Description BLOOD RIGHT ARM  Final   Special Requests BOTTLES DRAWN AEROBIC AND ANAEROBIC 5CC  Final   Culture NO GROWTH 3 DAYS  Final   Report Status PENDING  Incomplete  Culture, blood (routine x 2)     Status: None (Preliminary result)   Collection Time: 10/29/14  7:35 PM  Result Value Ref Range Status   Specimen Description BLOOD RIGHT FOREARM  Final   Special Requests BOTTLES DRAWN AEROBIC AND ANAEROBIC 5CC  Final   Culture NO GROWTH 3 DAYS  Final   Report Status PENDING  Incomplete  MRSA PCR Screening     Status: None   Collection Time: 10/30/14  7:30 AM  Result Value Ref Range Status   MRSA by PCR NEGATIVE NEGATIVE Final    Comment:        The GeneXpert MRSA Assay  (FDA approved for NASAL specimens only), is one component of a comprehensive MRSA colonization surveillance program. It is not intended to diagnose MRSA infection nor to guide or monitor treatment for MRSA infections.   Gram stain     Status: None   Collection Time: 11/01/14 11:45 AM  Result Value Ref Range Status   Specimen Description BRONCHIAL ALVEOLAR LAVAGE  Final   Special Requests NONE  Final   Gram Stain   Final    MODERATE WBC PRESENT,BOTH PMN AND MONONUCLEAR NO ORGANISMS SEEN    Report Status 11/01/2014 FINAL  Final     Studies: Dg Chest Port 1 View  11/01/2014   CLINICAL DATA:  Post bronchoscopy and biopsy  EXAM: PORTABLE CHEST - 1 VIEW  COMPARISON:  CT chest 10/29/2014  FINDINGS: Cardiomediastinal silhouette is stable. Persistent infiltrate/pneumonia in right upper lobe. Again noted thick wall cavitary lesion in right upper lobe measures 2.8 cm stable in size from prior exam. Left lung is clear. No pulmonary edema. There is no pneumothorax.  IMPRESSION: Persistent infiltrate/pneumonia in right upper lobe. Stable cavitary lesion in right upper lobe. No pneumothorax.   Electronically Signed   By: Lahoma Crocker M.D.   On: 11/01/2014 12:35   Dg C-arm Bronchoscopy  11/01/2014   CLINICAL DATA:    C-ARM BRONCHOSCOPY  Fluoroscopy was utilized by the requesting physician.  No radiographic  interpretation.     Scheduled Meds:  Scheduled Meds: . enoxaparin (LOVENOX) injection  40 mg Subcutaneous Q24H  . feeding supplement (JEVITY 1.5 CAL/FIBER)  474 mL Per Tube BID   And  . feeding supplement (JEVITY 1.5 CAL/FIBER)  711 mL Per Tube Q24H  . fentaNYL  150 mcg Transdermal Q72H  . nicotine  21 mg Transdermal Daily  . nystatin  5 mL Oral QID  . piperacillin-tazobactam (ZOSYN)  IV  3.375 g Intravenous Q8H  . vancomycin  750 mg Intravenous Q12H   Continuous Infusions: . sodium chloride 100 mL/hr at 11/01/14 1311  . sodium chloride      Time spent on care of  this patient: 6  min   [place], MD 11/01/2014, 5:02 PM  LOS: 2 days   [facility] Office  [phone] Pager - Text Page per [email or web address] If 7PM-7AM, please contact night-coverage [email or web address]

## 2014-11-01 NOTE — Op Note (Signed)
Indication: RUL cavitary consolidation and mediastinal lymphadenopathy in the 44 -year-old With metastatic head and neck cancer  Written informed consent was obtained from the patient prior to the procedure. The risks of the procedure including coughing, bleeding and a small chance of lung cancer requiring a chest tube were discussed with the patient in great detail and evidenced understanding.  5 mg of Versed and  150 mcg of fentanyl were used in divided doses during the procedure. Bronchoscope was inserted from the right Nare. The upper airway showed complete collapse of the epiglottis post sedation with closure of upper airway .  false vocal cords appeared swollen . True vocal cord showed normal appearance in motion. The trachea bronchial tree was then examined to the subsegmental level. Minimal  secretions were noted. No endobronchial lesions were noted.  Attention was then turned to the rightuppere.brushings and Transbronchial biopsies x3 were obtained from theposteriors of the right upper  lobe.  Bronchoalveolar lavage was obtained from the rightupper  lobe with good return.  The patient tolerated procedure well with minimal bleeding.  Transbronchial needle aspiration was performed at station 7-subcarinal lymph node  A portable chest XR. will be performed to rule out presence of pneumothorax. He was awake and alert in the end of the procedure.  Kara Mead MD. Shade Flood. [facility] Pager [phone] If no response call [phone]

## 2014-11-02 ENCOUNTER — Inpatient Hospital Stay (HOSPITAL_COMMUNITY): Payer: Medicaid Other

## 2014-11-02 ENCOUNTER — Encounter (HOSPITAL_COMMUNITY): Payer: Self-pay | Admitting: Pulmonary Disease

## 2014-11-02 LAB — BASIC METABOLIC PANEL
Anion gap: 7 (ref 5–15)
BUN: 7 mg/dL (ref 6–20)
CO2: 30 mmol/L (ref 22–32)
Calcium: 9.1 mg/dL (ref 8.9–10.3)
Chloride: 99 mmol/L — ABNORMAL LOW (ref 101–111)
Creatinine, Ser: 0.52 mg/dL — ABNORMAL LOW (ref 0.61–1.24)
GFR calc Af Amer: >60 mL/min (ref >60)
GFR calc non Af Amer: >60 mL/min (ref >60)
GLUCOSE: 98 mg/dL (ref 65–99)
POTASSIUM: 4.2 mmol/L (ref 3.5–5.1)
Sodium: 136 mmol/L (ref 135–145)

## 2014-11-02 LAB — RETICULOCYTES
RBC.: 3.03 MIL/uL — AB (ref 4.22–5.81)
RETIC CT PCT: 1.1 % (ref 0.4–3.1)
Retic Count, Absolute: 33.3 10*3/uL (ref 19.0–186.0)

## 2014-11-02 LAB — FERRITIN: FERRITIN: 315 ng/mL (ref 24–336)

## 2014-11-02 LAB — IRON AND TIBC
IRON: 60 ug/dL (ref 45–182)
Saturation Ratios: 22 % (ref 17.9–39.5)
TIBC: 267 ug/dL (ref 250–450)
UIBC: 207 ug/dL

## 2014-11-02 LAB — VITAMIN B12: Vitamin B-12: 1430 pg/mL — ABNORMAL HIGH (ref 180–914)

## 2014-11-02 LAB — FOLATE: FOLATE: 27.1 ng/mL (ref >5.9)

## 2014-11-02 MED ORDER — RESOURCE THICKENUP CLEAR PO POWD
ORAL | Status: DC | PRN
  Filled 2014-11-02: qty 125

## 2014-11-02 MED ORDER — JEVITY 1.5 CAL/FIBER PO LIQD
711.0000 mL | Freq: Every day | ORAL | Status: DC
  Administered 2014-11-02: 474 mL
  Administered 2014-11-03: 711 mL
  Filled 2014-11-02 (×3): qty 1000

## 2014-11-02 MED ORDER — JEVITY 1.5 CAL/FIBER PO LIQD
474.0000 mL | Freq: Two times a day (BID) | ORAL | Status: DC
  Administered 2014-11-02 – 2014-11-03 (×2): 474 mL
  Filled 2014-11-02 (×4): qty 1000

## 2014-11-02 NOTE — Progress Notes (Signed)
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 Denies fevers, chills, night sweats, vision changes.  He had increased secretions and  Productive brown colored cough. Denies any chest pain or palpitations. Denies lower extremity swelling. Denies nausea, heartburn but had increased hiccups. Denies abdominal pain. Appetite is normal. Denies any dysuria. Denies abnormal skin rashes, or neuropathy. Denies any bleeding issues such as epistaxis, hematemesis, hematuria or hematochezia. Ambulating without difficulty.   CT chest, abdomen and pelvis on 6/17 showed New right upper lobe pneumonia with a new 3 cm central thick walled cavitary lesion containing a fluid level suspicious for pulmonary abscess and superinfected cavitary squamous cell lung metastasis. Small focus of early infection in the right middle lobe was noted. A small number of additional cavitary lung lesions, indeterminate for scarring versus small mets were seen. No pleural effusions.  Progressed mediastinal metastatic disease with evidence of invasion of the adjacent SVC and small nonocclusive tumor versus bland thrombus, and new L5 lytic bone lesion most compatible with bone metastasis were observed.  Percutaneous gastrostomy tube appears normally placed. Suggestion of mild stranding around the stomach suspecting infectious gastritis was visualized.  He was placed on IV antibiotics, supportive therapy, and on Lovenox.  He had a bronchoscopy on 6/20 with results pending.  IR evaluated the PEG tube, requiring fastening and frequent dressing changes- G tube is functioning correctly. He is receiving nutrition via tube without difficulty. Symptoms are now improving.  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  There was evidence of local disease initially however the patient has had evidence for progression with mediastinal mass present that is enlarged and bilateral lung nodules which are hot on PET scan. The patient does state he had a needle biopsy in [place] which did not reveal cancer in the lung. He initially completed treatment with low-dose Cisplatin with radiation in [place] in January 2016.                          Current Therapy:  Carboplatin/Taxotere/Erbitux s/p cycle 2 due to disease recurrence,( first cycle in 09/2014) S/p D1C2 10/18/14    MEDICATIONS: Scheduled Meds: . enoxaparin (LOVENOX) injection  40 mg Subcutaneous Q24H  . fentaNYL  150 mcg Transdermal Q72H  . nystatin  5 mL Oral QID  . piperacillin-tazobactam (ZOSYN)  IV  3.375 g Intravenous Q8H  . vancomycin  750 mg Intravenous Q12H   Continuous Infusions: . sodium chloride Stopped (11/01/14 1100)   PRN Meds:.chlorproMAZINE (THORAZINE) IV, fentaNYL (SUBLIMAZE) injection, lidocaine, oxycodone   ALLERGIES:   Allergies  Allergen Reactions  . Other     Cat Gut Stitches - unknown reaction.Told by parents.     PHYSICAL EXAMINATION:  Filed Vitals:   11/02/14 0950  BP: 91/58  Pulse: 77  Temp: 98.3 F (36.8 C)  Resp: 16   Filed Weights   10/31/14 2037 11/01/14 1050 11/01/14 2147  Weight: 128 lb 1.6 oz (58.106 kg) 128 lb (58.06 kg) 108 lb 1.6 oz (49.034 kg)    GENERAL:alert, no distress and comfortable. Temporal and muscle wasting noted.  SKIN: skin color, texture, turgor are pale, no rashes or significant lesions EYES: normal, conjunctiva are pink and non-injected, sclera clear OROPHARYNX:no exudate, no erythema and lips,tongue with areas of thrush, anterior portion ressected. NECK: supple, thyroid  normal size, non-tender, without nodularity LYMPH:  no palpable lymphadenopathy in the cervical, axillary or inguinal LUNGS: clear to auscultation and percussion with normal breathing effort HEART: regular rate & rhythm and no murmurs and no lower extremity edema ABDOMEN:abdomen soft, non-tender and normal bowel sounds. G tube area non tender Musculoskeletal:no cyanosis of digits and no clubbing  PSYCH: alert & oriented x 3 with fluent speech NEURO: no focal motor/sensory deficits   LABORATORY/RADIOLOGY DATA:   Recent Labs Lab 10/29/14 1549 10/29/14 2216 10/30/14 0512 10/31/14 0600  WBC 2.6* 2.6* 2.5* 2.9*  HGB 7.5* 7.5* 7.5* 7.4*  HCT 22.7* 22.5* 22.6* 21.8*  PLT 175 164 152 169  MCV 87.3 86.9 88.6 87.2  MCH 28.8 29.0 29.4 29.6  MCHC 33.0 33.3 33.2 33.9  RDW 12.9 12.9 13.0 13.1  LYMPHSABS 0.2* 0.3* 0.2*  --   MONOABS 0.6 0.4 0.5  --   EOSABS 0.0 0.0 0.0  --   BASOSABS 0.0 0.0 0.0  --     CMP    Recent Labs Lab 10/29/14 1549 10/29/14 2216 10/30/14 0512 10/31/14 0600 11/01/14 0540 11/02/14 0625  NA 132* 129* 134* 136 136 136  K 3.7 3.8 3.8 4.0 3.8 4.2  CL 88* 89* 98* 99* 98* 99*  CO2 34* 32 28 32 30 30  GLUCOSE 106* 119* 97 101* 92 98  BUN 12 11 7 6  <5* 7  CREATININE 0.62 0.61 0.60* 0.64 0.60* 0.52*  CALCIUM 8.5* 8.2* 7.8* 8.0* 8.4* 9.1  AST 23 20 17   --   --   --   ALT 17 16* 14*  --   --   --   ALKPHOS 54 53 52  --   --   --   BILITOT 0.3 0.4 0.4  --   --   --         Component Value Date/Time   BILITOT 0.4 10/30/2014 0512   BILITOT 0.20 10/25/2014 1018   BILITOT 0.50 10/18/2014 1029    Anemia panel:    Recent Labs  11/02/14 0625  VITAMINB12 1430*  FOLATE 27.1  FERRITIN 315  TIBC 267  IRON 60  RETICCTPCT 1.1      Recent Labs Lab 10/29/14 2216 10/30/14 0512  INR 1.22 1.26      Urinalysis    Component Value Date/Time   COLORURINE STRAW* 10/30/2014 0155   APPEARANCEUR CLEAR 10/30/2014 0155   LABSPEC 1.003* 10/30/2014 0155    PHURINE 7.5 10/30/2014 0155   GLUCOSEU NEGATIVE 10/30/2014 0155   HGBUR NEGATIVE 10/30/2014 0155   BILIRUBINUR NEGATIVE 10/30/2014 0155   KETONESUR NEGATIVE 10/30/2014 0155   PROTEINUR NEGATIVE 10/30/2014 0155   UROBILINOGEN 0.2 10/30/2014 0155   NITRITE NEGATIVE 10/30/2014 0155   LEUKOCYTESUR NEGATIVE 10/30/2014 0155    Liver Function Tests:  Recent Labs Lab 10/29/14 1549 10/29/14 2216 10/30/14 0512  AST 23 20 17   ALT 17 16* 14*  ALKPHOS 54 53 52  BILITOT 0.3 0.4 0.4  PROT 6.5 5.8* 5.6*  ALBUMIN 2.6* 2.4* 2.1*   CBG:  Recent Labs Lab 10/31/14 1151 10/31/14 1621 10/31/14 2344 11/01/14 0405 11/01/14 0756  GLUCAP 131* 144* 164* 80 98   Hgb A1c No results for input(s): HGBA1C in the last 72 hours.   Radiology Studies:  Ct  Chest W Contrast  10/29/2014   COMPARISON:  [facility] PET-CT 09/03/2014  FINDINGS: CT CHEST FINDINGS  New large area of combined ground-glass opacity and consolidation in the right upper lobe. Chronic right apical cavitary lesion measuring 2.4 cm is stable (series 3, image 16), however, there is a new cavitary lesion with a fluid level at the epicenter of the consolidated lung measuring up to 33 mm diameter. This is thick walled.  There is a small area of solid and ground-glass nodular opacity along the posterior aspect of the right middle lobe which is new. The right lower lobe is stable.  The left upper lobe is improved with regressed peri-bronchovascular and perihilar patchy opacity. An anterior left upper lobe cavity on image 20 is slightly larger but remains thin walled. This currently is 15 mm diameter.  The left lung otherwise is stable.  No pleural or pericardial effusions.  Abnormal low-density mediastinal soft tissue has enlarged since April and now encompasses 25 x 39 mm (AP by transverse). With mass effect on the SVC which remains patent, although the SVC may be invaded posteriorly with evidence of bland or tumor thrombus (series 2,  image 35).  The thoracic aorta and proximal great vessels are patent. The central pulmonary arteries appear patent.  No axillary lymphadenopathy. No acute or suspicious osseous lesion in the chest.   IMPRESSION: 1. New right upper lobe pneumonia with a new 3 cm central thick walled cavitary lesion containing a fluid level. Top differential considerations include pulmonary abscess and superinfected cavitary squamous cell lung metastasis. 2. Small focus of early infection in the right middle lobe. A small number of additional cavitary lung lesions remain indeterminate for scarring versus small mets. No pleural effusions. 3. Progressed mediastinal metastatic disease with evidence of invasion of the adjacent SVC and small nonocclusive tumor versus bland thrombus. 4. New L5 lytic bone lesion most compatible with bone metastasis. 5. Percutaneous gastrostomy tube appears normally placed. Suggestion of mild stranding around the stomach, might reflect infectious gastritis. Salient findings discussed by telephone with PA TIFFANY GREENE on 10/29/2014 at 23:56 .   Electronically Signed   By: Genevie Ann M.D.   On: 10/29/2014 23:59   Ct Abdomen Pelvis W Contrast  10/29/2014  COMPARISON:  [facility] PET-CT 09/03/2014  FINDINGS: CT ABDOMEN AND PELVIS FINDINGS  There is a new lytic lesion in the left L5 vertebra anteriorly (series 2, image 93). No other suspicious or acute osseous lesion identified.  Retained stool mixed with contrast in the colon. No dilated large or small bowel loops.  Motion artifact in the upper abdomen including at the level of the percutaneous gastrostomy tube. Mild soft tissue thickening along the course of the tube. The stomach is decompressed. There do does appear to be mild indistinct stranding around the lesser sac. No definite upper abdominal free fluid.  Liver, gallbladder,7 spleen, pancreas, and adrenal glands appear within normal limits allowing for motion. Major arterial structures in the  abdomen and pelvis appear patent. Renal enhancement and contrast excretion within normal limits.  IMPRESSION: 1. New right upper lobe pneumonia with a new 3 cm central thick walled cavitary lesion containing a fluid level. Top differential considerations include pulmonary abscess and superinfected cavitary squamous cell lung metastasis. 2. Small focus of early infection in the right middle lobe. A small number of additional cavitary lung lesions remain indeterminate for scarring versus small mets. No pleural effusions. 3. Progressed mediastinal metastatic disease with evidence of invasion of the adjacent  SVC and small nonocclusive tumor versus bland thrombus. 4. New L5 lytic bone lesion most compatible with bone metastasis. 5. Percutaneous gastrostomy tube appears normally placed. Suggestion of mild stranding around the stomach, might reflect infectious gastritis. Salient findings discussed by telephone with PA TIFFANY GREENE on 10/29/2014 at 23:56 .   Electronically Signed   By: Genevie Ann M.D.   On: 10/29/2014 23:59   Dg Chest Port 1 View  11/01/2014   CLINICAL DATA:  Post bronchoscopy and biopsy  EXAM: PORTABLE CHEST - 1 VIEW  COMPARISON:  CT chest 10/29/2014  FINDINGS: Cardiomediastinal silhouette is stable. Persistent infiltrate/pneumonia in right upper lobe. Again noted thick wall cavitary lesion in right upper lobe measures 2.8 cm stable in size from prior exam. Left lung is clear. No pulmonary edema. There is no pneumothorax.  IMPRESSION: Persistent infiltrate/pneumonia in right upper lobe. Stable cavitary lesion in right upper lobe. No pneumothorax.   Electronically Signed   By: Lahoma Crocker M.D.   On: 11/01/2014 12:35   Dg Abd Acute W/chest  10/29/2014   CLINICAL DATA:  Current history of tongue cancer.  EXAM: DG ABDOMEN ACUTE W/ 1V CHEST  COMPARISON:  None.  FINDINGS: Gastrostomy tube is noted in left upper quadrant. No abnormal bowel gas pattern is noted. Stool is noted in the left colon and rectum.  Cardiomediastinal silhouette appears normal. Ill-defined airspace opacity is noted in right upper lobe with cavitary lesion with air-fluid level concerning for abscess. Another cavitary lesion is seen more superiorly in right upper lobe concerning for infection or malignancy. Ill-defined opacity is noted in left upper lobe concerning for early inflammation.  IMPRESSION: No evidence of bowel obstruction or ileus. Gastrostomy tube seen in the left upper quadrant of the abdomen.  Right upper lobe opacity is noted most consistent with pneumonia with cavitary lesion in air-fluid level concerning for abscess. Another cavitary lesion is seen more superiorly in the right upper lobe concerning for abscess or metastatic lesion. Ill-defined density is also seen in left upper lobe concerning for inflammation. CT scan of the chest is recommended for further evaluation.   Electronically Signed   By: Marijo Conception, M.D.   On: 10/29/2014 20:07   Dg C-arm Bronchoscopy  11/01/2014   CLINICAL DATA:    C-ARM BRONCHOSCOPY  Fluoroscopy was utilized by the requesting physician.  No radiographic  interpretation.      PET SCAN 09/03/14 CLINICAL DATA: FINDINGS: NECK  Extensive hyper metabolism in the tongue both centrally, and antral laterally on the left side (SUVmax = 8.8), compatible with the reported primary squamous cell carcinoma. Borderline enlarged left level 2 lymph nodes measuring up to 8 mm in short axis are hypermetabolic (SUVmax = 3.6).  CHEST  Numerous nodular densities seen throughout the lungs bilaterally, many of which are hypermetabolic. The largest and mosthypermetabolic of these is in the perihilar aspect of the left upper lobe (image 36 of series 8) measuring 2.6 cm in diameter (SUVmax = 7.8). Most of these are rather ill-defined and predominantly ground-glass attenuation. Many these have central areas of cavitation, and some are frankly cavitary with thick walls, best demonstrated by a 2.4 cm  thick-walled cavity in the apex of the right upper lobe (image 17 of series 8), with surrounding ground-glass attenuation. There is a general upper lung predominance, of these lesions which is unusual for metastatic disease, raising the possibility of multifocal atypical infection, including fungal pneumonia. Mild diffuse bronchial wall thickening with mild centrilobular and paraseptal emphysema, most notable  in the lung apices. No pleural effusions. Centrally low-attenuation 15 mm short axis hypermetabolic (SUVmax = 7.8) low right paratracheal lymph node. In addition, there is some hypermetabolism in the left hilar region, presumably lymphadenopathy (SUVmax = 11.3).  ABDOMEN/PELVIS  No abnormal hypermetabolic activity within the liver, pancreas, adrenal glands, or spleen. No hypermetabolic lymph nodes in the abdomen or pelvis. Percutaneous gastrostomy tube in position with retention balloon in the body of the stomach. No significant volume of ascites. No pneumoperitoneum. No pathologic distention of small bowel or colon.  SKELETON  No focal hypermetabolic activity to suggest skeletal metastasis.  IMPRESSION: 1. Large amount of hypermetabolism in the tongue, compatible with recurrent squamous cell carcinoma. There is associated left level 2 cervical lymphadenopathy, as well as multiple ill-defined nodular areas scattered throughout the lungs bilaterally, and low right paratracheal lymphadenopathy, all of which are hypermetabolic. Given the upper lung predominance of the findings, the possibility of an atypical infectious process is not excluded, including fungal etiologies. However, given the widespread nodularity in the lungs, with cavitation, the possibility of squamous cell metastases accounting for at least some of these nodules is highly likely.  2. Additional incidental findings, as above.   Electronically Signed  By: Vinnie Langton M.D.  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  Had bronchoscopy on 6/20 with results pending. Will await for diagnosis May need Rad Onc evaluation   HCAP: Likely due to metastatic disease, aspiration pneumonitis  CT chest new right upper lobe pneumonia with a new 3 cm central thick walled cavitary lesion containing a fluid level suspicious for pulmonary abscess and superinfected cavitary squamous cell lung metastasis. Small focus of early infection in the right middle lobe was noted. A small number of additional cavitary lung lesions, indeterminate for scarring versus small mets were seen. No pleural effusions.   He was placed on IV antibiotics with cultures pending Continue supportive therapy  DVT prophylaxis CT chest  6/17 showed progressed mediastinal metastatic disease with evidence of invasion of the adjacent SVC and small nonocclusive tumor versus bland thrombus. 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  He actually looks quite good. He really is not complaining of any respiratory symptoms. He says  he's been trying to take in nutrition by mouth. I'm sure this is why he is aspirating.  The biopsies and brushings and washings have all come back negative for obvious malignancy.  I am not sure as to what to make of this mediastinal lymphadenopathy with possible invasion of the SVC. I would think that an MRI would be helpful in this situation. I would  definitely recommend this. If there is an issue, then radiation therapy might be reasonable to try.  He has had 2 cycles of chemotherapy. I really believe that the chemotherapy is working. He feels that it is working. He is not having nearly as much pain. His mouth is not as painful.  His hemoglobin is quite low. Again, I would probably have to recommend a transfusion for him. I think we are going to continue him on treatment, then a transfusion will help out.  He is had no obvious fever. He's had no hemoptysis.  There's not been any issues with bowels or bladder.  I am grateful for the outstanding care that he is receiving up on 6 E. he is very complementary of the care that he is getting.  We will continue to follow along and help out in any way possible.  Lum Keas  Proverbs 21:21

## 2014-11-02 NOTE — Progress Notes (Signed)
MBSS complete. Full report located under chart review in imaging section. Naba Sneed, MA CCC-SLP [phone]  

## 2014-11-02 NOTE — Progress Notes (Signed)
ANTIBIOTIC CONSULT NOTE - FOLLOW UP  Pharmacy Consult for vancomycin/zosyn Indication: infected G tube and r/o HCAP  Allergies  Allergen Reactions  . Other     Cat Gut Stitches - unknown reaction.Told by parents.    Patient Measurements: Height: 5\' 10"  (177.8 cm) Weight: 108 lb 1.6 oz (49.034 kg) IBW/kg (Calculated) : 73  Vital Signs: Temp: 98.3 F (36.8 C) (06/21 0950) Temp Source: Oral (06/21 0950) BP: 91/58 mmHg (06/21 0950) Pulse Rate: 77 (06/21 0950) Intake/Output from previous day: 06/20 0701 - 06/21 0700 In: 2729.7 [I.V.:1091.7; IV Piggyback:450] Out: -  Intake/Output from this shift: Total I/O In: 1568 [I.V.:30; Other:390; NG/GT:948; IV Piggyback:200] Out: -   Labs:  Recent Labs  10/31/14 0600 11/01/14 0540 11/02/14 0625  WBC 2.9*  --   --   HGB 7.4*  --   --   PLT 169  --   --   CREATININE 0.64 0.60* 0.52*   Estimated Creatinine Clearance: 80.8 mL/min (by C-G formula based on Cr of 0.52). No results for input(s): VANCOTROUGH, VANCOPEAK, VANCORANDOM, GENTTROUGH, GENTPEAK, GENTRANDOM, TOBRATROUGH, TOBRAPEAK, TOBRARND, AMIKACINPEAK, AMIKACINTROU, AMIKACIN in the last 72 hours.   Assessment: 76 YOM who is G tube dependent d/t history of tongue cancer, he is on vanc/zosyn for infected G tube and r/o HCAP. WBC 2.9 low, PCT neg, LA 0.6 > 1.2, afeb. Bronchoscopy 6/20 w/ biopsy, continue abx for now. Sent BAL/fungal/AFB, cultures. per pulm, if cultures remain negative, transition to Augmentin + Clinda x 2 wks and then repeat CXR. SCr 0.52, stable CrCl ~80, lytes ok  Vancomycin 6/17> Zosyn 6/17> Voriconazole 6/18>6/18 rec 1 dose  6/17 Bld x 2 >> ngtd 6/18 aspergillus atigen -  6/18 quantiferon tb gold -  6/20 BAL - ngtd 6/20 fungal - ngtd 6/20 AFB- ngtd  Goal of Therapy:  Vancomycin trough level 15-20 mcg/ml  Plan:  - Continue vanc 750mg  IV q12h - Continue Zosyn 3.375g IV q8h (4 hr infusion) - F/U Abx LOT & cultures - Check vancomycin trough if  continues for > 3 more days   Maryanna Shape, PharmD, BCPS  Clinical Pharmacist  Pager: [phone]   11/02/2014,2:21 PM

## 2014-11-02 NOTE — Progress Notes (Signed)
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 admitted with leaking PEG tube and progressive cavitary lung nodules questionable etiology  SIGNIFICANT EVENTS  6/20 bronch  STUDIES:  CT scan of chest 6/17 shows progressive cavitary nodules in both lungs with associated pneumonitis in the right lung  SUBJECTIVE:  Denies chest pain or dyspnea No hemoptysis  VITAL SIGNS: Temp:  [97.5 F (36.4 C)-98.2 F (36.8 C)] 98.1 F (36.7 C) (06/21 0420) Pulse Rate:  [55-112] 79 (06/21 0420) Resp:  [7-25] 18 (06/21 0420) BP: (89-108)/(50-79) 90/51 mmHg (06/21 0420) SpO2:  [91 %-100 %] 97 % (06/21 0420) Weight:  [108 lb 1.6 oz (49.034 kg)-128 lb (58.06 kg)] 108 lb 1.6 oz (49.034 kg) (06/20 2147)  PHYSICAL EXAMINATION: General:  Thin white male in no acute distress Neuro:  Awake and alert no focal deficits HEENT:  Necrotic tongue seen with evidence of malignancy Cardiovascular:  Regular rate and rhythm normal S1-S2 no S3 or S4 Lungs:  Clear without rales or rhonchi Abdomen:  PEG tube in place soft nontender bowel sounds active,  erythema around PEG tube seen Musculoskeletal:  Full range of motion Skin:  Clear   Recent Labs Lab 10/31/14 0600 11/01/14 0540 11/02/14 0625  NA 136 136 136  K 4.0 3.8 4.2  CL 99* 98* 99*  CO2 32 30 30  BUN 6 <5* 7  CREATININE 0.64 0.60* 0.52*  GLUCOSE 101* 92 98    Recent Labs Lab 10/29/14 2216 10/30/14 0512 10/31/14 0600  HGB 7.5* 7.5* 7.4*  HCT 22.5* 22.6* 21.8*  WBC 2.6* 2.5* 2.9*  PLT 164 152 169   Dg Chest Port 1 View  11/01/2014   CLINICAL DATA:  Post bronchoscopy and biopsy  EXAM: PORTABLE CHEST - 1 VIEW  COMPARISON:  CT chest 10/29/2014  FINDINGS: Cardiomediastinal silhouette  is stable. Persistent infiltrate/pneumonia in right upper lobe. Again noted thick wall cavitary lesion in right upper lobe measures 2.8 cm stable in size from prior exam. Left lung is clear. No pulmonary edema. There is no pneumothorax.  IMPRESSION: Persistent infiltrate/pneumonia in right upper lobe. Stable cavitary lesion in right upper lobe. No pneumothorax.   Electronically Signed   By: Lahoma Crocker M.D.   On: 11/01/2014 12:35   Dg C-arm Bronchoscopy  11/01/2014   CLINICAL DATA:    C-ARM BRONCHOSCOPY  Fluoroscopy was utilized by the requesting physician.  No radiographic  interpretation.    NOTE CT CHEST FROM 09/2014 at [facility]: CT Chest W Contrast - Final result (09/16/2014 2:51 PM) CT Chest W Contrast - Final result (09/16/2014 2:51 PM)  Impressions    1. Similar appearance of multiple irregular and spiculated pulmonary nodules, majority of which are cavitary, consistent with metastatic disease.   2. Suspected endobronchial metastases within the lingula as described above and mild lingular postobstructive pneumonia/pneumonitis.  3. Near resolution of previously visualized left upper lobe airspace consolidation consistent with inflammation/infection with multiple residual ground glass opacities persisting, likely representing residual infection however, additional sites of metastatic disease are not excluded. Continued followup is anticipated.  4. Enlarging necrotic lower right paratracheal/precarinal nodal mass with significant compression and likely invasion of the superior vena cava.  5. Mild bilateral pleural nodularity and thickening, possibly postinflammatory or reactive though metastatic involvement cannot  be excluded. Continued followup anticipated.     ASSESSMENT / PLAN:  #1 progression in bilateral lung nodules with cavitary lesions seen in right upper lobe nodule with air-fluid level. There is also not mass like density seen in the mediastinum and lytic lesion in L5 vertebra.  These findings are consistent with metastatic squamous cell carcinoma tongue however there is surrounding pneumonitis in the right upper lobe and the patient has had difficulty with swallowing and I suspect some of the process is from abscess formation with chronic aspiration pneumonitis. The new right upper lobe cavity-was not seen on PET scan 08/2014 Note outside CT scan has been obtained from [facility] in May 2016 and did suggest endobronchial metastases within the left lingula with postobstructive pneumonitis a similar process may well be occurring in the right lobe as well. There is also enlarging necrotic right lower paratracheal /precarinal nodal mass with compression and invasion of the superior vena cava. There is pleural nodularity and thickening compatible with metastatic involvement as well. Most of the pulmonary nodules are irregular and spiculated and are consistent with metastatic disease.   Plan   Continue Zosyn and vancomycin for now - if cx remain neg, suggest augmentin + clinda x 2 weeks total -then reimage with CXR                         Will need Dr Marin Olp input - cavitary lesion likely infectious -since was not there in 08/2014 , mediastinal mass - unclear, bone lesions likely malignant        [facility] V.MD Kara Mead MD. [facility]. [place] [facility] Pager [phone] If no response call [phone]    11/02/2014, 9:49 AM

## 2014-11-02 NOTE — Progress Notes (Signed)
11/02/2014 11:09 AM  This is a late entry.  Pt refused bed alarm during assessment this am, despite high fall risk and education on necessary interventions given fall risk.  Encouraged patient to please call if he needs anything, especially to get up and move around, to which he verbalized understanding.  Will continue to monitor patient. Princella Pellegrini

## 2014-11-02 NOTE — Progress Notes (Addendum)
[facility] Progress Note   Derek Campos HYQ:[id number] DOB: June 01, 1970 DOA: 10/29/2014 PCP: No PCP Per Patient  Brief narrative: Derek Campos is a 44 y.o. 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 He moved to [place] in March of this year in 2 weeks after the move he was found to have recurrence of his disease on the left side of his tongue with cervical lymphadenopathy and nodules in both lungs and is being treated currently with chemotherapy. He also has a PEG tube through which she receives feeds 4-5 times a day. He received his second cycle of chemo on Monday.   He presented to the hospital for hiccups and leakage around his PEG tube. He admitted to having yellow-brown sputum for a couple of weeks. Chest x-ray revealed multiple pulmonary masses which appear to be abscesses. CT scan of the chest abdomen and pelvis was performed. He was found to have cavitary lesions with air-fluid levels in the chest and admitted for further treatment and workup.   Subjective: C/o pain in mouth- just took and Oxycodone which usually helps- tube feeds infusing without leakage- stool is loose but not frequent- just once a day. He has been ambulation. No dyspnea - cough improving. No other complaints.   Assessment/Plan: Principal Problem:   HCAP (healthcare-associated pneumonia) -Cavitary lesions with air-fluid level in the right upper lobe lesion-  aspiration versus necrotic metastasis with infection -Continue vancomycin and Zosyn- -underwent bronch and biopsies  - no endobronchial lesions -A recent PET scan showed that some of these nodules didn't "light up" which may be secondary to cancer or infection -strep pneumonia and legionella negative -Aspergillus antigen, QuantiFERON Gold pending - gram stain on BAL neg for bacteria  - MBS pending to evaluate for aspiration - per pulm, if  cultures remain negative, transition to Augmentin + Clinda x 2 wks and then repeat CXR  Active Problems: Aspiration issues? -due to the extent of the oral cancer, he may be aspirating. He did admit to Dr. Joya Gaskins that he sometimes coughs when he eats and drinks-above mentioned abscess may be a result of aspiration -nothing by mouth per SLP-MBS pending  Leukopenia/anemia -He is not neutropenic -Anemia panel reveal normal Iron, B12 and folate levels- anemia and leukopenia likely secondary to chemo- not symptomatic - will not transfuse - repeat CBC tomorrow   Hypotension -despite hypotension, lactic acid is normal revealing that he is perfusing well  Hyponatremia -Started on IV fluids in the ER with the suspicion that he was dehydrated -Sodium improved to 136 - tolerating tube feeds - stopped IVF- sodium has remained stable    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 noted on CT performed in ER -Progressive mediastinal metastatic disease with evidence of invasion of the adjacent SVC and small nonocclusive tumor versus bland thrombus-we'll need to continue to monitor him over the next few weeks/months for SVC syndrome-   consider radiation for mediastinal metastasis -called Dr Marin Olp to notify him of patient today- he will come see him    S/P percutaneous endoscopic gastrostomy (PEG) tube placement -According to the patient this was leaking at home-he thought it was infected- asked IR to assess the tube -according to their assessment, it appeared to be working well-tube feeds started-no leakage noted  Moderate malnutrition - Tube feed regimen recommended: 7 cans Jevity 1.5 via PEG. Split into 3 feeding of 2-3-2.-Flush w/ 120 mls fluid before  and after each feed    Chronic pain syndrome due to cancer -cont chronic pain medications which include a fentanyl patch and oxycodone    Tobacco use disorder -smoking 2-3 cigarettes per day-was placed on a high dose  Nicotine patch based on patient request when he was found to be smoking an e cigarette in the room on 6/19- counseled by staff - does not need a 21 mg patch unless he is smoking 1PPD which he does not admit to- d/c Nicotine patch   Code Status: Full code DVT prophylaxis: Lovenox Consultants: Dr. Cliffton Asters Procedures: Bronch  Antibiotics: Anti-infectives    Start     Dose/Rate Route Frequency Ordered Stop   10/31/14 0300  voriconazole (VFEND) 220 mg in sodium chloride 0.9 % 100 mL IVPB  Status:  Discontinued     4 mg/kg  54.4 kg 61 mL/hr over 120 Minutes Intravenous Every 12 hours 10/30/14 0234 10/30/14 0812   10/30/14 0930  vancomycin (VANCOCIN) IVPB 750 mg/150 ml premix     750 mg 150 mL/hr over 60 Minutes Intravenous Every 12 hours 10/29/14 2127     10/30/14 0530  piperacillin-tazobactam (ZOSYN) IVPB 3.375 g     3.375 g 12.5 mL/hr over 240 Minutes Intravenous Every 8 hours 10/29/14 2127     10/30/14 0300  voriconazole (VFEND) 330 mg in sodium chloride 0.9 % 100 mL IVPB  Status:  Discontinued     6 mg/kg  54.4 kg 66.5 mL/hr over 120 Minutes Intravenous Every 12 hours 10/30/14 0234 10/30/14 0812   10/30/14 0200  metroNIDAZOLE (FLAGYL) IVPB 500 mg  Status:  Discontinued     500 mg 100 mL/hr over 60 Minutes Intravenous Every 8 hours 10/30/14 0149 10/30/14 0306   10/29/14 2100  vancomycin (VANCOCIN) IVPB 1000 mg/200 mL premix     1,000 mg 200 mL/hr over 60 Minutes Intravenous  Once 10/29/14 2046 10/30/14 0333   10/29/14 2100  piperacillin-tazobactam (ZOSYN) IVPB 3.375 g     3.375 g 12.5 mL/hr over 240 Minutes Intravenous  Once 10/29/14 2046 10/30/14 0135   10/29/14 2030  Ampicillin-Sulbactam (UNASYN) 3 g in sodium chloride 0.9 % 100 mL IVPB  Status:  Discontinued     3 g 100 mL/hr over 60 Minutes Intravenous  Once 10/29/14 2027 10/29/14 2046      Objective: Filed Weights   10/31/14 2037 11/01/14 1050 11/01/14 2147  Weight: 58.106 kg (128 lb 1.6 oz) 58.06 kg (128 lb)  49.034 kg (108 lb 1.6 oz)    Intake/Output Summary (Last 24 hours) at 11/02/14 1003 Last data filed at 11/02/14 0915  Gross per 24 hour  Intake 3323.67 ml  Output      0 ml  Net 3323.67 ml     Vitals Filed Vitals:   11/01/14 1738 11/01/14 2147 11/02/14 0420 11/02/14 0950  BP: 98/59 102/58 90/51 91/58   Pulse: 77 71 79 77  Temp: 97.9 F (36.6 C) 98.2 F (36.8 C) 98.1 F (36.7 C) 98.3 F (36.8 C)  TempSrc: Oral Oral Oral Oral  Resp: 15 16 18 16   Height:      Weight:  49.034 kg (108 lb 1.6 oz)    SpO2: 99% 99% 97% 98%    Exam:  General:  Pt is alert, not in acute distress  HEENT: No icterus, No thrush, oral mucosa moist- necrotic tongue-missing anterior aspect of tongue  Cardiovascular: regular rate and rhythm, S1/S2 No murmur  Respiratory: clear to auscultation bilaterally   Abdomen: Soft, +Bowel sounds, non  tender, non distended, no guarding  MSK: No LE edema, cyanosis or clubbing  Data Reviewed: Basic Metabolic Panel:  Recent Labs Lab 10/29/14 2216 10/30/14 0512 10/31/14 0600 11/01/14 0540 11/02/14 0625  NA 129* 134* 136 136 136  K 3.8 3.8 4.0 3.8 4.2  CL 89* 98* 99* 98* 99*  CO2 32 28 32 30 30  GLUCOSE 119* 97 101* 92 98  BUN 11 7 6  <5* 7  CREATININE 0.61 0.60* 0.64 0.60* 0.52*  CALCIUM 8.2* 7.8* 8.0* 8.4* 9.1   Liver Function Tests:  Recent Labs Lab 10/29/14 1549 10/29/14 2216 10/30/14 0512  AST 23 20 17   ALT 17 16* 14*  ALKPHOS 54 53 52  BILITOT 0.3 0.4 0.4  PROT 6.5 5.8* 5.6*  ALBUMIN 2.6* 2.4* 2.1*   No results for input(s): LIPASE, AMYLASE in the last 168 hours. No results for input(s): AMMONIA in the last 168 hours. CBC:  Recent Labs Lab 10/29/14 1549 10/29/14 2216 10/30/14 0512 10/31/14 0600  WBC 2.6* 2.6* 2.5* 2.9*  NEUTROABS 1.8 2.0 1.8  --   HGB 7.5* 7.5* 7.5* 7.4*  HCT 22.7* 22.5* 22.6* 21.8*  MCV 87.3 86.9 88.6 87.2  PLT 175 164 152 169   Cardiac Enzymes: No results for input(s): CKTOTAL, CKMB, CKMBINDEX,  TROPONINI in the last 168 hours. BNP (last 3 results) No results for input(s): BNP in the last 8760 hours.  ProBNP (last 3 results) No results for input(s): PROBNP in the last 8760 hours.  CBG:  Recent Labs Lab 10/31/14 1151 10/31/14 1621 10/31/14 2344 11/01/14 0405 11/01/14 0756  GLUCAP 131* 144* 164* 80 98    Recent Results (from the past 240 hour(s))  Culture, blood (routine x 2)     Status: None (Preliminary result)   Collection Time: 10/29/14  7:30 PM  Result Value Ref Range Status   Specimen Description BLOOD RIGHT ARM  Final   Special Requests BOTTLES DRAWN AEROBIC AND ANAEROBIC 5CC  Final   Culture NO GROWTH 3 DAYS  Final   Report Status PENDING  Incomplete  Culture, blood (routine x 2)     Status: None (Preliminary result)   Collection Time: 10/29/14  7:35 PM  Result Value Ref Range Status   Specimen Description BLOOD RIGHT FOREARM  Final   Special Requests BOTTLES DRAWN AEROBIC AND ANAEROBIC 5CC  Final   Culture NO GROWTH 3 DAYS  Final   Report Status PENDING  Incomplete  MRSA PCR Screening     Status: None   Collection Time: 10/30/14  7:30 AM  Result Value Ref Range Status   MRSA by PCR NEGATIVE NEGATIVE Final    Comment:        The GeneXpert MRSA Assay (FDA approved for NASAL specimens only), is one component of a comprehensive MRSA colonization surveillance program. It is not intended to diagnose MRSA infection nor to guide or monitor treatment for MRSA infections.   Gram stain     Status: None   Collection Time: 11/01/14 11:45 AM  Result Value Ref Range Status   Specimen Description BRONCHIAL ALVEOLAR LAVAGE  Final   Special Requests NONE  Final   Gram Stain   Final    MODERATE WBC PRESENT,BOTH PMN AND MONONUCLEAR NO ORGANISMS SEEN    Report Status 11/01/2014 FINAL  Final  Culture, bal-quantitative     Status: None (Preliminary result)   Collection Time: 11/01/14 11:45 AM  Result Value Ref Range Status   Specimen Description BRONCHIAL  ALVEOLAR LAVAGE  Final  Special Requests Immunocompromised  Final   Gram Stain   Final    MODERATE WBC PRESENT,BOTH PMN AND MONONUCLEAR NO SQUAMOUS EPITHELIAL CELLS SEEN NO ORGANISMS SEEN Performed at [facility] Performed at [facility]    Culture   Final    NO GROWTH 1 DAY Performed at [facility]    Report Status PENDING  Incomplete     Studies: Dg Chest Port 1 View  11/01/2014   CLINICAL DATA:  Post bronchoscopy and biopsy  EXAM: PORTABLE CHEST - 1 VIEW  COMPARISON:  CT chest 10/29/2014  FINDINGS: Cardiomediastinal silhouette is stable. Persistent infiltrate/pneumonia in right upper lobe. Again noted thick wall cavitary lesion in right upper lobe measures 2.8 cm stable in size from prior exam. Left lung is clear. No pulmonary edema. There is no pneumothorax.  IMPRESSION: Persistent infiltrate/pneumonia in right upper lobe. Stable cavitary lesion in right upper lobe. No pneumothorax.   Electronically Signed   By: Lahoma Crocker M.D.   On: 11/01/2014 12:35   Dg C-arm Bronchoscopy  11/01/2014   CLINICAL DATA:    C-ARM BRONCHOSCOPY  Fluoroscopy was utilized by the requesting physician.  No radiographic  interpretation.     Scheduled Meds:  Scheduled Meds: . enoxaparin (LOVENOX) injection  40 mg Subcutaneous Q24H  . feeding supplement (JEVITY 1.5 CAL/FIBER)  474 mL Per Tube BID   And  . feeding supplement (JEVITY 1.5 CAL/FIBER)  711 mL Per Tube Q24H  . fentaNYL  150 mcg Transdermal Q72H  . nicotine  21 mg Transdermal Daily  . nystatin  5 mL Oral QID  . piperacillin-tazobactam (ZOSYN)  IV  3.375 g Intravenous Q8H  . vancomycin  750 mg Intravenous Q12H   Continuous Infusions: . sodium chloride Stopped (11/01/14 1100)    Time spent on care of this patient: 56 min   [place], MD 11/02/2014, 10:03 AM  LOS: 3 days   [facility] Office  [phone] Pager - Text Page per [email or web address] If 7PM-7AM, please contact night-coverage  [email or web address]

## 2014-11-03 DIAGNOSIS — C760 Malignant neoplasm of head, face and neck: Secondary | ICD-10-CM

## 2014-11-03 DIAGNOSIS — Z431 Encounter for attention to gastrostomy: Secondary | ICD-10-CM

## 2014-11-03 DIAGNOSIS — C7801 Secondary malignant neoplasm of right lung: Secondary | ICD-10-CM

## 2014-11-03 DIAGNOSIS — Z720 Tobacco use: Secondary | ICD-10-CM

## 2014-11-03 DIAGNOSIS — M899 Disorder of bone, unspecified: Secondary | ICD-10-CM

## 2014-11-03 DIAGNOSIS — C7802 Secondary malignant neoplasm of left lung: Secondary | ICD-10-CM

## 2014-11-03 DIAGNOSIS — R599 Enlarged lymph nodes, unspecified: Secondary | ICD-10-CM

## 2014-11-03 DIAGNOSIS — E46 Unspecified protein-calorie malnutrition: Secondary | ICD-10-CM

## 2014-11-03 DIAGNOSIS — I871 Compression of vein: Secondary | ICD-10-CM

## 2014-11-03 DIAGNOSIS — G894 Chronic pain syndrome: Secondary | ICD-10-CM

## 2014-11-03 DIAGNOSIS — J188 Other pneumonia, unspecified organism: Secondary | ICD-10-CM

## 2014-11-03 DIAGNOSIS — D701 Agranulocytosis secondary to cancer chemotherapy: Secondary | ICD-10-CM

## 2014-11-03 DIAGNOSIS — D6481 Anemia due to antineoplastic chemotherapy: Secondary | ICD-10-CM

## 2014-11-03 DIAGNOSIS — R52 Pain, unspecified: Secondary | ICD-10-CM

## 2014-11-03 DIAGNOSIS — D630 Anemia in neoplastic disease: Secondary | ICD-10-CM

## 2014-11-03 DIAGNOSIS — C801 Malignant (primary) neoplasm, unspecified: Secondary | ICD-10-CM

## 2014-11-03 LAB — CULTURE, BLOOD (ROUTINE X 2)
Culture: NO GROWTH
Culture: NO GROWTH

## 2014-11-03 LAB — TYPE AND SCREEN
ABO/RH(D): O POS
Antibody Screen: NEGATIVE

## 2014-11-03 MED ORDER — FLORANEX PO PACK: 1.0000 g | PACK | Freq: Three times a day (TID) | ORAL | Status: DC

## 2014-11-03 MED ORDER — AMOXICILLIN-POT CLAVULANATE 875-125 MG PO TABS: 1.0000 | ORAL_TABLET | Freq: Two times a day (BID) | ORAL | Status: DC

## 2014-11-03 MED ORDER — CLINDAMYCIN HCL 300 MG PO CAPS: 300.0000 mg | ORAL_CAPSULE | Freq: Three times a day (TID) | ORAL | Status: DC

## 2014-11-03 NOTE — Progress Notes (Signed)
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 admitted with leaking PEG tube and progressive cavitary lung nodules questionable etiology  SIGNIFICANT EVENTS  6/20 bronch  STUDIES:  CT scan of chest 6/17 shows progressive cavitary nodules in both lungs with associated pneumonitis in the right lung  SUBJECTIVE:  Denies chest pain or dyspnea No hemoptysis  VITAL SIGNS: Temp:  [97.7 F (36.5 C)-98 F (36.7 C)] 97.7 F (36.5 C) (06/22 0838) Pulse Rate:  [58-69] 61 (06/22 0838) Resp:  [15-18] 15 (06/22 0838) BP: (88-93)/(54-61) 88/54 mmHg (06/22 0838) SpO2:  [99 %-100 %] 99 % (06/22 0838) Weight:  [126 lb (57.153 kg)] 126 lb (57.153 kg) (06/21 2048)  PHYSICAL EXAMINATION: General:  Thin white male in no acute distress Neuro:  Awake and alert no focal deficits HEENT:  Necrotic tongue seen with evidence of malignancy Cardiovascular:  Regular rate and rhythm normal S1-S2  Lungs:  Clear without rales or rhonchi Abdomen:  PEG tube in place soft nontender bowel sounds active,  erythema around PEG tube seen Musculoskeletal:  Full range of motion Skin:  Clear   Recent Labs Lab 10/31/14 0600 11/01/14 0540 11/02/14 0625  NA 136 136 136  K 4.0 3.8 4.2  CL 99* 98* 99*  CO2 32 30 30  BUN 6 <5* 7  CREATININE 0.64 0.60* 0.52*  GLUCOSE 101* 92 98    Recent Labs Lab 10/29/14 2216 10/30/14 0512 10/31/14 0600  HGB 7.5* 7.5* 7.4*  HCT 22.5* 22.6* 21.8*  WBC 2.6* 2.5* 2.9*  PLT 164 152 169   Dg Chest Port 1 View  11/01/2014   CLINICAL DATA:  Post bronchoscopy and biopsy  EXAM: PORTABLE CHEST - 1 VIEW  COMPARISON:  CT chest 10/29/2014  FINDINGS: Cardiomediastinal silhouette is stable. Persistent infiltrate/pneumonia in  right upper lobe. Again noted thick wall cavitary lesion in right upper lobe measures 2.8 cm stable in size from prior exam. Left lung is clear. No pulmonary edema. There is no pneumothorax.  IMPRESSION: Persistent infiltrate/pneumonia in right upper lobe. Stable cavitary lesion in right upper lobe. No pneumothorax.   Electronically Signed   By: Lahoma Crocker M.D.   On: 11/01/2014 12:35   Dg Swallowing Func-speech Pathology  11/02/2014    Objective Swallowing Evaluation:    Patient Details  Name: Derek Campos MRN: [id number] Date of Birth: 1971/04/02  Today's Date: 11/02/2014 Time: SLP Start Time (ACUTE ONLY): 1030-SLP Stop Time (ACUTE ONLY): 1125 SLP Time Calculation (min) (ACUTE ONLY): 55 min  Past Medical History:  Past Medical History  Diagnosis Date  . 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 Cancer 2015    tongue   Past Surgical History:  Past Surgical History  Procedure Laterality Date  . J peg    . Sp perc place gastric tube    . Video bronchoscopy Bilateral 11/01/2014    Procedure: VIDEO BRONCHOSCOPY WITH FLUORO;  Surgeon: Rigoberto Noel, MD;   Location: [place];  Service: Cardiopulmonary;  Laterality: Bilateral;    HPI:  Other Pertinent Information: Pt is a 44 year old male with metastatic  squamous cell carcinoma of the tongue. Patient's initial therapy was in  [place] for which he received radiation therapy followed by  chemotherapy. Pt  admitted with leaking PEG tube and progressive cavitary  lung nodules questionable etiology. CT scan of chest shows progressive  cavitary nodules in both lungs with associated pneumonitis in the right  lung.   No Data Recorded  Assessment / Plan / Recommendation CHL IP CLINICAL IMPRESSIONS 11/02/2014  Therapy Diagnosis Severe oral phase dysphagia;Moderate pharyngeal phase  dysphagia  Clinical Impression Pt demosntrates severe oral dysphagia due to squamous  cell carinoma of the tongue following radiation treatment. Pt has little  functional  movement of the tongue for PO intake and uses a suck to transit  bolus to he posterior oral cavity and then a posterior head tilt to  transit the bolus. It takes him 3-4 attempts to transit a puree bolus.  Oropharyngeal phase also impaired with decreased base of tongue retraction  with limited epigottic deflection and poor laryngeal closure. There is  also a delay in swallow due to pts method of transit. Thin liquids are  consistently penetrated during the swallow, pt clears throat and swallows  again, but is unable to expel most penetreate. Nectar thick liquids result  in only trace penetration which are sensed and expelled. There is mild  valleculate residuals post swallow. Recommend pt conume pureed solids and  nectar thick liquids if he wants POs in addition to PEG tube feeding.  Provided written and verbal education. SLP will f/u for further  instruction in thickening liquids.       CHL IP TREATMENT RECOMMENDATION 11/02/2014  Treatment Recommendations Therapy as outlined in treatment plan below     CHL IP DIET RECOMMENDATION 11/02/2014  SLP Diet Recommendations Dysphagia 1 (Puree);Nectar  Liquid Administration via (None)  Medication Administration Via alternative means  Compensations Slow rate;Small sips/bites;Multiple dry swallows after each  bite/sip;Clear throat intermittently  Postural Changes and/or Swallow Maneuvers (None)     CHL IP OTHER RECOMMENDATIONS 11/02/2014  Recommended Consults (None)  Oral Care Recommendations Oral care QID  Other Recommendations Order thickener from pharmacy;Have oral suction  available     No flowsheet data found.   CHL IP FREQUENCY AND DURATION 11/02/2014  Speech Therapy Frequency (ACUTE ONLY) min 2x/week  Treatment Duration 2 weeks     Pertinent Vitals/Pain NA    SLP Swallow Goals No flowsheet data found.  No flowsheet data found.    CHL IP REASON FOR REFERRAL 11/02/2014  Reason for Referral Objectively evaluate swallowing function                   DeBlois, Katherene Ponto  11/02/2014, 2:48 PM    Dg C-arm Bronchoscopy  11/01/2014   CLINICAL DATA:    C-ARM BRONCHOSCOPY  Fluoroscopy was utilized by the requesting physician.  No radiographic  interpretation.    NOTE CT CHEST FROM 09/2014 at [facility]: CT Chest W Contrast - Final result (09/16/2014 2:51 PM) CT Chest W Contrast - Final result (09/16/2014 2:51 PM)  Impressions    1. Similar appearance of multiple irregular and spiculated pulmonary nodules, majority of which are cavitary, consistent with metastatic disease.   2. Suspected endobronchial metastases within the lingula as described above and mild lingular postobstructive pneumonia/pneumonitis.  3. Near resolution of previously visualized left upper lobe airspace consolidation consistent with inflammation/infection with multiple residual ground glass opacities persisting, likely representing residual infection however, additional sites of metastatic disease are not excluded. Continued followup is anticipated.  4. Enlarging necrotic lower right paratracheal/precarinal nodal mass with significant compression and likely invasion of the superior vena cava.  5. Mild bilateral pleural nodularity  and thickening, possibly postinflammatory or reactive though metastatic involvement cannot be excluded. Continued followup anticipated.     ASSESSMENT / PLAN:  #1 progression in bilateral lung nodules with cavitary lesions seen in right upper lobe nodule with air-fluid level. There is also not mass like density seen in the mediastinum and lytic lesion in L5 vertebra. These findings are consistent with metastatic squamous cell carcinoma tongue however there is surrounding pneumonitis in the right upper lobe and the patient has had difficulty with swallowing and I suspect some of the process is from abscess formation with chronic aspiration pneumonitis. The new right upper lobe cavity-was not seen on PET scan 08/2014 Note outside CT scan has been obtained from [facility]  in May 2016 and did suggest endobronchial metastases within the left lingula with postobstructive pneumonitis a similar process may well be occurring in the right lobe as well. There is also enlarging necrotic right lower paratracheal /precarinal nodal mass with compression and invasion of the superior vena cava. There is pleural nodularity and thickening compatible with metastatic involvement as well. Most of the pulmonary nodules are irregular and spiculated and are consistent with metastatic disease.   Plan   Follow-up gram-negative rods and BAL culture-can change to oral anti-biotics based on sensitivity for at least 2 weeks-suspect that cavitation is related to right upper lobe aspiration                        Dr Marin Olp following - cavitary lesion  Infectious-related to aspiration -since was not there in 08/2014 , mediastinal mass - unclear, bone lesions likely malignant    We will arrange outpatient follow-up in 2-4 weeks to reimage    [facility] V.MD Kara Mead MD. [facility]. [place] Pager [phone] If no response call [phone]    11/03/2014, 10:26 AM

## 2014-11-03 NOTE — Discharge Summary (Signed)
Discharge Summary  Derek Campos GGY:[id number] DOB: [birth year]  PCP: No PCP Per Patient  Admit date: 10/29/2014 Discharge date: 11/03/2014  Time spent: >67mins  Recommendations for Outpatient Follow-up:  F/u with oncology Dr. Marin Olp on 6/27, Dr. Marin Olp to arrange mri and possible radiation treatment for presumed svc tumor extension. F/u with pulmonary for final Bal result and abx treatment  Discharge Diagnoses:  Active Hospital Problems   Diagnosis Date Noted  . HCAP (healthcare-associated pneumonia) 10/30/2014  . Malnutrition of moderate degree 10/31/2014  . PEG (percutaneous endoscopic gastrostomy) adjustment/replacement/removal   . Sepsis 10/30/2014  . Metastasis 10/30/2014  . Cavitary lesion of lung 10/30/2014  . Pancytopenia due to chemotherapy 10/30/2014  . 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 Chronic pain syndrome 08/11/2014  . Oral cancer 08/11/2014  . S/P percutaneous endoscopic gastrostomy (PEG) tube placement 08/11/2014  . Tobacco use disorder 08/11/2014    Resolved Hospital Problems   Diagnosis Date Noted Date Resolved  No resolved problems to display.    Discharge Condition: stable  Diet recommendation: heart healthy/carb modified  Filed Weights   11/01/14 1050 11/01/14 2147 11/02/14 2048  Weight: 58.06 kg (128 lb) 49.034 kg (108 lb 1.6 oz) 57.153 kg (126 lb)    History of present illness:  Derek Campos is a 44 y.o. 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 The patient  presented on 6/18 with complaints of hiccups ongoing since last 2 days He also has cough with brownish expectoration ongoing since last few weeks. He complains of fever and chills. He denies any chest pain or abdominal pain nausea or vomiting diarrhea or constipation. He also denies any burning urination. He has a PEG tube which while he was trying to flush it felt that his tube was leaking  from his side. He denies any fall trauma or injury. He denies any active bleeding. He denies any focal deficit.  The patient is coming from home. And at his baseline independent for most of his ADL.  Hospital Course:  Principal Problem:   HCAP (healthcare-associated pneumonia) Active Problems:   Oral cancer   S/P percutaneous endoscopic gastrostomy (PEG) tube placement   Chronic pain syndrome   Tobacco use disorder   Sepsis   Metastasis   Cavitary lesion of lung   Pancytopenia due to chemotherapy   Superior vena cava thrombosis   Malnutrition of moderate degree   PEG (percutaneous endoscopic gastrostomy) adjustment/replacement/removal  ? pneumonia -Cavitary lesions with air-fluid level in the right upper lobe lesion- aspiration versus necrotic metastasis with infection -was treated with vancomycinx4d and Zosynx5days in the hospital -underwent bronch and biopsies - no endobronchial lesions -A recent PET scan showed that some of these nodules didn't "light up" which may be secondary to cancer or infection -strep pneumonia and legionella negative -Aspergillus antigen, QuantiFERON Gold pending - gram stain on BAL negative for bacteria, preliminary culture + G-rods - MBS/speech therapy recommended puree diet and nectar thick liquid - per pulm ab Augmentin + Clinda x 2 wks and then outpatient follow up with pulm and repeat CXR.   Leukopenia/anemia -He is not neutropenic -Anemia panel reveal normal Iron, B12 and folate levels- anemia and leukopenia likely secondary to chemo- not symptomatic - will not transfuse   Hypotension -despite hypotension, lactic acid is normal revealing that he is perfusing well  Hyponatremia -Started on IV fluids in the ER with the suspicion that he was dehydrated -Sodium improved to 136 -  tolerating tube feeds - stopped IVF- sodium has remained stable   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 noted on CT performed  in ER -Progressive mediastinal metastatic disease with evidence of invasion of the adjacent SVC and small nonocclusive tumor versus bland thrombus-we'll need to continue to monitor him over the next few weeks/months for SVC syndrome- consider radiation for mediastinal metastasis -appreciate Dr Marin Olp input, patient is seen by Dr Martha Clan in the hospital, he is to see Dr. Martha Clan on 6/27 to discuss mri and possible radiation therapy.   S/P percutaneous endoscopic gastrostomy (PEG) tube placement -According to the patient this was leaking at home-he thought it was infected- asked IR to assess the tube -according to their assessment, it appeared to be working well-tube feeds started-no leakage noted  Moderate malnutrition - Tube feed regimen recommended: 7 cans Jevity 1.5 via PEG. Split into 3 feeding of 2-3-2.-Flush w/ 120 mls fluid before and after each feed   Chronic pain syndrome due to cancer -cont chronic pain medications which include a fentanyl patch and oxycodone   Tobacco use disorder -smoking 2-3 cigarettes per day-was placed on a high dose Nicotine patch based on patient request when he was found to be smoking an e cigarette in the room on 6/19- counseled by staff - does not need a 21 mg patch unless he is smoking 1PPD which he does not admit to- d/c Nicotine patch   Code Status: Full code Consultants: Dr. Cliffton Asters, oncology Dr Martha Clan Procedures: Bronch   Discharge Exam: BP 88/54 mmHg  Pulse 61  Temp(Src) 97.7 F (36.5 C) (Oral)  Resp 15  Ht 5\' 10"  (1.778 m)  Wt 57.153 kg (126 lb)  BMI 18.08 kg/m2  SpO2 99%  General: allopecia, pale, chronically ill but NAD Cardiovascular: RRR Respiratory: CTABL  Discharge Instructions You were cared for by a hospitalist during your hospital stay. If you have any questions about your discharge medications or the care you received while you were in the hospital after you are discharged, you can call the unit and asked to  speak with the hospitalist on call if the hospitalist that took care of you is not available. Once you are discharged, your primary care physician will handle any further medical issues. Please note that NO REFILLS for any discharge medications will be authorized once you are discharged, as it is imperative that you return to your primary care physician (or establish a relationship with a primary care physician if you do not have one) for your aftercare needs so that they can reassess your need for medications and monitor your lab values.  Discharge Instructions    Diet - low sodium heart healthy    Complete by:  As directed      Increase activity slowly    Complete by:  As directed             Medication List    STOP taking these medications        doxycycline 100 MG capsule  Commonly known as:  VIBRAMYCIN      TAKE these medications        amoxicillin-clavulanate 875-125 MG per tablet  Commonly known as:  AUGMENTIN  Take 1 tablet by mouth 2 (two) times daily.     clindamycin 300 MG capsule  Commonly known as:  CLEOCIN  Take 1 capsule (300 mg total) by mouth 3 (three) times daily.     dexamethasone 4 MG tablet  Commonly known as:  DECADRON  Take 2 tablets (8 mg total) by mouth 2 (two) times daily. Start the day before Taxotere. Then again the day after chemo for 3 days.     fentaNYL 75 MCG/HR  Commonly known as:  DURAGESIC - dosed mcg/hr  Place 2 patches (150 mcg total) onto the skin every 3 (three) days.     gabapentin 300 MG capsule  Commonly known as:  NEURONTIN  Take 2 capsules (600 mg total) by mouth 4 (four) times daily.     hydrocortisone cream 1 %  Apply 1 application topically at bedtime. Apply to face, hands, feet, neck, back and chest daily at bedtime     ibuprofen 200 MG tablet  Commonly known as:  ADVIL,MOTRIN  Take 200 mg by mouth every 6 (six) hours as needed.     lactobacillus Pack  Take 1 packet (1 g total) by mouth 3 (three) times daily with meals.       lidocaine 2 % solution  Commonly known as:  XYLOCAINE  Use as directed 20 mLs in the mouth or throat every 3 (three) hours as needed for mouth pain.     nicotine 14 mg/24hr patch  Commonly known as:  NICODERM CQ - dosed in mg/24 hours  Place 1 patch (14 mg total) onto the skin daily. For 6 weeks, then apply 7mg /24 hr for 2 weeks     nystatin 100000 UNIT/ML suspension  Commonly known as:  MYCOSTATIN  Take 5 mLs (500,000 Units total) by mouth 4 (four) times daily.     ondansetron 8 MG tablet  Commonly known as:  ZOFRAN  Take 1 tablet (8 mg total) by mouth 2 (two) times daily. Start the day after chemo for 3 days. Then take as needed for nausea or vomiting.     oxycodone 30 MG immediate release tablet  Commonly known as:  ROXICODONE  Take 1-1 1/2 tablets, if needed, every 4 hrs for pain     prochlorperazine 10 MG tablet  Commonly known as:  COMPAZINE  Take 1 tablet (10 mg total) by mouth every 6 (six) hours as needed (Nausea or vomiting).       Allergies  Allergen Reactions  . Other     Cat Gut Stitches - unknown reaction.Told by parents.       Follow-up Information    Follow up with PARRETT,TAMMY, NP On 11/25/2014.   Specialty:  Nurse Practitioner   Why:  4 pm   Contact information:   [address] [address] [phone]       Follow up with Volanda Napoleon, MD In 1 week.   Specialty:  Oncology   Why:  on 6/27   Contact information:   [address], [address] [place] [address] [phone]        The results of significant diagnostics from this hospitalization (including imaging, microbiology, ancillary and laboratory) are listed below for reference.    Significant Diagnostic Studies: Ct Chest W Contrast  10/29/2014   CLINICAL DATA:  44 year old male with foul smelling leakage from peg tube. Current history of squamous cell oral cancer. Abnormal chest x-ray today. Subsequent encounter.  EXAM: CT CHEST, ABDOMEN, AND PELVIS WITH  CONTRAST  TECHNIQUE: Multidetector CT imaging of the chest, abdomen and pelvis was performed following the standard protocol during bolus administration of intravenous contrast.  CONTRAST:  181mL OMNIPAQUE IOHEXOL 300 MG/ML  SOLN  COMPARISON:  [facility] PET-CT 09/03/2014  FINDINGS: CT CHEST FINDINGS  New large area  of combined ground-glass opacity and consolidation in the right upper lobe. Chronic right apical cavitary lesion measuring 2.4 cm is stable (series 3, image 16), however, there is a new cavitary lesion with a fluid level at the epicenter of the consolidated lung measuring up to 33 mm diameter. This is thick walled.  There is a small area of solid and ground-glass nodular opacity along the posterior aspect of the right middle lobe which is new. The right lower lobe is stable.  The left upper lobe is improved with regressed peri-bronchovascular and perihilar patchy opacity. An anterior left upper lobe cavity on image 20 is slightly larger but remains thin walled. This currently is 15 mm diameter.  The left lung otherwise is stable.  No pleural or pericardial effusions.  Abnormal low-density mediastinal soft tissue has enlarged since April and now encompasses 25 x 39 mm (AP by transverse). With mass effect on the SVC which remains patent, although the SVC may be invaded posteriorly with evidence of bland or tumor thrombus (series 2, image 35).  The thoracic aorta and proximal great vessels are patent. The central pulmonary arteries appear patent.  No axillary lymphadenopathy. No acute or suspicious osseous lesion in the chest.  CT ABDOMEN AND PELVIS FINDINGS  There is a new lytic lesion in the left L5 vertebra anteriorly (series 2, image 93). No other suspicious or acute osseous lesion identified.  Retained stool mixed with contrast in the colon. No dilated large or small bowel loops.  Motion artifact in the upper abdomen including at the level of the percutaneous gastrostomy tube. Mild soft  tissue thickening along the course of the tube. The stomach is decompressed. There do does appear to be mild indistinct stranding around the lesser sac. No definite upper abdominal free fluid.  Liver, gallbladder,7 spleen, pancreas, and adrenal glands appear within normal limits allowing for motion. Major arterial structures in the abdomen and pelvis appear patent. Renal enhancement and contrast excretion within normal limits.  IMPRESSION: 1. New right upper lobe pneumonia with a new 3 cm central thick walled cavitary lesion containing a fluid level. Top differential considerations include pulmonary abscess and superinfected cavitary squamous cell lung metastasis. 2. Small focus of early infection in the right middle lobe. A small number of additional cavitary lung lesions remain indeterminate for scarring versus small mets. No pleural effusions. 3. Progressed mediastinal metastatic disease with evidence of invasion of the adjacent SVC and small nonocclusive tumor versus bland thrombus. 4. New L5 lytic bone lesion most compatible with bone metastasis. 5. Percutaneous gastrostomy tube appears normally placed. Suggestion of mild stranding around the stomach, might reflect infectious gastritis. Salient findings discussed by telephone with PA TIFFANY GREENE on 10/29/2014 at 23:56 .   Electronically Signed   By: Genevie Ann M.D.   On: 10/29/2014 23:59   Ct Abdomen Pelvis W Contrast  10/29/2014   CLINICAL DATA:  44 year old male with foul smelling leakage from peg tube. Current history of squamous cell oral cancer. Abnormal chest x-ray today. Subsequent encounter.  EXAM: CT CHEST, ABDOMEN, AND PELVIS WITH CONTRAST  TECHNIQUE: Multidetector CT imaging of the chest, abdomen and pelvis was performed following the standard protocol during bolus administration of intravenous contrast.  CONTRAST:  146mL OMNIPAQUE IOHEXOL 300 MG/ML  SOLN  COMPARISON:  [facility] PET-CT 09/03/2014  FINDINGS: CT CHEST FINDINGS  New large  area of combined ground-glass opacity and consolidation in the right upper lobe. Chronic right apical cavitary lesion measuring 2.4 cm is stable (series 3,  image 16), however, there is a new cavitary lesion with a fluid level at the epicenter of the consolidated lung measuring up to 33 mm diameter. This is thick walled.  There is a small area of solid and ground-glass nodular opacity along the posterior aspect of the right middle lobe which is new. The right lower lobe is stable.  The left upper lobe is improved with regressed peri-bronchovascular and perihilar patchy opacity. An anterior left upper lobe cavity on image 20 is slightly larger but remains thin walled. This currently is 15 mm diameter.  The left lung otherwise is stable.  No pleural or pericardial effusions.  Abnormal low-density mediastinal soft tissue has enlarged since April and now encompasses 25 x 39 mm (AP by transverse). With mass effect on the SVC which remains patent, although the SVC may be invaded posteriorly with evidence of bland or tumor thrombus (series 2, image 35).  The thoracic aorta and proximal great vessels are patent. The central pulmonary arteries appear patent.  No axillary lymphadenopathy. No acute or suspicious osseous lesion in the chest.  CT ABDOMEN AND PELVIS FINDINGS  There is a new lytic lesion in the left L5 vertebra anteriorly (series 2, image 93). No other suspicious or acute osseous lesion identified.  Retained stool mixed with contrast in the colon. No dilated large or small bowel loops.  Motion artifact in the upper abdomen including at the level of the percutaneous gastrostomy tube. Mild soft tissue thickening along the course of the tube. The stomach is decompressed. There do does appear to be mild indistinct stranding around the lesser sac. No definite upper abdominal free fluid.  Liver, gallbladder,7 spleen, pancreas, and adrenal glands appear within normal limits allowing for motion. Major arterial structures  in the abdomen and pelvis appear patent. Renal enhancement and contrast excretion within normal limits.  IMPRESSION: 1. New right upper lobe pneumonia with a new 3 cm central thick walled cavitary lesion containing a fluid level. Top differential considerations include pulmonary abscess and superinfected cavitary squamous cell lung metastasis. 2. Small focus of early infection in the right middle lobe. A small number of additional cavitary lung lesions remain indeterminate for scarring versus small mets. No pleural effusions. 3. Progressed mediastinal metastatic disease with evidence of invasion of the adjacent SVC and small nonocclusive tumor versus bland thrombus. 4. New L5 lytic bone lesion most compatible with bone metastasis. 5. Percutaneous gastrostomy tube appears normally placed. Suggestion of mild stranding around the stomach, might reflect infectious gastritis. Salient findings discussed by telephone with PA TIFFANY GREENE on 10/29/2014 at 23:56 .   Electronically Signed   By: Genevie Ann M.D.   On: 10/29/2014 23:59   Dg Chest Port 1 View  11/01/2014   CLINICAL DATA:  Post bronchoscopy and biopsy  EXAM: PORTABLE CHEST - 1 VIEW  COMPARISON:  CT chest 10/29/2014  FINDINGS: Cardiomediastinal silhouette is stable. Persistent infiltrate/pneumonia in right upper lobe. Again noted thick wall cavitary lesion in right upper lobe measures 2.8 cm stable in size from prior exam. Left lung is clear. No pulmonary edema. There is no pneumothorax.  IMPRESSION: Persistent infiltrate/pneumonia in right upper lobe. Stable cavitary lesion in right upper lobe. No pneumothorax.   Electronically Signed   By: Lahoma Crocker M.D.   On: 11/01/2014 12:35   Dg Abd Acute W/chest  10/29/2014   CLINICAL DATA:  Current history of tongue cancer.  EXAM: DG ABDOMEN ACUTE W/ 1V CHEST  COMPARISON:  None.  FINDINGS: Gastrostomy tube is noted  in left upper quadrant. No abnormal bowel gas pattern is noted. Stool is noted in the left colon and  rectum. Cardiomediastinal silhouette appears normal. Ill-defined airspace opacity is noted in right upper lobe with cavitary lesion with air-fluid level concerning for abscess. Another cavitary lesion is seen more superiorly in right upper lobe concerning for infection or malignancy. Ill-defined opacity is noted in left upper lobe concerning for early inflammation.  IMPRESSION: No evidence of bowel obstruction or ileus. Gastrostomy tube seen in the left upper quadrant of the abdomen.  Right upper lobe opacity is noted most consistent with pneumonia with cavitary lesion in air-fluid level concerning for abscess. Another cavitary lesion is seen more superiorly in the right upper lobe concerning for abscess or metastatic lesion. Ill-defined density is also seen in left upper lobe concerning for inflammation. CT scan of the chest is recommended for further evaluation.   Electronically Signed   By: Marijo Conception, M.D.   On: 10/29/2014 20:07   Dg Swallowing Func-speech Pathology  11/02/2014    Objective Swallowing Evaluation:    Patient Details  Name: Eulogio Requena MRN: [id number] Date of Birth: 22-Feb-1971  Today's Date: 11/02/2014 Time: SLP Start Time (ACUTE ONLY): 1030-SLP Stop Time (ACUTE ONLY): 1125 SLP Time Calculation (min) (ACUTE ONLY): 55 min  Past Medical History:  Past Medical History  Diagnosis Date  . 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 Cancer 2015    tongue   Past Surgical History:  Past Surgical History  Procedure Laterality Date  . J peg    . Sp perc place gastric tube    . Video bronchoscopy Bilateral 11/01/2014    Procedure: VIDEO BRONCHOSCOPY WITH FLUORO;  Surgeon: Rigoberto Noel, MD;   Location: [place];  Service: Cardiopulmonary;  Laterality: Bilateral;    HPI:  Other Pertinent Information: Pt is a 44 year old male with metastatic  squamous cell carcinoma of the tongue. Patient's initial therapy was in  [place] for which he received radiation therapy followed by   chemotherapy. Pt admitted with leaking PEG tube and progressive cavitary  lung nodules questionable etiology. CT scan of chest shows progressive  cavitary nodules in both lungs with associated pneumonitis in the right  lung.   No Data Recorded  Assessment / Plan / Recommendation CHL IP CLINICAL IMPRESSIONS 11/02/2014  Therapy Diagnosis Severe oral phase dysphagia;Moderate pharyngeal phase  dysphagia  Clinical Impression Pt demosntrates severe oral dysphagia due to squamous  cell carinoma of the tongue following radiation treatment. Pt has little  functional movement of the tongue for PO intake and uses a suck to transit  bolus to he posterior oral cavity and then a posterior head tilt to  transit the bolus. It takes him 3-4 attempts to transit a puree bolus.  Oropharyngeal phase also impaired with decreased base of tongue retraction  with limited epigottic deflection and poor laryngeal closure. There is  also a delay in swallow due to pts method of transit. Thin liquids are  consistently penetrated during the swallow, pt clears throat and swallows  again, but is unable to expel most penetreate. Nectar thick liquids result  in only trace penetration which are sensed and expelled. There is mild  valleculate residuals post swallow. Recommend pt conume pureed solids and  nectar thick liquids if he wants POs in addition to PEG tube feeding.  Provided written and verbal education. SLP will f/u for further  instruction in thickening liquids.  CHL IP TREATMENT RECOMMENDATION 11/02/2014  Treatment Recommendations Therapy as outlined in treatment plan below     CHL IP DIET RECOMMENDATION 11/02/2014  SLP Diet Recommendations Dysphagia 1 (Puree);Nectar  Liquid Administration via (None)  Medication Administration Via alternative means  Compensations Slow rate;Small sips/bites;Multiple dry swallows after each  bite/sip;Clear throat intermittently  Postural Changes and/or Swallow Maneuvers (None)     CHL IP OTHER  RECOMMENDATIONS 11/02/2014  Recommended Consults (None)  Oral Care Recommendations Oral care QID  Other Recommendations Order thickener from pharmacy;Have oral suction  available     No flowsheet data found.   CHL IP FREQUENCY AND DURATION 11/02/2014  Speech Therapy Frequency (ACUTE ONLY) min 2x/week  Treatment Duration 2 weeks     Pertinent Vitals/Pain NA    SLP Swallow Goals No flowsheet data found.  No flowsheet data found.    CHL IP REASON FOR REFERRAL 11/02/2014  Reason for Referral Objectively evaluate swallowing function                   DeBlois, Katherene Ponto 11/02/2014, 2:48 PM    Dg C-arm Bronchoscopy  11/01/2014   CLINICAL DATA:    C-ARM BRONCHOSCOPY  Fluoroscopy was utilized by the requesting physician.  No radiographic  interpretation.     Microbiology: Recent Results (from the past 240 hour(s))  Culture, blood (routine x 2)     Status: None (Preliminary result)   Collection Time: 10/29/14  7:30 PM  Result Value Ref Range Status   Specimen Description BLOOD RIGHT ARM  Final   Special Requests BOTTLES DRAWN AEROBIC AND ANAEROBIC 5CC  Final   Culture NO GROWTH 4 DAYS  Final   Report Status PENDING  Incomplete  Culture, blood (routine x 2)     Status: None (Preliminary result)   Collection Time: 10/29/14  7:35 PM  Result Value Ref Range Status   Specimen Description BLOOD RIGHT FOREARM  Final   Special Requests BOTTLES DRAWN AEROBIC AND ANAEROBIC 5CC  Final   Culture NO GROWTH 4 DAYS  Final   Report Status PENDING  Incomplete  MRSA PCR Screening     Status: None   Collection Time: 10/30/14  7:30 AM  Result Value Ref Range Status   MRSA by PCR NEGATIVE NEGATIVE Final    Comment:        The GeneXpert MRSA Assay (FDA approved for NASAL specimens only), is one component of a comprehensive MRSA colonization surveillance program. It is not intended to diagnose MRSA infection nor to guide or monitor treatment for MRSA infections.   Gram stain     Status: None   Collection  Time: 11/01/14 11:45 AM  Result Value Ref Range Status   Specimen Description BRONCHIAL ALVEOLAR LAVAGE  Final   Special Requests NONE  Final   Gram Stain   Final    MODERATE WBC PRESENT,BOTH PMN AND MONONUCLEAR NO ORGANISMS SEEN    Report Status 11/01/2014 FINAL  Final  Fungus Culture with Smear     Status: None (Preliminary result)   Collection Time: 11/01/14 11:45 AM  Result Value Ref Range Status   Specimen Description BRONCHIAL ALVEOLAR LAVAGE  Final   Special Requests NONE  Final   Fungal Smear   Final    NO YEAST OR FUNGAL ELEMENTS SEEN Performed at [facility]    Culture   Final    CULTURE IN PROGRESS FOR FOUR WEEKS Performed at [facility]    Report Status PENDING  Incomplete  AFB  culture with smear     Status: None (Preliminary result)   Collection Time: 11/01/14 11:45 AM  Result Value Ref Range Status   Specimen Description BRONCHIAL ALVEOLAR LAVAGE  Final   Special Requests Immunocompromised  Final   Acid Fast Smear   Final    NO ACID FAST BACILLI SEEN Performed at [facility]    Culture   Final    CULTURE WILL BE EXAMINED FOR 6 WEEKS BEFORE ISSUING A FINAL REPORT Performed at [facility]    Report Status PENDING  Incomplete  Culture, bal-quantitative     Status: None (Preliminary result)   Collection Time: 11/01/14 11:45 AM  Result Value Ref Range Status   Specimen Description BRONCHIAL ALVEOLAR LAVAGE  Final   Special Requests Immunocompromised  Final   Gram Stain   Final    MODERATE WBC PRESENT,BOTH PMN AND MONONUCLEAR NO SQUAMOUS EPITHELIAL CELLS SEEN NO ORGANISMS SEEN Performed at [facility] Performed at [place]   Final    10,000 COLONIES/ML Performed at [facility]    Culture   Final    [place] Performed at [facility]    Report Status PENDING  Incomplete     Labs: Basic Metabolic Panel:  Recent Labs Lab 10/29/14 2216 10/30/14 0512  10/31/14 0600 11/01/14 0540 11/02/14 0625  NA 129* 134* 136 136 136  K 3.8 3.8 4.0 3.8 4.2  CL 89* 98* 99* 98* 99*  CO2 32 28 32 30 30  GLUCOSE 119* 97 101* 92 98  BUN 11 7 6  <5* 7  CREATININE 0.61 0.60* 0.64 0.60* 0.52*  CALCIUM 8.2* 7.8* 8.0* 8.4* 9.1   Liver Function Tests:  Recent Labs Lab 10/29/14 1549 10/29/14 2216 10/30/14 0512  AST 23 20 17   ALT 17 16* 14*  ALKPHOS 54 53 52  BILITOT 0.3 0.4 0.4  PROT 6.5 5.8* 5.6*  ALBUMIN 2.6* 2.4* 2.1*   No results for input(s): LIPASE, AMYLASE in the last 168 hours. No results for input(s): AMMONIA in the last 168 hours. CBC:  Recent Labs Lab 10/29/14 1549 10/29/14 2216 10/30/14 0512 10/31/14 0600  WBC 2.6* 2.6* 2.5* 2.9*  NEUTROABS 1.8 2.0 1.8  --   HGB 7.5* 7.5* 7.5* 7.4*  HCT 22.7* 22.5* 22.6* 21.8*  MCV 87.3 86.9 88.6 87.2  PLT 175 164 152 169   Cardiac Enzymes: No results for input(s): CKTOTAL, CKMB, CKMBINDEX, TROPONINI in the last 168 hours. BNP: BNP (last 3 results) No results for input(s): BNP in the last 8760 hours.  ProBNP (last 3 results) No results for input(s): PROBNP in the last 8760 hours.  CBG:  Recent Labs Lab 10/31/14 1151 10/31/14 1621 10/31/14 2344 11/01/14 0405 11/01/14 0756  GLUCAP 131* 144* 164* 80 98       Signed:  Jayveion Stalling MD, PhD  [facility] 11/03/2014, 2:51 PM

## 2014-11-03 NOTE — Progress Notes (Signed)
Speech Language Pathology Treatment:    Patient Details Name: Derek Campos MRN: [id number] DOB: [birth year] Today's Date: 11/03/2014 Time: 1855-0158 SLP Time Calculation (min) (ACUTE ONLY): 13 min  Assessment / Plan / Recommendation Clinical Impression  Followed up with pt this am to provide instruction of safe swallowing strategies and thickened liquids. Pt reports he is tolerating nectar thick well and understands findings from MBS. SLP provided demonstration of thickening liquids and instructions on home use. Also discussed f/u with outpatient SLP experienced in head and neck cancer as pt reports he has never been treated by an SLP during this process. Pt to continue current diet and recommendations.   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 Patient's initial therapy was in [place] for which he received radiation therapy followed by chemotherapy. Pt admitted with leaking PEG tube and progressive cavitary lung nodules questionable etiology. CT scan of chest shows progressive cavitary nodules in both lungs with associated pneumonitis in the right lung.    Pertinent Vitals    SLP Plan  Continue with current plan of care    Recommendations Diet recommendations: Dysphagia 1 (puree);Nectar-thick liquid Liquids provided via: Cup Medication Administration: Via alternative means Supervision: Patient able to self feed Compensations: Slow rate;Small sips/bites;Multiple dry swallows after each bite/sip;Clear throat intermittently              Oral Care Recommendations: Oral care QID Plan: Continue with current plan of care    GO     Shelvia Fojtik, Katherene Ponto 11/03/2014, 9:22 AM

## 2014-11-04 LAB — CULTURE, BAL-QUANTITATIVE W GRAM STAIN: Colony Count: 10000

## 2014-11-04 LAB — CULTURE, BAL-QUANTITATIVE

## 2014-11-04 LAB — ASPERGILLUS GALACTOMANNAN ANTIGEN
ASPERGILLUS GALACTOMANNAN INDEX: 0.11 (ref <0.50)
Aspergillus galactomannan Ag: NOT DETECTED

## 2014-11-08 ENCOUNTER — Ambulatory Visit (HOSPITAL_BASED_OUTPATIENT_CLINIC_OR_DEPARTMENT_OTHER): Payer: No Typology Code available for payment source | Admitting: Hematology & Oncology

## 2014-11-08 ENCOUNTER — Other Ambulatory Visit: Payer: Self-pay | Admitting: Pulmonary Disease

## 2014-11-08 ENCOUNTER — Encounter: Payer: Self-pay | Admitting: Hematology & Oncology

## 2014-11-08 ENCOUNTER — Other Ambulatory Visit (HOSPITAL_BASED_OUTPATIENT_CLINIC_OR_DEPARTMENT_OTHER): Payer: No Typology Code available for payment source

## 2014-11-08 ENCOUNTER — Ambulatory Visit (HOSPITAL_BASED_OUTPATIENT_CLINIC_OR_DEPARTMENT_OTHER): Payer: No Typology Code available for payment source

## 2014-11-08 VITALS — BP 114/68 | HR 58 | Temp 97.7°F | Resp 18 | Ht 70.0 in | Wt 120.0 lb

## 2014-11-08 DIAGNOSIS — C029 Malignant neoplasm of tongue, unspecified: Secondary | ICD-10-CM

## 2014-11-08 DIAGNOSIS — C069 Malignant neoplasm of mouth, unspecified: Secondary | ICD-10-CM

## 2014-11-08 DIAGNOSIS — Z5111 Encounter for antineoplastic chemotherapy: Secondary | ICD-10-CM

## 2014-11-08 DIAGNOSIS — C760 Malignant neoplasm of head, face and neck: Secondary | ICD-10-CM

## 2014-11-08 LAB — CBC WITH DIFFERENTIAL (CANCER CENTER ONLY)
BASO#: 0 10*3/uL (ref 0.0–0.2)
BASO%: 0.6 % (ref 0.0–2.0)
EOS%: 0.3 % (ref 0.0–7.0)
Eosinophils Absolute: 0 10*3/uL (ref 0.0–0.5)
HCT: 28.2 % — ABNORMAL LOW (ref 38.7–49.9)
HGB: 8.9 g/dL — ABNORMAL LOW (ref 13.0–17.1)
LYMPH#: 0.4 10*3/uL — AB (ref 0.9–3.3)
LYMPH%: 10.8 % — AB (ref 14.0–48.0)
MCH: 30.1 pg (ref 28.0–33.4)
MCHC: 31.6 g/dL — AB (ref 32.0–35.9)
MCV: 95 fL (ref 82–98)
MONO#: 0.3 10*3/uL (ref 0.1–0.9)
MONO%: 9.4 % (ref 0.0–13.0)
NEUT#: 2.8 10*3/uL (ref 1.5–6.5)
NEUT%: 78.9 % (ref 40.0–80.0)
Platelets: 222 10*3/uL (ref 145–400)
RBC: 2.96 10*6/uL — ABNORMAL LOW (ref 4.20–5.70)
RDW: 14.9 % (ref 11.1–15.7)
WBC: 3.5 10*3/uL — ABNORMAL LOW (ref 4.0–10.0)

## 2014-11-08 LAB — CMP (CANCER CENTER ONLY)
ALK PHOS: 61 U/L (ref 26–84)
ALT: 20 U/L (ref 10–47)
AST: 20 U/L (ref 11–38)
Albumin: 3 g/dL — ABNORMAL LOW (ref 3.3–5.5)
BILIRUBIN TOTAL: 0.5 mg/dL (ref 0.20–1.60)
BUN, Bld: 20 mg/dL (ref 7–22)
CO2: 33 meq/L (ref 18–33)
CREATININE: 0.8 mg/dL (ref 0.6–1.2)
Calcium: 9.6 mg/dL (ref 8.0–10.3)
Chloride: 98 mEq/L (ref 98–108)
Glucose, Bld: 95 mg/dL (ref 73–118)
Potassium: 4.2 mEq/L (ref 3.3–4.7)
Sodium: 136 mEq/L (ref 128–145)
Total Protein: 6.6 g/dL (ref 6.4–8.1)

## 2014-11-08 LAB — MAGNESIUM (CC13): Magnesium: 1.9 mg/dl (ref 1.5–2.5)

## 2014-11-08 MED ORDER — DOCETAXEL CHEMO INJECTION 160 MG/16ML
65.0000 mg/m2 | Freq: Once | INTRAVENOUS | Status: AC
  Administered 2014-11-08: 110 mg via INTRAVENOUS
  Filled 2014-11-08: qty 11

## 2014-11-08 MED ORDER — SODIUM CHLORIDE 0.9 % IV SOLN
Freq: Once | INTRAVENOUS | Status: AC
  Administered 2014-11-08: 13:00:00 via INTRAVENOUS
  Filled 2014-11-08: qty 8

## 2014-11-08 MED ORDER — PEGFILGRASTIM 6 MG/0.6ML ~~LOC~~ PSKT
6.0000 mg | PREFILLED_SYRINGE | Freq: Once | SUBCUTANEOUS | Status: AC
  Administered 2014-11-08: 6 mg via SUBCUTANEOUS
  Filled 2014-11-08: qty 0.6

## 2014-11-08 MED ORDER — DIPHENHYDRAMINE HCL 50 MG/ML IJ SOLN
INTRAMUSCULAR | Status: AC
  Filled 2014-11-08: qty 1

## 2014-11-08 MED ORDER — DIPHENHYDRAMINE HCL 50 MG/ML IJ SOLN
50.0000 mg | Freq: Once | INTRAMUSCULAR | Status: AC
  Administered 2014-11-08: 50 mg via INTRAVENOUS

## 2014-11-08 MED ORDER — SODIUM CHLORIDE 0.9 % IV SOLN
550.0000 mg | Freq: Once | INTRAVENOUS | Status: AC
  Administered 2014-11-08: 550 mg via INTRAVENOUS
  Filled 2014-11-08: qty 55

## 2014-11-08 MED ORDER — HEPARIN SOD (PORK) LOCK FLUSH 100 UNIT/ML IV SOLN
250.0000 [IU] | Freq: Once | INTRAVENOUS | Status: DC | PRN
  Filled 2014-11-08: qty 5

## 2014-11-08 MED ORDER — SODIUM CHLORIDE 0.9 % IV SOLN
Freq: Once | INTRAVENOUS | Status: AC
  Administered 2014-11-08: 12:00:00 via INTRAVENOUS

## 2014-11-08 MED ORDER — CETUXIMAB CHEMO IV INJECTION 200 MG/100ML
250.0000 mg/m2 | Freq: Once | INTRAVENOUS | Status: AC
  Administered 2014-11-08: 400 mg via INTRAVENOUS
  Filled 2014-11-08: qty 200

## 2014-11-08 MED ORDER — SODIUM CHLORIDE 0.9 % IJ SOLN
10.0000 mL | INTRAMUSCULAR | Status: DC | PRN
  Administered 2014-11-08: 10 mL
  Filled 2014-11-08: qty 10

## 2014-11-08 MED ORDER — CIPROFLOXACIN HCL 500 MG PO TABS: 500.0000 mg | ORAL_TABLET | Freq: Two times a day (BID) | ORAL | Status: DC

## 2014-11-08 MED ORDER — HEPARIN SOD (PORK) LOCK FLUSH 100 UNIT/ML IV SOLN
500.0000 [IU] | Freq: Once | INTRAVENOUS | Status: DC | PRN
  Filled 2014-11-08: qty 5

## 2014-11-08 NOTE — Patient Instructions (Signed)
[facility] Discharge Instructions for Patients Receiving Chemotherapy  Today you received the following chemotherapy agents Erbitux, Carboplatin, Taxotere  To help prevent nausea and vomiting after your treatment, we encourage you to take your nausea medication  1) Zofran (Ondansetron) 8 mg.  Take 1 tablet in the morning and one in the evening beginning day after chemotherapy.  Take for 3 days.  2) Decadron - Continue taking Decadron for 3 days after chemotherapy.  3) Compazine (Prochlorperazine) Take 1 tablet by mouth every 6 hours AS NEEDED for nausea.     If you develop nausea and vomiting that is not controlled by your nausea medication, call the clinic.   BELOW ARE SYMPTOMS THAT SHOULD BE REPORTED IMMEDIATELY:  *FEVER GREATER THAN 100.5 F  *CHILLS WITH OR WITHOUT FEVER  NAUSEA AND VOMITING THAT IS NOT CONTROLLED WITH YOUR NAUSEA MEDICATION  *UNUSUAL SHORTNESS OF BREATH  *UNUSUAL BRUISING OR BLEEDING  TENDERNESS IN MOUTH AND THROAT WITH OR WITHOUT PRESENCE OF ULCERS  *URINARY PROBLEMS  *BOWEL PROBLEMS  UNUSUAL RASH Items with * indicate a potential emergency and should be followed up as soon as possible.  Feel free to call the clinic you have any questions or concerns. The clinic phone number is [phone].  Please show the [place] at check-in to the Emergency Department and triage nurse.      Cetuximab injection What is this medicine? CETUXIMAB (se TUX i mab) is a chemotherapy drug. It targets a specific protein within cancer cells and stops the cells from growing. It is used to treat colorectal cancer and head and neck cancer. This medicine may be used for other purposes; ask your health care provider or pharmacist if you have questions. COMMON BRAND NAME(S): Erbitux What should I tell my health care provider before I take this medicine? They need to know if you have any of these conditions: -heart disease -history of irregular  heartbeat -history of low levels of calcium, magnesium, or potassium in the blood -lung or breathing disease, like asthma -an unusual or allergic reaction to cetuximab, other medicines, foods, dyes, or preservatives -pregnant or trying to get pregnant -breast-feeding How should I use this medicine? This drug is given as an infusion into a vein. It is administered in a hospital or clinic by a specially trained health care professional. Talk to your pediatrician regarding the use of this medicine in children. Special care may be needed. Overdosage: If you think you have taken too much of this medicine contact a poison control center or emergency room at once. NOTE: This medicine is only for you. Do not share this medicine with others. What if I miss a dose? It is important not to miss your dose. Call your doctor or health care professional if you are unable to keep an appointment. What may interact with this medicine? Interactions are not expected. This list may not describe all possible interactions. Give your health care provider a list of all the medicines, herbs, non-prescription drugs, or dietary supplements you use. Also tell them if you smoke, drink alcohol, or use illegal drugs. Some items may interact with your medicine. What should I watch for while using this medicine? Visit your doctor or health care professional for regular checks on your progress. This drug may make you feel generally unwell. This is not uncommon, as chemotherapy can affect healthy cells as well as cancer cells. Report any side effects. Continue your course of treatment even though you feel ill unless your  doctor tells you to stop. This medicine can make you more sensitive to the sun. Keep out of the sun while taking this medicine and for 2 months after the last dose. If you cannot avoid being in the sun, wear protective clothing and use sunscreen. Do not use sun lamps or tanning beds/booths. You may need blood work  done while you are taking this medicine. In some cases, you may be given additional medicines to help with side effects. Follow all directions for their use. Call your doctor or health care professional for advice if you get a fever, chills or sore throat, or other symptoms of a cold or flu. Do not treat yourself. This drug decreases your body's ability to fight infections. Try to avoid being around people who are sick. Avoid taking products that contain aspirin, acetaminophen, ibuprofen, naproxen, or ketoprofen unless instructed by your doctor. These medicines may hide a fever. Do not become pregnant while taking this medicine. Women should inform their doctor if they wish to become pregnant or think they might be pregnant. There is a potential for serious side effects to an unborn child. Use adequate birth control methods. Avoid pregnancy for at least 6 months after your last dose. Talk to your health care professional or pharmacist for more information. Do not breast-feed an infant while taking this medicine or during the 2 months after your last dose. What side effects may I notice from receiving this medicine? Side effects that you should report to your doctor or health care professional as soon as possible: -allergic reactions like skin rash, itching or hives, swelling of the face, lips, or tongue -breathing problems -changes in vision -fast, irregular heartbeat -feeling faint or lightheaded, falls -fever, chills -mouth sores -redness, blistering, peeling or loosening of the skin, including inside the mouth -trouble passing urine or change in the amount of urine -unusually weak or tired Side effects that usually do not require medical attention (report to your doctor or health care professional if they continue or are bothersome): -changes in skin like acne, cracks, skin dryness -constipation -diarrhea -headache -nail changes -nausea, vomiting -stomach upset -weight loss This list may  not describe all possible side effects. Call your doctor for medical advice about side effects. You may report side effects to FDA at 1-800-FDA-1088. Where should I keep my medicine? This drug is given in a hospital or clinic and will not be stored at home. NOTE: This sheet is a summary. It may not cover all possible information. If you have questions about this medicine, talk to your doctor, pharmacist, or health care provider.  2015, Elsevier/Gold Standard. (2013-08-12 16:14:34)  Carboplatin injection What is this medicine? CARBOPLATIN (KAR boe pla tin) is a chemotherapy drug. It targets fast dividing cells, like cancer cells, and causes these cells to die. This medicine is used to treat ovarian cancer and many other cancers. This medicine may be used for other purposes; ask your health care provider or pharmacist if you have questions. COMMON BRAND NAME(S): Paraplatin What should I tell my health care provider before I take this medicine? They need to know if you have any of these conditions: -blood disorders -hearing problems -kidney disease -recent or ongoing radiation therapy -an unusual or allergic reaction to carboplatin, cisplatin, other chemotherapy, other medicines, foods, dyes, or preservatives -pregnant or trying to get pregnant -breast-feeding How should I use this medicine? This drug is usually given as an infusion into a vein. It is administered in a hospital or clinic by  a specially trained health care professional. Talk to your pediatrician regarding the use of this medicine in children. Special care may be needed. Overdosage: If you think you have taken too much of this medicine contact a poison control center or emergency room at once. NOTE: This medicine is only for you. Do not share this medicine with others. What if I miss a dose? It is important not to miss a dose. Call your doctor or health care professional if you are unable to keep an appointment. What may  interact with this medicine? -medicines for seizures -medicines to increase blood counts like filgrastim, pegfilgrastim, sargramostim -some antibiotics like amikacin, gentamicin, neomycin, streptomycin, tobramycin -vaccines Talk to your doctor or health care professional before taking any of these medicines: -acetaminophen -aspirin -ibuprofen -ketoprofen -naproxen This list may not describe all possible interactions. Give your health care provider a list of all the medicines, herbs, non-prescription drugs, or dietary supplements you use. Also tell them if you smoke, drink alcohol, or use illegal drugs. Some items may interact with your medicine. What should I watch for while using this medicine? Your condition will be monitored carefully while you are receiving this medicine. You will need important blood work done while you are taking this medicine. This drug may make you feel generally unwell. This is not uncommon, as chemotherapy can affect healthy cells as well as cancer cells. Report any side effects. Continue your course of treatment even though you feel ill unless your doctor tells you to stop. In some cases, you may be given additional medicines to help with side effects. Follow all directions for their use. Call your doctor or health care professional for advice if you get a fever, chills or sore throat, or other symptoms of a cold or flu. Do not treat yourself. This drug decreases your body's ability to fight infections. Try to avoid being around people who are sick. This medicine may increase your risk to bruise or bleed. Call your doctor or health care professional if you notice any unusual bleeding. Be careful brushing and flossing your teeth or using a toothpick because you may get an infection or bleed more easily. If you have any dental work done, tell your dentist you are receiving this medicine. Avoid taking products that contain aspirin, acetaminophen, ibuprofen, naproxen, or  ketoprofen unless instructed by your doctor. These medicines may hide a fever. Do not become pregnant while taking this medicine. Women should inform their doctor if they wish to become pregnant or think they might be pregnant. There is a potential for serious side effects to an unborn child. Talk to your health care professional or pharmacist for more information. Do not breast-feed an infant while taking this medicine. What side effects may I notice from receiving this medicine? Side effects that you should report to your doctor or health care professional as soon as possible: -allergic reactions like skin rash, itching or hives, swelling of the face, lips, or tongue -signs of infection - fever or chills, cough, sore throat, pain or difficulty passing urine -signs of decreased platelets or bleeding - bruising, pinpoint red spots on the skin, black, tarry stools, nosebleeds -signs of decreased red blood cells - unusually weak or tired, fainting spells, lightheadedness -breathing problems -changes in hearing -changes in vision -chest pain -high blood pressure -low blood counts - This drug may decrease the number of white blood cells, red blood cells and platelets. You may be at increased risk for infections and bleeding. -nausea and vomiting -  pain, swelling, redness or irritation at the injection site -pain, tingling, numbness in the hands or feet -problems with balance, talking, walking -trouble passing urine or change in the amount of urine Side effects that usually do not require medical attention (report to your doctor or health care professional if they continue or are bothersome): -hair loss -loss of appetite -metallic taste in the mouth or changes in taste This list may not describe all possible side effects. Call your doctor for medical advice about side effects. You may report side effects to FDA at 1-800-FDA-1088. Where should I keep my medicine? This drug is given in a hospital or  clinic and will not be stored at home. NOTE: This sheet is a summary. It may not cover all possible information. If you have questions about this medicine, talk to your doctor, pharmacist, or health care provider.  2015, Elsevier/Gold Standard. (2007-08-05 14:38:05)   Docetaxel injection What is this medicine? DOCETAXEL (doe se TAX el) is a chemotherapy drug. It targets fast dividing cells, like cancer cells, and causes these cells to die. This medicine is used to treat many types of cancers like breast cancer, certain stomach cancers, head and neck cancer, lung cancer, and prostate cancer. This medicine may be used for other purposes; ask your health care provider or pharmacist if you have questions. COMMON BRAND NAME(S): Docefrez, Taxotere What should I tell my health care provider before I take this medicine? They need to know if you have any of these conditions: -infection (especially a virus infection such as chickenpox, cold sores, or herpes) -liver disease -low blood counts, like low white cell, platelet, or red cell counts -an unusual or allergic reaction to docetaxel, polysorbate 80, other chemotherapy agents, other medicines, foods, dyes, or preservatives -pregnant or trying to get pregnant -breast-feeding How should I use this medicine? This drug is given as an infusion into a vein. It is administered in a hospital or clinic by a specially trained health care professional. Talk to your pediatrician regarding the use of this medicine in children. Special care may be needed. Overdosage: If you think you have taken too much of this medicine contact a poison control center or emergency room at once. NOTE: This medicine is only for you. Do not share this medicine with others. What if I miss a dose? It is important not to miss your dose. Call your doctor or health care professional if you are unable to keep an appointment. What may interact with this  medicine? -cyclosporine -erythromycin -ketoconazole -medicines to increase blood counts like filgrastim, pegfilgrastim, sargramostim -vaccines Talk to your doctor or health care professional before taking any of these medicines: -acetaminophen -aspirin -ibuprofen -ketoprofen -naproxen This list may not describe all possible interactions. Give your health care provider a list of all the medicines, herbs, non-prescription drugs, or dietary supplements you use. Also tell them if you smoke, drink alcohol, or use illegal drugs. Some items may interact with your medicine. What should I watch for while using this medicine? Your condition will be monitored carefully while you are receiving this medicine. You will need important blood work done while you are taking this medicine. This drug may make you feel generally unwell. This is not uncommon, as chemotherapy can affect healthy cells as well as cancer cells. Report any side effects. Continue your course of treatment even though you feel ill unless your doctor tells you to stop. In some cases, you may be given additional medicines to help with side effects. Follow  all directions for their use. Call your doctor or health care professional for advice if you get a fever, chills or sore throat, or other symptoms of a cold or flu. Do not treat yourself. This drug decreases your body's ability to fight infections. Try to avoid being around people who are sick. This medicine may increase your risk to bruise or bleed. Call your doctor or health care professional if you notice any unusual bleeding. Be careful brushing and flossing your teeth or using a toothpick because you may get an infection or bleed more easily. If you have any dental work done, tell your dentist you are receiving this medicine. Avoid taking products that contain aspirin, acetaminophen, ibuprofen, naproxen, or ketoprofen unless instructed by your doctor. These medicines may hide a  fever. This medicine contains an alcohol in the product. You may get drowsy or dizzy. Do not drive, use machinery, or do anything that needs mental alertness until you know how this medicine affects you. Do not stand or sit up quickly, especially if you are an older patient. This reduces the risk of dizzy or fainting spells. Avoid alcoholic drinks Do not become pregnant while taking this medicine. Women should inform their doctor if they wish to become pregnant or think they might be pregnant. There is a potential for serious side effects to an unborn child. Talk to your health care professional or pharmacist for more information. Do not breast-feed an infant while taking this medicine. What side effects may I notice from receiving this medicine? Side effects that you should report to your doctor or health care professional as soon as possible: -allergic reactions like skin rash, itching or hives, swelling of the face, lips, or tongue -low blood counts - This drug may decrease the number of white blood cells, red blood cells and platelets. You may be at increased risk for infections and bleeding. -signs of infection - fever or chills, cough, sore throat, pain or difficulty passing urine -signs of decreased platelets or bleeding - bruising, pinpoint red spots on the skin, black, tarry stools, nosebleeds -signs of decreased red blood cells - unusually weak or tired, fainting spells, lightheadedness -breathing problems -fast or irregular heartbeat -low blood pressure -mouth sores -nausea and vomiting -pain, swelling, redness or irritation at the injection site -pain, tingling, numbness in the hands or feet -swelling of the ankle, feet, hands -weight gain Side effects that usually do not require medical attention (report to your prescriber or health care professional if they continue or are bothersome): -bone pain -complete hair loss including hair on your head, underarms, pubic hair, eyebrows, and  eyelashes -diarrhea -excessive tearing -changes in the color of fingernails -loosening of the fingernails -nausea -muscle pain -red flush to skin -sweating -weak or tired This list may not describe all possible side effects. Call your doctor for medical advice about side effects. You may report side effects to FDA at 1-800-FDA-1088. Where should I keep my medicine? This drug is given in a hospital or clinic and will not be stored at home. NOTE: This sheet is a summary. It may not cover all possible information. If you have questions about this medicine, talk to your doctor, pharmacist, or health care provider.  2015, Elsevier/Gold Standard. (2013-03-26 22:21:02)

## 2014-11-08 NOTE — Progress Notes (Signed)
Hematology and Oncology Follow Up Visit  Koehn Salehi [id number] September 03, 1970 44 y.o. 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 Simonetti is back for follow-up. He is looking a lot better. His pain is much less now. He is really not had any flareups of pain. Papule recently was hospitalized. He had problems with his feeding tube. He actually was trying to drink liquids. He was aspirating. He was given his IV antibiotics. This appear to be clearing up upon his discharge.  He really feels that the treatments are helping. Again, his pain requirements are much less now.  He is not losing weight. He is doing more around the house. He actually is doing some painting in the complex that he lives.  He's had no bleeding.  He's had no diarrhea.  She's had no leg swelling.  Overall, his performance status is ECOG 1.  Medications:  Current outpatient prescriptions:  .  ciprofloxacin (CIPRO) 500 MG tablet, Take 1 tablet (500 mg total) by mouth 2 (two) times daily., Disp: 28 tablet, Rfl: 0 .  clindamycin (CLEOCIN) 300 MG capsule, Take 1 capsule (300 mg total) by mouth 3 (three) times daily., Disp: 36 capsule, Rfl: 0 .  dexamethasone (DECADRON) 4 MG tablet, Take 2 tablets (8 mg total) by mouth 2 (two) times daily. Start the day before Taxotere. Then again the day after chemo for 3 days., Disp: 30 tablet, Rfl: 1 .  fentaNYL (DURAGESIC - DOSED MCG/HR) 75 MCG/HR, Place 2 patches (150 mcg total) onto the skin every 3 (three) days., Disp: 20 patch, Rfl: 0 .  gabapentin (NEURONTIN) 600 MG tablet, Take 600 mg by mouth., Disp: , Rfl:  .  hydrocortisone cream 1 %, Apply 1 application topically at bedtime. Apply to face, hands, feet, neck, back and chest daily at bedtime (Patient not taking: Reported on 10/29/2014), Disp: 60 g, Rfl: 3 .  ibuprofen (ADVIL,MOTRIN) 200 MG  tablet, Take 200 mg by mouth every 6 (six) hours as needed., Disp: , Rfl:  .  lactobacillus (FLORANEX/LACTINEX) PACK, Take 1 packet (1 g total) by mouth 3 (three) times daily with meals., Disp: 30 packet, Rfl: 0 .  lidocaine (XYLOCAINE) 2 % solution, Use as directed 20 mLs in the mouth or throat every 3 (three) hours as needed for mouth pain., Disp: 1000 mL, Rfl: 1 .  nicotine (NICODERM CQ - DOSED IN MG/24 HOURS) 14 mg/24hr patch, Place 1 patch (14 mg total) onto the skin daily. For 6 weeks, then apply 7mg /24 hr for 2 weeks (Patient not taking: Reported on 10/29/2014), Disp: 56 patch, Rfl: 0 .  nystatin (MYCOSTATIN) 100000 UNIT/ML suspension, Take 5 mLs (500,000 Units total) by mouth 4 (four) times daily. (Patient not taking: Reported on 10/29/2014), Disp: 473 mL, Rfl: 3 .  omeprazole (PRILOSEC) 20 MG capsule, Take 20 mg by mouth., Disp: , Rfl:  .  ondansetron (ZOFRAN) 8 MG tablet, Take 1 tablet (8 mg total) by mouth 2 (two) times daily. Start the day after chemo for 3 days. Then take as needed for nausea or vomiting., Disp: 30 tablet, Rfl: 1 .  oxycodone (ROXICODONE) 30 MG immediate release tablet, Take 1-1 1/2 tablets, if needed, every 4 hrs for pain, Disp: 180 tablet, Rfl: 0 .  prochlorperazine (COMPAZINE) 10 MG tablet, Take 1 tablet (10 mg total) by mouth every 6 (six) hours as needed (  Nausea or vomiting)., Disp: 30 tablet, Rfl: 1  Allergies:  Allergies  Allergen Reactions  . Other     Cat Gut Stitches - unknown reaction.Told by parents.    Past Medical History, Surgical history, Social history, and Family History were reviewed and updated.  Review of Systems: As above  Physical Exam:  height is 5\' 10"  (1.778 m) and weight is 120 lb (54.432 kg). His oral temperature is 97.7 F (36.5 C). His blood pressure is 114/68 and his pulse is 58. His respiration is 18.   Wt Readings from Last 3 Encounters:  11/08/14 120 lb (54.432 kg)  11/02/14 126 lb (57.153 kg)  10/12/14 120 lb (54.432 kg)      Thin white gentleman. He is in no obvious distress. Head and neck exam shows his tongue to be significantly less inflamed and disfigured. There is not as much obvious tumor that is noted on the tongue. . 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 Surprisingly there is no adenopathy in his neck. Pupils react appropriately. Lungs are clear. Cardiac exam regular rate and rhythm with no murmurs, rubs or bruits. Abdomen is soft. Has a feeding tube as intact. There is no fluid wave. There is no palpable liver or spleen tip. Extremities shows no clubbing, cyanosis or edema. He has good strength in his extremities. He has good range of motion of his joints. Skin exam shows no rashes, ecchymoses or petechia. Neurological exam is nonfocal.  Lab Results  Component Value Date   WBC 3.5* 11/08/2014   HGB 8.9* 11/08/2014   HCT 28.2* 11/08/2014   MCV 95 11/08/2014   PLT 222 11/08/2014     Chemistry      Component Value Date/Time   NA 136 11/02/2014 0625   NA 137 10/25/2014 1018   NA 134 10/18/2014 1029   K 4.2 11/02/2014 0625   K 4.1 10/25/2014 1018   K 4.0 10/18/2014 1029   CL 99* 11/02/2014 0625   CL 95* 10/18/2014 1029   CO2 30 11/02/2014 0625   CO2 26 10/25/2014 1018   CO2 32 10/18/2014 1029   BUN 7 11/02/2014 0625   BUN 15.3 10/25/2014 1018   BUN 14 10/18/2014 1029   CREATININE 0.52* 11/02/2014 0625   CREATININE 0.7 10/25/2014 1018   CREATININE 0.7 10/18/2014 1029      Component Value Date/Time   CALCIUM 9.1 11/02/2014 0625   CALCIUM 9.3 10/25/2014 1018   CALCIUM 9.2 10/18/2014 1029   ALKPHOS 52 10/30/2014 0512   ALKPHOS 74 10/25/2014 1018   ALKPHOS 74 10/18/2014 1029   AST 17 10/30/2014 0512   AST 16 10/25/2014 1018   AST 20 10/18/2014 1029   ALT 14* 10/30/2014 0512   ALT 12 10/25/2014 1018   ALT 16 10/18/2014 1029   BILITOT 0.4 10/30/2014 0512   BILITOT 0.20 10/25/2014 1018   BILITOT 0.50 10/18/2014 1029         Impression and Plan: Mr. Neuharth is a  44 year old white gentleman with metastatic head and neck cancer.  Again, I have to believe that he is responding to treatment.  We will go ahead and get him treated this week. I will then plan for a follow-up PET scan in about 2 or 3 weeks. This really should give [place] an idea as to how well he is doing.  The fact that he is not taking as much in way of pain medication and the fact that he is not losing weight, would indicate  that he is responding.  I spent about 25-30 minutes with him today.   Volanda Napoleon, MD 6/27/201612:21 PM

## 2014-11-11 ENCOUNTER — Other Ambulatory Visit: Payer: Self-pay | Admitting: Hematology & Oncology

## 2014-11-11 DIAGNOSIS — C069 Malignant neoplasm of mouth, unspecified: Secondary | ICD-10-CM

## 2014-11-12 ENCOUNTER — Telehealth: Payer: Self-pay | Admitting: *Deleted

## 2014-11-12 ENCOUNTER — Telehealth: Payer: Self-pay | Admitting: Hematology & Oncology

## 2014-11-12 NOTE — Telephone Encounter (Signed)
Pt returned call and is aware of the MRI on July 13

## 2014-11-12 NOTE — Telephone Encounter (Signed)
Patient called asking to speak to RN. Phone call was returned and he wanted to speak to someone regarding schedule. Transferred patient to scheduler.

## 2014-11-12 NOTE — Telephone Encounter (Signed)
Tried to contact pt regarding MRI vm is full, mailing calendar out

## 2014-11-15 ENCOUNTER — Emergency Department (HOSPITAL_COMMUNITY): Payer: Medicaid Other

## 2014-11-15 ENCOUNTER — Emergency Department (HOSPITAL_COMMUNITY)
Admission: EM | Admit: 2014-11-15 | Discharge: 2014-11-16 | Disposition: A | Discharge status: Home / Self Care | Payer: Medicaid Other | Attending: Emergency Medicine | Admitting: Emergency Medicine

## 2014-11-15 ENCOUNTER — Encounter (HOSPITAL_COMMUNITY): Payer: Self-pay | Admitting: Emergency Medicine

## 2014-11-15 DIAGNOSIS — Z7952 Long term (current) use of systemic steroids: Secondary | ICD-10-CM | POA: Insufficient documentation

## 2014-11-15 DIAGNOSIS — C029 Malignant neoplasm of tongue, unspecified: Secondary | ICD-10-CM | POA: Diagnosis not present

## 2014-11-15 DIAGNOSIS — R112 Nausea with vomiting, unspecified: Secondary | ICD-10-CM | POA: Insufficient documentation

## 2014-11-15 DIAGNOSIS — Z87891 Personal history of nicotine dependence: Secondary | ICD-10-CM | POA: Diagnosis not present

## 2014-11-15 HISTORY — DX: Malignant neoplasm of mouth, unspecified: C06.9

## 2014-11-15 LAB — CBC WITH DIFFERENTIAL/PLATELET
Basophils Absolute: 0 10*3/uL (ref 0.0–0.1)
Basophils Relative: 1 % (ref 0–1)
EOS PCT: 0 % (ref 0–5)
Eosinophils Absolute: 0 10*3/uL (ref 0.0–0.7)
HEMATOCRIT: 26.9 % — AB (ref 39.0–52.0)
HEMOGLOBIN: 8.8 g/dL — AB (ref 13.0–17.0)
LYMPHS ABS: 0.6 10*3/uL — AB (ref 0.7–4.0)
LYMPHS PCT: 11 % — AB (ref 12–46)
MCH: 29.1 pg (ref 26.0–34.0)
MCHC: 32.7 g/dL (ref 30.0–36.0)
MCV: 89.1 fL (ref 78.0–100.0)
MONO ABS: 0.8 10*3/uL (ref 0.1–1.0)
Monocytes Relative: 16 % — ABNORMAL HIGH (ref 3–12)
NEUTROS ABS: 3.7 10*3/uL (ref 1.7–7.7)
NEUTROS PCT: 72 % (ref 43–77)
Platelets: 219 10*3/uL (ref 150–400)
RBC: 3.02 MIL/uL — ABNORMAL LOW (ref 4.22–5.81)
RDW: 15.6 % — ABNORMAL HIGH (ref 11.5–15.5)
WBC: 5 10*3/uL (ref 4.0–10.5)

## 2014-11-15 LAB — COMPREHENSIVE METABOLIC PANEL
ALBUMIN: 3.5 g/dL (ref 3.5–5.0)
ALK PHOS: 79 U/L (ref 38–126)
ALT: 15 U/L — ABNORMAL LOW (ref 17–63)
AST: 21 U/L (ref 15–41)
Anion gap: 11 (ref 5–15)
BUN: 23 mg/dL — AB (ref 6–20)
CO2: 32 mmol/L (ref 22–32)
Calcium: 9.3 mg/dL (ref 8.9–10.3)
Chloride: 92 mmol/L — ABNORMAL LOW (ref 101–111)
Creatinine, Ser: 0.74 mg/dL (ref 0.61–1.24)
Glucose, Bld: 113 mg/dL — ABNORMAL HIGH (ref 65–99)
Potassium: 4.2 mmol/L (ref 3.5–5.1)
Sodium: 135 mmol/L (ref 135–145)
Total Bilirubin: 0.2 mg/dL — ABNORMAL LOW (ref 0.3–1.2)
Total Protein: 7 g/dL (ref 6.5–8.1)

## 2014-11-15 MED ORDER — SODIUM CHLORIDE 0.9 % IV BOLUS (SEPSIS)
1000.0000 mL | Freq: Once | INTRAVENOUS | Status: AC
  Administered 2014-11-15: 1000 mL via INTRAVENOUS

## 2014-11-15 MED ORDER — ONDANSETRON HCL 4 MG/2ML IJ SOLN
4.0000 mg | Freq: Once | INTRAMUSCULAR | Status: AC
Start: 2014-11-15 — End: 2014-11-15
  Administered 2014-11-15: 4 mg via INTRAVENOUS
  Filled 2014-11-15: qty 2

## 2014-11-15 MED ORDER — PROCHLORPERAZINE EDISYLATE 5 MG/ML IJ SOLN
10.0000 mg | Freq: Once | INTRAMUSCULAR | Status: AC
  Administered 2014-11-15: 10 mg via INTRAVENOUS
  Filled 2014-11-15: qty 2

## 2014-11-15 NOTE — ED Notes (Signed)
Pt. reports persistent nausea and vomitting this week unrelieved by prescription antiemetic. Denies fever or chills.

## 2014-11-15 NOTE — ED Provider Notes (Signed)
CSN: [id number]     Arrival date & time 11/15/14  2200 History   First MD Initiated Contact with Patient 11/15/14 2221     Chief Complaint  Patient presents with  . Emesis     (Consider location/radiation/quality/duration/timing/severity/associated sxs/prior Treatment) HPI  Derek Campos is a 44 y.o. 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 Pt states he has been tolerating milk/formula until today. Pt has JPEG. Pt denies any abdominal pain, diarrhea. Last BM today and normal without blood or melanotic stool. No fevers or chills. No chest pain, SOB or lightheadedness. No weakness. No diarrhea. Oncologist Dr. Marin Olp. Pt admitted 10/30/14 for PNA from possible aspiration verses metastasis with infection. Pt placed on IV abx and discharged with Augmentin and Clinda. PT denies fevers, SOB or chest pain.    Past Medical History  Diagnosis Date  . 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 Cancer 2015    tongue  . Oral cancer    Past Surgical History  Procedure Laterality Date  . J peg    . Sp perc place gastric tube    . Video bronchoscopy Bilateral 11/01/2014    Procedure: VIDEO BRONCHOSCOPY WITH FLUORO;  Surgeon: Rigoberto Noel, MD;  Location: [facility];  Service: Cardiopulmonary;  Laterality: Bilateral;   Family History  Problem Relation Age of Onset  . Cancer Father   . Alcohol abuse Father    History  Substance Use Topics  . Smoking status: Former Smoker    Quit date: 09/12/2014  . Smokeless tobacco: Never Used     Comment: quit 09-12-14  . Alcohol Use: No    Review of Systems 10 Systems reviewed and are negative for acute change except as noted in the HPI.    Allergies  Other  Home Medications   Prior to Admission medications   Medication Sig Start Date End Date Taking? Authorizing Provider  ciprofloxacin (CIPRO) 500  MG tablet Take 1 tablet (500 mg total) by mouth 2 (two) times daily. 11/08/14   Rigoberto Noel, MD  clindamycin (CLEOCIN) 300 MG capsule Take 1 capsule (300 mg total) by mouth 3 (three) times daily. 11/03/14   Florencia Reasons, MD  dexamethasone (DECADRON) 4 MG tablet Take 2 tablets (8 mg total) by mouth 2 (two) times daily. Start the day before Taxotere. Then again the day after chemo for 3 days. 09/27/14   Volanda Napoleon, MD  fentaNYL (DURAGESIC - DOSED MCG/HR) 75 MCG/HR Place 2 patches (150 mcg total) onto the skin every 3 (three) days. 10/07/14   Volanda Napoleon, MD  gabapentin (NEURONTIN) 600 MG tablet Take 600 mg by mouth.    Historical Provider, MD  hydrocortisone cream 1 % Apply 1 application topically at bedtime. Apply to face, hands, feet, neck, back and chest daily at bedtime Patient not taking: Reported on 10/29/2014 09/27/14   Volanda Napoleon, MD  ibuprofen (ADVIL,MOTRIN) 200 MG tablet Take 200 mg by mouth every 6 (six) hours as needed.    Historical Provider, MD  lactobacillus (FLORANEX/LACTINEX) PACK Take 1 packet (1 g total) by mouth 3 (three) times daily with meals. 11/03/14   Florencia Reasons, MD  lidocaine (XYLOCAINE) 2 % solution Use as directed 20 mLs in the mouth or throat every 3 (three) hours as needed for mouth pain. 10/25/14   Volanda Napoleon, MD  nicotine (NICODERM CQ - DOSED IN MG/24 HOURS) 14 mg/24hr patch Place 1 patch (14 mg total) onto the skin daily. For 6 weeks, then apply 7mg /24 hr for 2 weeks Patient not taking: Reported on 10/29/2014 08/11/14   Arnoldo Morale, MD  nystatin (MYCOSTATIN) 100000 UNIT/ML suspension Take 5 mLs (500,000 Units total) by mouth 4 (four) times daily. Patient not taking: Reported on 10/29/2014 10/04/14   Eliezer Bottom, NP  omeprazole (PRILOSEC) 20 MG capsule Take 20 mg by mouth.    Historical Provider, MD  ondansetron (ZOFRAN) 8 MG tablet Take 1 tablet (8 mg total) by mouth 2 (two) times daily. Start the day after chemo for 3 days. Then take as needed for nausea or  vomiting. 09/27/14   Volanda Napoleon, MD  oxycodone (ROXICODONE) 30 MG immediate release tablet Take 1-1 1/2 tablets, if needed, every 4 hrs for pain 10/25/14   Volanda Napoleon, MD  prochlorperazine (COMPAZINE) 10 MG tablet Take 1 tablet (10 mg total) by mouth every 6 (six) hours as needed (Nausea or vomiting). 09/27/14   Volanda Napoleon, MD   BP 109/71 mmHg  Pulse 65  Temp(Src) 98.3 F (36.8 C) (Oral)  Resp 16  Ht 5\' 10"  (1.778 m)  Wt 120 lb 1 oz (54.46 kg)  BMI 17.23 kg/m2  SpO2 98% Physical Exam  Constitutional: He appears well-developed and well-nourished. No distress.  HENT:  Head: Normocephalic and atraumatic.  Large lesion to underside of tongue.  Eyes: Conjunctivae and EOM are normal. Right eye exhibits no discharge. Left eye exhibits no discharge.  Cardiovascular: Normal rate and regular rhythm.   Pulmonary/Chest: Effort normal and breath sounds normal. No respiratory distress. He has no wheezes.  Abdominal: Soft. Bowel sounds are normal. He exhibits no distension. There is no tenderness.  JPEG in place without tenderness, with appropriate erythema. No discharge. Appears to be in place.   Neurological: He is alert. He exhibits normal muscle tone. Coordination normal.  Skin: Skin is warm and dry. He is not diaphoretic.  Nursing note and vitals reviewed.   ED Course  Procedures (including critical care time) Labs Review Labs Reviewed  CBC WITH DIFFERENTIAL/PLATELET - Abnormal; Notable for the following:    RBC 3.02 (*)    Hemoglobin 8.8 (*)    HCT 26.9 (*)    RDW 15.6 (*)    Lymphocytes Relative 11 (*)    Lymphs Abs 0.6 (*)    Monocytes Relative 16 (*)    All other components within normal limits  COMPREHENSIVE METABOLIC PANEL - Abnormal; Notable for the following:    Chloride 92 (*)    Glucose, Bld 113 (*)    BUN 23 (*)    ALT 15 (*)    Total Bilirubin 0.2 (*)    All other components within normal limits    Imaging Review Dg Chest 2 View  11/16/2014   CLINICAL  DATA:  Acute onset of vomiting. Known pulmonary nodules and airspace consolidation. Initial encounter.  EXAM: CHEST  2 VIEW  COMPARISON:  CT of the chest performed 10/29/2014, and chest radiograph performed 11/01/2014  FINDINGS: The lungs are well-aerated. Right-sided airspace opacification has largely resolved, with minimal residual airspace opacity seen. The chronic cavitary mass at right lung apex measures approximately 3.7 cm. Difference in measurement reflects slightly improved differentiation from adjacent osseous structures. Additional nodules are better seen on the prior CT.  There is no evidence of pleural effusion or pneumothorax.  The heart is normal in size; the  mediastinal contour is within normal limits. No acute osseous abnormalities are seen.  IMPRESSION: Right-sided airspace opacification has largely resolved. Minimal residual airspace opacity seen. Chronic cavitary mass at the right lung apex again noted. Additional nodules are better seen on the prior CT.   Electronically Signed   By: Garald Balding M.D.   On: 11/16/2014 00:16     EKG Interpretation None      Meds given in ED:  Medications  sodium chloride 0.9 % bolus 1,000 mL (0 mLs Intravenous Stopped 11/16/14 0000)  ondansetron (ZOFRAN) injection 4 mg (4 mg Intravenous Given 11/15/14 2246)  prochlorperazine (COMPAZINE) injection 10 mg (10 mg Intravenous Given 11/15/14 2246)    Discharge Medication List as of 11/16/2014 12:25 AM        MDM   Final diagnoses:  Non-intractable vomiting with nausea, vomiting of unspecified type  Squamous cell cancer of tongue   Pt presenting after chemotherapy with intractable nausea and vomiting without success with oral antiemetics. No hematemesis. VSS. No abdominal pain. Pt given medications above and tolerating fluids by mouth (gatorade) and JPEG without nausea or emesis. Likely cyclic vomiting due to chemotherapy. Exam and presentation not consistent with obstruction. Pt well appearing  nontoxic and stable for discharge with follow up with his oncologist.   Discussed return precautions with patient. Discussed all results and patient verbalizes understanding and agrees with plan.  Case has been discussed with Dr. Lita Mains who agrees with the above plan and to discharge.    Al Corpus, PA-C 11/16/14 1653  Julianne Rice, MD 11/17/14 [phone]

## 2014-11-16 NOTE — Discharge Instructions (Signed)
Return to the emergency room with worsening of symptoms, new symptoms or with symptoms that are concerning , especially fevers, abdominal pain in one area, unable to keep down fluids, blood in stool or vomit, severe pain, you feel faint, lightheaded or pass out. Please call your doctor for a followup appointment within 24-48 hours. When you talk to your doctor please let them know that you were seen in the emergency department and have them acquire all of your records so that they can discuss the findings with you and formulate a treatment plan to fully care for your new and ongoing problems.  Read below information and follow recommendations.   Chemotherapy Many people are apprehensive about chemotherapy due to concerns over uncomfortable side effects. However, managements for side effects have come a long way. Many side effects once associated with chemotherapy can be prevented and/or controlled. WHAT IS CHEMOTHERAPY? Chemotherapy is the general term for any treatment involving the use of chemical agents. Chemotherapy can be given through a vein, most commonly through an implanted port* or PICC line.* It can also be delivered by mouth (orally) in the form of a pill. The main goal of chemotherapy is to kill cancer cells and stop them from growing. It can destroy and eliminate cancer cells where the cancer started (primary tumor location) and throughout the body, often far away from the original cancer. It is a treatment that not only targets the original cancer location, but also the entire body (systemic treatment) for full effect and results. Chemotherapy works by destroying cancer cells. Unfortunately, it cannot tell the difference between a cancer cell and some healthy cells. This results in the death of noncancerous cells, such as hair and blood cells. Harm to healthy cells is what causes side effects. These cells usually repair themselves after chemotherapy. Because some drugs work better together  rather than alone, 2 or more drugs are often given at the same time. This is called combination chemotherapy. Depending on the type of cancer and how advanced it is, chemotherapy can be used for different goals: 1. Cure the cancer. 2. Keep the cancer from spreading. 3. Slow the cancer's growth. 4. Kill cancer cells that may have spread to other parts of the body from the original tumor. 5. Relieve symptoms caused by cancer. You and your caregiver will decide what drug or combination of drugs you will get. Your caregiver will choose the doses, how the drugs will be given, how often, and how long you will get treatment. All of these decisions will depend on the type of cancer, where it is, how big it is, and how it is affecting your normal body functions and overall health. *Implanted port - A device that is implanted under your skin so that medicines may be delivered directly into your blood system. *PICC line (peripherally inserted central catheter) - A long, slender, flexible tube. This tube is often inserted into a vein, typically in the upper arm. The tip stops in the large central vein that leads to your heart. Document Released: 02/25/2007 Document Revised: 07/23/2011 Document Reviewed: 08/12/2008 [facility] Patient Information 2015 [place], [place]. This information is not intended to replace advice given to you by your health care provider. Make sure you discuss any questions you have with your health care provider.

## 2014-11-22 ENCOUNTER — Encounter: Payer: Self-pay | Admitting: *Deleted

## 2014-11-22 LAB — FUNGUS CULTURE W SMEAR: Fungal Smear: NONE SEEN

## 2014-11-22 NOTE — Progress Notes (Signed)
Patient has not had  last 2 Erbitux treatments.  PAtient to start Cycle 4, Day 1 on Monday July 18.  Dr. Marin Olp made aware.  Patient to pick up on Monday July 18 and start on Day 1 Cycle 4

## 2014-11-23 ENCOUNTER — Telehealth: Payer: Self-pay | Admitting: Hematology & Oncology

## 2014-11-23 ENCOUNTER — Other Ambulatory Visit (HOSPITAL_COMMUNITY): Payer: Self-pay

## 2014-11-23 NOTE — Telephone Encounter (Signed)
CONFIRMED APPT FOR July 18

## 2014-11-24 ENCOUNTER — Ambulatory Visit (HOSPITAL_COMMUNITY)
Admission: RE | Admit: 2014-11-24 | Discharge: 2014-11-24 | Disposition: A | Discharge status: Home / Self Care | Payer: Medicaid Other | Source: Ambulatory Visit | Attending: Hematology & Oncology | Admitting: Hematology & Oncology

## 2014-11-24 ENCOUNTER — Encounter: Payer: Self-pay | Admitting: Hematology & Oncology

## 2014-11-24 DIAGNOSIS — C029 Malignant neoplasm of tongue, unspecified: Secondary | ICD-10-CM | POA: Diagnosis not present

## 2014-11-24 DIAGNOSIS — C7951 Secondary malignant neoplasm of bone: Secondary | ICD-10-CM | POA: Insufficient documentation

## 2014-11-24 DIAGNOSIS — C069 Malignant neoplasm of mouth, unspecified: Secondary | ICD-10-CM

## 2014-11-24 MED ORDER — GADOBENATE DIMEGLUMINE 529 MG/ML IV SOLN
11.0000 mL | Freq: Once | INTRAVENOUS | Status: AC | PRN
  Administered 2014-11-24: 11 mL via INTRAVENOUS

## 2014-11-24 NOTE — Progress Notes (Signed)
I faxed application to [facility] to check status of application.

## 2014-11-25 ENCOUNTER — Ambulatory Visit (INDEPENDENT_AMBULATORY_CARE_PROVIDER_SITE_OTHER): Payer: No Typology Code available for payment source | Admitting: Internal Medicine

## 2014-11-25 ENCOUNTER — Encounter: Payer: Self-pay | Admitting: Internal Medicine

## 2014-11-25 ENCOUNTER — Ambulatory Visit (INDEPENDENT_AMBULATORY_CARE_PROVIDER_SITE_OTHER)
Admission: RE | Admit: 2014-11-25 | Discharge: 2014-11-25 | Disposition: A | Discharge status: Home / Self Care | Payer: No Typology Code available for payment source | Source: Ambulatory Visit | Attending: Internal Medicine | Admitting: Internal Medicine

## 2014-11-25 ENCOUNTER — Inpatient Hospital Stay: Payer: Self-pay | Admitting: Adult Health

## 2014-11-25 VITALS — BP 94/68 | HR 68 | Ht 70.0 in | Wt 119.0 lb

## 2014-11-25 DIAGNOSIS — Z720 Tobacco use: Secondary | ICD-10-CM

## 2014-11-25 DIAGNOSIS — J189 Pneumonia, unspecified organism: Secondary | ICD-10-CM

## 2014-11-25 DIAGNOSIS — J984 Other disorders of lung: Secondary | ICD-10-CM

## 2014-11-25 DIAGNOSIS — F17210 Nicotine dependence, cigarettes, uncomplicated: Secondary | ICD-10-CM

## 2014-11-25 NOTE — Patient Instructions (Addendum)
The key is to stop smoking completely before smoking completely stops you!   Please remember to go to the   x-ray department downstairs for your tests - we will call you with the results when they are available.    Finish up your antibiotics as planned - minimize your narcotic use if possible   Pulmonary follow up is as needed

## 2014-11-25 NOTE — Progress Notes (Signed)
Subjective:     Patient ID: Derek Campos, male   DOB: 03-05-1971,     MRN: [id number]  HPI  44 yowm active smoker last well April 2015 develped a tongue sore dx Oct 2015 rx chemo/RT [place], [place] completed all therapy in March 2016    Admit date: 10/29/2014 Discharge date: 11/03/2014  Time spent: >35mins  Recommendations for Outpatient Follow-up:  F/u with oncology Dr. Marin Olp on 6/27, Dr. Marin Olp to arrange mri and possible radiation treatment for presumed svc tumor extension.   Discharge Diagnoses:  Active Hospital Problems   Diagnosis Date Noted  . HCAP (healthcare-associated pneumonia) 10/30/2014  . Malnutrition of moderate degree 10/31/2014  . PEG (percutaneous endoscopic gastrostomy) adjustment/replacement/removal   . Sepsis 10/30/2014  . Metastasis 10/30/2014  . Cavitary lesion of lung 10/30/2014  . Pancytopenia due to chemotherapy 10/30/2014  . 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 Chronic pain syndrome 08/11/2014  . Oral cancer 08/11/2014  . S/P percutaneous endoscopic gastrostomy (PEG) tube placement 08/11/2014  . Tobacco use disorder 08/11/2014    Resolved Hospital Problems   Diagnosis Date Noted Date Resolved  No resolved problems to display.    Discharge Condition: stable  Diet recommendation: heart healthy/carb modified  Filed Weights   11/01/14 1050 11/01/14 2147 11/02/14 2048  Weight: 58.06 kg (128 lb) 49.034 kg (108 lb 1.6 oz) 57.153 kg (126 lb)    History of present illness:  Derek Campos is a 44 y.o. 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 The patient presented on 6/18 with complaints of hiccups ongoing since last 2 days He also has cough with brownish expectoration ongoing since last few weeks. He complains of fever and chills. He denies any chest pain or abdominal pain nausea or vomiting  diarrhea or constipation. He also denies any burning urination. He has a PEG tube which while he was trying to flush it felt that his tube was leaking from his side. He denies any fall trauma or injury. He denies any active bleeding. He denies any focal deficit.  The patient is coming from home. And at his baseline independent for most of his ADL.  Hospital Course:  Principal Problem:  HCAP (healthcare-associated pneumonia) Active Problems:  Oral cancer  S/P percutaneous endoscopic gastrostomy (PEG) tube placement  Chronic pain syndrome  Tobacco use disorder  Sepsis  Metastasis  Cavitary lesion of lung  Pancytopenia due to chemotherapy  Superior vena cava thrombosis  Malnutrition of moderate degree  PEG (percutaneous endoscopic gastrostomy) adjustment/replacement/removal  ? pneumonia -Cavitary lesions with air-fluid level in the right upper lobe lesion- aspiration versus necrotic metastasis with infection -was treated with vancomycinx4d and Zosynx5days in the hospital -underwent bronch and biopsies6/20/16  - no endobronchial lesions -A recent PET scan showed that some of these nodules didn't "light up" which may be secondary to cancer or infection -strep pneumonia and legionella negative -Aspergillus antigen, QuantiFERON Gold   - gram stain on BAL negative for bacteria,> no pathogenic orgs  - MBS/speech therapy recommended puree diet and nectar thick liquid - per pulm ab Augmentin + Clinda x 2 wks and then outpatient follow up with pulm and repeat CXR.   Leukopenia/anemia -He is not neutropenic -Anemia panel reveal normal Iron, B12 and folate levels- anemia and leukopenia likely secondary to chemo- not symptomatic - will not transfuse   Hypotension -despite hypotension, lactic acid is normal revealing that he is perfusing well  Hyponatremia -Started on  IV fluids in the ER with the suspicion that he was dehydrated -Sodium improved to 136 - tolerating tube  feeds - stopped IVF- sodium has remained stable   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 noted on CT performed in ER -Progressive mediastinal metastatic disease with evidence of invasion of the adjacent SVC and small nonocclusive tumor versus bland thrombus-we'll need to continue to monitor him over the next few weeks/months for SVC syndrome- consider radiation for mediastinal metastasis -appreciate Dr Marin Olp input, patient is seen by Dr Martha Clan in the hospital, he is to see Dr. Martha Clan on 6/27 to discuss mri and possible radiation therapy.   S/P percutaneous endoscopic gastrostomy (PEG) tube placement -According to the patient this was leaking at home-he thought it was infected- asked IR to assess the tube -according to their assessment, it appeared to be working well-tube feeds started-no leakage noted  Moderate malnutrition - Tube feed regimen recommended: 7 cans Jevity 1.5 via PEG. Split into 3 feeding of 2-3-2.-Flush w/ 120 mls fluid before and after each feed   Chronic pain syndrome due to cancer -cont chronic pain medications which include a fentanyl patch and oxycodone   Tobacco use disorder -smoking 2-3 cigarettes per day-was placed on a high dose Nicotine patch based on patient request when he was found to be smoking an e cigarette in the room on 6/19- counseled by staff - does not need a 21 mg patch unless he is smoking 1PPD which he does not admit to- d/c Nicotine patch     11/25/2014 f/u ov/Asyria Kolander re:  Cavitary pna/ pseudomonas on BAL Chief Complaint  Patient presents with  . HFU    Pt states his breathing is doing well. He has some cough- non prod.   finishing up cipro/clindamycin - was supposed to be over 14 days but "misses a few" - rx was started 11/08/14  Only using one prn narcotic to supplement his fentanyl patch  Not limited by breathing from desired activities    No obvious day to day or daytime variability or assoc excess/  purulent sputum or cp or chest tightness, subjective wheeze or overt sinus or hb symptoms. No unusual exp hx or h/o childhood pna/ asthma or knowledge of premature birth.  Sleeping ok without nocturnal  or early am exacerbation  of respiratory  c/o's or need for noct saba. Also denies any obvious fluctuation of symptoms with weather or environmental changes or other aggravating or alleviating factors except as outlined above   Current Medications, Allergies, Complete Past Medical History, Past Surgical History, Family History, and Social History were reviewed in [employer] record.  ROS  The following are not active complaints unless bolded sore throat, dysphagia, dental problems, itching, sneezing,  nasal congestion or excess/ purulent secretions, ear ache,   fever, chills, sweats, unintended wt loss, classically pleuritic or exertional cp, hemoptysis,  orthopnea pnd or leg swelling, presyncope, palpitations, abdominal pain, anorexia, nausea, vomiting, diarrhea  or change in bowel or bladder habits, change in stools or urine, dysuria,hematuria,  rash, arthralgias, visual complaints, headache, numbness, weakness or ataxia or problems with walking or coordination,  change in mood/affect or memory.          Review of Systems     Objective:   Physical Exam     Wt Readings from Last 3 Encounters:  11/25/14 119 lb (53.978 kg)  11/15/14 120 lb 1 oz (54.46 kg)  11/08/14 120 lb (54.432 kg)    Vital signs  reviewed  amb wm constantly clearing his throat and expectorating   HEENT: nl dentition, turbinates,  Nl external ear canals without cough reflex Tongue erythematous/ friable/ somewhat contracted esp L lateral aspect posteriorly   NECK :  without JVD/Nodes/TM/ nl carotid upstrokes bilaterally   LUNGS: no acc muscle use, clear to A and P bilaterally without cough on insp or exp maneuvers   CV:  RRR  no s3 or murmur or increase in P2, no edema   ABD:  soft and  nontender with nl excursion in the supine position. No bruits or organomegaly, bowel sounds nl - PEG in place   MS:  warm without deformities, calf tenderness, cyanosis or clubbing  SKIN: warm and dry without lesions    NEURO:  alert, approp, no deficits     CXR PA and Lateral:   11/25/2014 :     I personally reviewed images and agree with radiology impression as follows:    Persistent parenchymal abnormalities in the right upper lobe including a stable cavity measuring approximately 3.7 cm in greatest dimension. There is no acute pneumonia nor other acute cardiopulmonary abnormality. Previously described nodules elsewhere in both lungs on the CT scan of June 17th are not clearly evident on this plain radiographic series.   Assessment:

## 2014-11-28 ENCOUNTER — Encounter: Payer: Self-pay | Admitting: Internal Medicine

## 2014-11-29 ENCOUNTER — Other Ambulatory Visit (HOSPITAL_BASED_OUTPATIENT_CLINIC_OR_DEPARTMENT_OTHER): Payer: No Typology Code available for payment source

## 2014-11-29 ENCOUNTER — Telehealth: Payer: Self-pay | Admitting: Pharmacist

## 2014-11-29 ENCOUNTER — Encounter: Payer: Self-pay | Admitting: Hematology & Oncology

## 2014-11-29 ENCOUNTER — Ambulatory Visit (HOSPITAL_BASED_OUTPATIENT_CLINIC_OR_DEPARTMENT_OTHER): Payer: No Typology Code available for payment source

## 2014-11-29 ENCOUNTER — Ambulatory Visit (HOSPITAL_BASED_OUTPATIENT_CLINIC_OR_DEPARTMENT_OTHER): Payer: No Typology Code available for payment source | Admitting: Hematology & Oncology

## 2014-11-29 ENCOUNTER — Telehealth: Payer: Self-pay | Admitting: Hematology & Oncology

## 2014-11-29 VITALS — BP 91/51 | HR 62 | Temp 97.4°F | Resp 16 | Wt 120.0 lb

## 2014-11-29 VITALS — BP 103/70 | HR 70

## 2014-11-29 DIAGNOSIS — D509 Iron deficiency anemia, unspecified: Secondary | ICD-10-CM

## 2014-11-29 DIAGNOSIS — Z5112 Encounter for antineoplastic immunotherapy: Secondary | ICD-10-CM

## 2014-11-29 DIAGNOSIS — K909 Intestinal malabsorption, unspecified: Secondary | ICD-10-CM

## 2014-11-29 DIAGNOSIS — Z5111 Encounter for antineoplastic chemotherapy: Secondary | ICD-10-CM

## 2014-11-29 DIAGNOSIS — C760 Malignant neoplasm of head, face and neck: Secondary | ICD-10-CM

## 2014-11-29 DIAGNOSIS — C069 Malignant neoplasm of mouth, unspecified: Secondary | ICD-10-CM

## 2014-11-29 DIAGNOSIS — C029 Malignant neoplasm of tongue, unspecified: Secondary | ICD-10-CM

## 2014-11-29 DIAGNOSIS — M899 Disorder of bone, unspecified: Secondary | ICD-10-CM

## 2014-11-29 HISTORY — DX: Iron deficiency anemia, unspecified: D50.9

## 2014-11-29 HISTORY — DX: Intestinal malabsorption, unspecified: K90.9

## 2014-11-29 LAB — FERRITIN CHCC: Ferritin: 192 ng/ml (ref 22–316)

## 2014-11-29 LAB — CBC WITH DIFFERENTIAL (CANCER CENTER ONLY)
BASO#: 0 10*3/uL (ref 0.0–0.2)
BASO%: 0.3 % (ref 0.0–2.0)
EOS ABS: 0 10*3/uL (ref 0.0–0.5)
EOS%: 0 % (ref 0.0–7.0)
HEMATOCRIT: 30.9 % — AB (ref 38.7–49.9)
HGB: 10.2 g/dL — ABNORMAL LOW (ref 13.0–17.1)
LYMPH#: 0.4 10*3/uL — ABNORMAL LOW (ref 0.9–3.3)
LYMPH%: 4.8 % — ABNORMAL LOW (ref 14.0–48.0)
MCH: 30.7 pg (ref 28.0–33.4)
MCHC: 33 g/dL (ref 32.0–35.9)
MCV: 93 fL (ref 82–98)
MONO#: 0.9 10*3/uL (ref 0.1–0.9)
MONO%: 11.3 % (ref 0.0–13.0)
NEUT%: 83.6 % — AB (ref 40.0–80.0)
NEUTROS ABS: 6.3 10*3/uL (ref 1.5–6.5)
Platelets: 157 10*3/uL (ref 145–400)
RBC: 3.32 10*6/uL — AB (ref 4.20–5.70)
RDW: 17.8 % — ABNORMAL HIGH (ref 11.1–15.7)
WBC: 7.5 10*3/uL (ref 4.0–10.0)

## 2014-11-29 LAB — CMP (CANCER CENTER ONLY)
ALT(SGPT): 22 U/L (ref 10–47)
AST: 31 U/L (ref 11–38)
Albumin: 4 g/dL (ref 3.3–5.5)
Alkaline Phosphatase: 85 U/L — ABNORMAL HIGH (ref 26–84)
BUN: 31 mg/dL — AB (ref 7–22)
CO2: 33 mEq/L (ref 18–33)
Calcium: 10.2 mg/dL (ref 8.0–10.3)
Chloride: 94 mEq/L — ABNORMAL LOW (ref 98–108)
Creat: 1 mg/dl (ref 0.6–1.2)
GLUCOSE: 72 mg/dL — AB (ref 73–118)
POTASSIUM: 4.4 meq/L (ref 3.3–4.7)
Sodium: 143 mEq/L (ref 128–145)
TOTAL PROTEIN: 8 g/dL (ref 6.4–8.1)
Total Bilirubin: 0.6 mg/dl (ref 0.20–1.60)

## 2014-11-29 LAB — MAGNESIUM (CC13): MAGNESIUM: 2.5 mg/dL (ref 1.5–2.5)

## 2014-11-29 LAB — IRON AND TIBC CHCC
%SAT: 9 % — ABNORMAL LOW (ref 20–55)
Iron: 32 ug/dL — ABNORMAL LOW (ref 42–163)
TIBC: 355 ug/dL (ref 202–409)
UIBC: 323 ug/dL (ref 117–376)

## 2014-11-29 LAB — LACTATE DEHYDROGENASE: LDH: 241 U/L (ref 94–250)

## 2014-11-29 MED ORDER — PEGFILGRASTIM 6 MG/0.6ML ~~LOC~~ PSKT
6.0000 mg | PREFILLED_SYRINGE | Freq: Once | SUBCUTANEOUS | Status: AC
  Administered 2014-11-29: 6 mg via SUBCUTANEOUS
  Filled 2014-11-29: qty 0.6

## 2014-11-29 MED ORDER — SODIUM CHLORIDE 0.9 % IV SOLN
Freq: Once | INTRAVENOUS | Status: AC
  Administered 2014-11-29: 09:00:00 via INTRAVENOUS

## 2014-11-29 MED ORDER — HEPARIN SOD (PORK) LOCK FLUSH 100 UNIT/ML IV SOLN
500.0000 [IU] | Freq: Once | INTRAVENOUS | Status: DC | PRN
  Filled 2014-11-29: qty 5

## 2014-11-29 MED ORDER — DIPHENHYDRAMINE HCL 50 MG/ML IJ SOLN
50.0000 mg | Freq: Once | INTRAMUSCULAR | Status: AC
  Administered 2014-11-29: 50 mg via INTRAVENOUS

## 2014-11-29 MED ORDER — SODIUM CHLORIDE 0.9 % IV SOLN
Freq: Once | INTRAVENOUS | Status: AC
  Administered 2014-11-29: 09:00:00 via INTRAVENOUS
  Filled 2014-11-29: qty 8

## 2014-11-29 MED ORDER — DIPHENHYDRAMINE HCL 50 MG/ML IJ SOLN
INTRAMUSCULAR | Status: AC
  Filled 2014-11-29: qty 1

## 2014-11-29 MED ORDER — DEXTROSE 5 % IV SOLN
65.0000 mg/m2 | Freq: Once | INTRAVENOUS | Status: AC
  Administered 2014-11-29: 110 mg via INTRAVENOUS
  Filled 2014-11-29: qty 11

## 2014-11-29 MED ORDER — SODIUM CHLORIDE 0.9 % IV SOLN: Freq: Once | INTRAVENOUS | Status: DC

## 2014-11-29 MED ORDER — GABAPENTIN 600 MG PO TABS: 600.0000 mg | ORAL_TABLET | Freq: Three times a day (TID) | ORAL | Status: DC

## 2014-11-29 MED ORDER — OXYCODONE HCL 30 MG PO TABS: ORAL_TABLET | ORAL | Status: DC

## 2014-11-29 MED ORDER — CETUXIMAB CHEMO IV INJECTION 200 MG/100ML
250.0000 mg/m2 | Freq: Once | INTRAVENOUS | Status: AC
  Administered 2014-11-29: 400 mg via INTRAVENOUS
  Filled 2014-11-29: qty 200

## 2014-11-29 MED ORDER — SODIUM CHLORIDE 0.9 % IJ SOLN
10.0000 mL | INTRAMUSCULAR | Status: DC | PRN
  Filled 2014-11-29: qty 10

## 2014-11-29 MED ORDER — SODIUM CHLORIDE 0.9 % IV SOLN
510.0000 mg | Freq: Once | INTRAVENOUS | Status: AC
  Administered 2014-11-29: 510 mg via INTRAVENOUS
  Filled 2014-11-29: qty 17

## 2014-11-29 MED ORDER — FENTANYL 75 MCG/HR TD PT72: 150.0000 ug | MEDICATED_PATCH | TRANSDERMAL | Status: DC

## 2014-11-29 MED ORDER — SODIUM CHLORIDE 0.9 % IV SOLN
550.0000 mg | Freq: Once | INTRAVENOUS | Status: AC
  Administered 2014-11-29: 550 mg via INTRAVENOUS
  Filled 2014-11-29: qty 55

## 2014-11-29 NOTE — Telephone Encounter (Signed)
Okay to treat using 6/27 CMET per Dr. Marin Olp (today's not yet resulted)

## 2014-11-29 NOTE — Patient Instructions (Signed)
[facility] Discharge Instructions for Patients Receiving Chemotherapy  Today you received the following chemotherapy agents Erbitux, Carboplatin, Taxotere  To help prevent nausea and vomiting after your treatment, we encourage you to take your nausea medication  1) Zofran (Ondansetron) 8 mg.  Take 1 tablet in the morning and one in the evening beginning day after chemotherapy.  Take for 3 days.  2) Decadron - Continue taking Decadron for 3 days after chemotherapy.  3) Compazine (Prochlorperazine) Take 1 tablet by mouth every 6 hours AS NEEDED for nausea.     If you develop nausea and vomiting that is not controlled by your nausea medication, call the clinic.   BELOW ARE SYMPTOMS THAT SHOULD BE REPORTED IMMEDIATELY:  *FEVER GREATER THAN 100.5 F  *CHILLS WITH OR WITHOUT FEVER  NAUSEA AND VOMITING THAT IS NOT CONTROLLED WITH YOUR NAUSEA MEDICATION  *UNUSUAL SHORTNESS OF BREATH  *UNUSUAL BRUISING OR BLEEDING  TENDERNESS IN MOUTH AND THROAT WITH OR WITHOUT PRESENCE OF ULCERS  *URINARY PROBLEMS  *BOWEL PROBLEMS  UNUSUAL RASH Items with * indicate a potential emergency and should be followed up as soon as possible.  Feel free to call the clinic you have any questions or concerns. The clinic phone number is [phone].  Please show the [facility] at check-in to the Emergency Department and triage nurse.      Cetuximab injection What is this medicine? CETUXIMAB (se TUX i mab) is a chemotherapy drug. It targets a specific protein within cancer cells and stops the cells from growing. It is used to treat colorectal cancer and head and neck cancer. This medicine may be used for other purposes; ask your health care provider or pharmacist if you have questions. COMMON BRAND NAME(S): Erbitux What should I tell my health care provider before I take this medicine? They need to know if you have any of these conditions: -heart disease -history of irregular  heartbeat -history of low levels of calcium, magnesium, or potassium in the blood -lung or breathing disease, like asthma -an unusual or allergic reaction to cetuximab, other medicines, foods, dyes, or preservatives -pregnant or trying to get pregnant -breast-feeding How should I use this medicine? This drug is given as an infusion into a vein. It is administered in a hospital or clinic by a specially trained health care professional. Talk to your pediatrician regarding the use of this medicine in children. Special care may be needed. Overdosage: If you think you have taken too much of this medicine contact a poison control center or emergency room at once. NOTE: This medicine is only for you. Do not share this medicine with others. What if I miss a dose? It is important not to miss your dose. Call your doctor or health care professional if you are unable to keep an appointment. What may interact with this medicine? Interactions are not expected. This list may not describe all possible interactions. Give your health care provider a list of all the medicines, herbs, non-prescription drugs, or dietary supplements you use. Also tell them if you smoke, drink alcohol, or use illegal drugs. Some items may interact with your medicine. What should I watch for while using this medicine? Visit your doctor or health care professional for regular checks on your progress. This drug may make you feel generally unwell. This is not uncommon, as chemotherapy can affect healthy cells as well as cancer cells. Report any side effects. Continue your course of treatment even though you feel ill unless your  doctor tells you to stop. This medicine can make you more sensitive to the sun. Keep out of the sun while taking this medicine and for 2 months after the last dose. If you cannot avoid being in the sun, wear protective clothing and use sunscreen. Do not use sun lamps or tanning beds/booths. You may need blood work  done while you are taking this medicine. In some cases, you may be given additional medicines to help with side effects. Follow all directions for their use. Call your doctor or health care professional for advice if you get a fever, chills or sore throat, or other symptoms of a cold or flu. Do not treat yourself. This drug decreases your body's ability to fight infections. Try to avoid being around people who are sick. Avoid taking products that contain aspirin, acetaminophen, ibuprofen, naproxen, or ketoprofen unless instructed by your doctor. These medicines may hide a fever. Do not become pregnant while taking this medicine. Women should inform their doctor if they wish to become pregnant or think they might be pregnant. There is a potential for serious side effects to an unborn child. Use adequate birth control methods. Avoid pregnancy for at least 6 months after your last dose. Talk to your health care professional or pharmacist for more information. Do not breast-feed an infant while taking this medicine or during the 2 months after your last dose. What side effects may I notice from receiving this medicine? Side effects that you should report to your doctor or health care professional as soon as possible: -allergic reactions like skin rash, itching or hives, swelling of the face, lips, or tongue -breathing problems -changes in vision -fast, irregular heartbeat -feeling faint or lightheaded, falls -fever, chills -mouth sores -redness, blistering, peeling or loosening of the skin, including inside the mouth -trouble passing urine or change in the amount of urine -unusually weak or tired Side effects that usually do not require medical attention (report to your doctor or health care professional if they continue or are bothersome): -changes in skin like acne, cracks, skin dryness -constipation -diarrhea -headache -nail changes -nausea, vomiting -stomach upset -weight loss This list may  not describe all possible side effects. Call your doctor for medical advice about side effects. You may report side effects to FDA at 1-800-FDA-1088. Where should I keep my medicine? This drug is given in a hospital or clinic and will not be stored at home. NOTE: This sheet is a summary. It may not cover all possible information. If you have questions about this medicine, talk to your doctor, pharmacist, or health care provider.  2015, [employer]. (2013-08-12 16:14:34)  Carboplatin injection What is this medicine? CARBOPLATIN (KAR boe pla tin) is a chemotherapy drug. It targets fast dividing cells, like cancer cells, and causes these cells to die. This medicine is used to treat ovarian cancer and many other cancers. This medicine may be used for other purposes; ask your health care provider or pharmacist if you have questions. COMMON BRAND NAME(S): Paraplatin What should I tell my health care provider before I take this medicine? They need to know if you have any of these conditions: -blood disorders -hearing problems -kidney disease -recent or ongoing radiation therapy -an unusual or allergic reaction to carboplatin, cisplatin, other chemotherapy, other medicines, foods, dyes, or preservatives -pregnant or trying to get pregnant -breast-feeding How should I use this medicine? This drug is usually given as an infusion into a vein. It is administered in a hospital or clinic by  a specially trained health care professional. Talk to your pediatrician regarding the use of this medicine in children. Special care may be needed. Overdosage: If you think you have taken too much of this medicine contact a poison control center or emergency room at once. NOTE: This medicine is only for you. Do not share this medicine with others. What if I miss a dose? It is important not to miss a dose. Call your doctor or health care professional if you are unable to keep an appointment. What may  interact with this medicine? -medicines for seizures -medicines to increase blood counts like filgrastim, pegfilgrastim, sargramostim -some antibiotics like amikacin, gentamicin, neomycin, streptomycin, tobramycin -vaccines Talk to your doctor or health care professional before taking any of these medicines: -acetaminophen -aspirin -ibuprofen -ketoprofen -naproxen This list may not describe all possible interactions. Give your health care provider a list of all the medicines, herbs, non-prescription drugs, or dietary supplements you use. Also tell them if you smoke, drink alcohol, or use illegal drugs. Some items may interact with your medicine. What should I watch for while using this medicine? Your condition will be monitored carefully while you are receiving this medicine. You will need important blood work done while you are taking this medicine. This drug may make you feel generally unwell. This is not uncommon, as chemotherapy can affect healthy cells as well as cancer cells. Report any side effects. Continue your course of treatment even though you feel ill unless your doctor tells you to stop. In some cases, you may be given additional medicines to help with side effects. Follow all directions for their use. Call your doctor or health care professional for advice if you get a fever, chills or sore throat, or other symptoms of a cold or flu. Do not treat yourself. This drug decreases your body's ability to fight infections. Try to avoid being around people who are sick. This medicine may increase your risk to bruise or bleed. Call your doctor or health care professional if you notice any unusual bleeding. Be careful brushing and flossing your teeth or using a toothpick because you may get an infection or bleed more easily. If you have any dental work done, tell your dentist you are receiving this medicine. Avoid taking products that contain aspirin, acetaminophen, ibuprofen, naproxen, or  ketoprofen unless instructed by your doctor. These medicines may hide a fever. Do not become pregnant while taking this medicine. Women should inform their doctor if they wish to become pregnant or think they might be pregnant. There is a potential for serious side effects to an unborn child. Talk to your health care professional or pharmacist for more information. Do not breast-feed an infant while taking this medicine. What side effects may I notice from receiving this medicine? Side effects that you should report to your doctor or health care professional as soon as possible: -allergic reactions like skin rash, itching or hives, swelling of the face, lips, or tongue -signs of infection - fever or chills, cough, sore throat, pain or difficulty passing urine -signs of decreased platelets or bleeding - bruising, pinpoint red spots on the skin, black, tarry stools, nosebleeds -signs of decreased red blood cells - unusually weak or tired, fainting spells, lightheadedness -breathing problems -changes in hearing -changes in vision -chest pain -high blood pressure -low blood counts - This drug may decrease the number of white blood cells, red blood cells and platelets. You may be at increased risk for infections and bleeding. -nausea and vomiting -  pain, swelling, redness or irritation at the injection site -pain, tingling, numbness in the hands or feet -problems with balance, talking, walking -trouble passing urine or change in the amount of urine Side effects that usually do not require medical attention (report to your doctor or health care professional if they continue or are bothersome): -hair loss -loss of appetite -metallic taste in the mouth or changes in taste This list may not describe all possible side effects. Call your doctor for medical advice about side effects. You may report side effects to FDA at 1-800-FDA-1088. Where should I keep my medicine? This drug is given in a hospital or  clinic and will not be stored at home. NOTE: This sheet is a summary. It may not cover all possible information. If you have questions about this medicine, talk to your doctor, pharmacist, or health care provider.  2015, [employer]. (2007-08-05 14:38:05)   Docetaxel injection What is this medicine? DOCETAXEL (doe se TAX el) is a chemotherapy drug. It targets fast dividing cells, like cancer cells, and causes these cells to die. This medicine is used to treat many types of cancers like breast cancer, certain stomach cancers, head and neck cancer, lung cancer, and prostate cancer. This medicine may be used for other purposes; ask your health care provider or pharmacist if you have questions. COMMON BRAND NAME(S): Docefrez, Taxotere What should I tell my health care provider before I take this medicine? They need to know if you have any of these conditions: -infection (especially a virus infection such as chickenpox, cold sores, or herpes) -liver disease -low blood counts, like low white cell, platelet, or red cell counts -an unusual or allergic reaction to docetaxel, polysorbate 80, other chemotherapy agents, other medicines, foods, dyes, or preservatives -pregnant or trying to get pregnant -breast-feeding How should I use this medicine? This drug is given as an infusion into a vein. It is administered in a hospital or clinic by a specially trained health care professional. Talk to your pediatrician regarding the use of this medicine in children. Special care may be needed. Overdosage: If you think you have taken too much of this medicine contact a poison control center or emergency room at once. NOTE: This medicine is only for you. Do not share this medicine with others. What if I miss a dose? It is important not to miss your dose. Call your doctor or health care professional if you are unable to keep an appointment. What may interact with this  medicine? -cyclosporine -erythromycin -ketoconazole -medicines to increase blood counts like filgrastim, pegfilgrastim, sargramostim -vaccines Talk to your doctor or health care professional before taking any of these medicines: -acetaminophen -aspirin -ibuprofen -ketoprofen -naproxen This list may not describe all possible interactions. Give your health care provider a list of all the medicines, herbs, non-prescription drugs, or dietary supplements you use. Also tell them if you smoke, drink alcohol, or use illegal drugs. Some items may interact with your medicine. What should I watch for while using this medicine? Your condition will be monitored carefully while you are receiving this medicine. You will need important blood work done while you are taking this medicine. This drug may make you feel generally unwell. This is not uncommon, as chemotherapy can affect healthy cells as well as cancer cells. Report any side effects. Continue your course of treatment even though you feel ill unless your doctor tells you to stop. In some cases, you may be given additional medicines to help with side effects. Follow  all directions for their use. Call your doctor or health care professional for advice if you get a fever, chills or sore throat, or other symptoms of a cold or flu. Do not treat yourself. This drug decreases your body's ability to fight infections. Try to avoid being around people who are sick. This medicine may increase your risk to bruise or bleed. Call your doctor or health care professional if you notice any unusual bleeding. Be careful brushing and flossing your teeth or using a toothpick because you may get an infection or bleed more easily. If you have any dental work done, tell your dentist you are receiving this medicine. Avoid taking products that contain aspirin, acetaminophen, ibuprofen, naproxen, or ketoprofen unless instructed by your doctor. These medicines may hide a  fever. This medicine contains an alcohol in the product. You may get drowsy or dizzy. Do not drive, use machinery, or do anything that needs mental alertness until you know how this medicine affects you. Do not stand or sit up quickly, especially if you are an older patient. This reduces the risk of dizzy or fainting spells. Avoid alcoholic drinks Do not become pregnant while taking this medicine. Women should inform their doctor if they wish to become pregnant or think they might be pregnant. There is a potential for serious side effects to an unborn child. Talk to your health care professional or pharmacist for more information. Do not breast-feed an infant while taking this medicine. What side effects may I notice from receiving this medicine? Side effects that you should report to your doctor or health care professional as soon as possible: -allergic reactions like skin rash, itching or hives, swelling of the face, lips, or tongue -low blood counts - This drug may decrease the number of white blood cells, red blood cells and platelets. You may be at increased risk for infections and bleeding. -signs of infection - fever or chills, cough, sore throat, pain or difficulty passing urine -signs of decreased platelets or bleeding - bruising, pinpoint red spots on the skin, black, tarry stools, nosebleeds -signs of decreased red blood cells - unusually weak or tired, fainting spells, lightheadedness -breathing problems -fast or irregular heartbeat -low blood pressure -mouth sores -nausea and vomiting -pain, swelling, redness or irritation at the injection site -pain, tingling, numbness in the hands or feet -swelling of the ankle, feet, hands -weight gain Side effects that usually do not require medical attention (report to your prescriber or health care professional if they continue or are bothersome): -bone pain -complete hair loss including hair on your head, underarms, pubic hair, eyebrows, and  eyelashes -diarrhea -excessive tearing -changes in the color of fingernails -loosening of the fingernails -nausea -muscle pain -red flush to skin -sweating -weak or tired This list may not describe all possible side effects. Call your doctor for medical advice about side effects. You may report side effects to FDA at 1-800-FDA-1088. Where should I keep my medicine? This drug is given in a hospital or clinic and will not be stored at home. NOTE: This sheet is a summary. It may not cover all possible information. If you have questions about this medicine, talk to your doctor, pharmacist, or health care provider.  2015, [employer]. (2013-03-26 22:21:02)

## 2014-11-29 NOTE — Progress Notes (Signed)
Hematology and Oncology Follow Up Visit  Derek Campos [id number] 11-29-1970 44 y.o. 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 He is looking a lot better. His pain is much less now. He is really not had any flareups of pain.   He was hospitalized a couple weeks ago. He has some aspiration. He improved with IV fluids and antibiotics.  We did an MRI of his lumbar spine. This did show a lytic/sclerotic lesion at L5. This measured 2.1 x 1.9 x 1.8 cm. His heart assay this had always been there or not. Again, does not complain of any pain in that area.  He is still using his feeding tube. He is trying to swallow more.  He's had no leg swelling. He's had no fever. He's had no rashes.  Overall, his performance status is ECOG 2.   Medications:  Current outpatient prescriptions:  .  dexamethasone (DECADRON) 4 MG tablet, Take 2 tablets (8 mg total) by mouth 2 (two) times daily. Start the day before Taxotere. Then again the day after chemo for 3 days., Disp: 30 tablet, Rfl: 1 .  fentaNYL (DURAGESIC - DOSED MCG/HR) 75 MCG/HR, Place 2 patches (150 mcg total) onto the skin every 3 (three) days., Disp: 20 patch, Rfl: 0 .  gabapentin (NEURONTIN) 600 MG tablet, Take 600 mg by mouth., Disp: , Rfl:  .  ondansetron (ZOFRAN) 8 MG tablet, Take 1 tablet (8 mg total) by mouth 2 (two) times daily. Start the day after chemo for 3 days. Then take as needed for nausea or vomiting., Disp: 30 tablet, Rfl: 1 .  oxycodone (ROXICODONE) 30 MG immediate release tablet, Take 1-1 1/2 tablets, if needed, every 4 hrs for pain, Disp: 180 tablet, Rfl: 0 .  prochlorperazine (COMPAZINE) 10 MG tablet, Take 1 tablet (10 mg total) by mouth every 6 (six)  hours as needed (Nausea or vomiting)., Disp: 30 tablet, Rfl: 1 .  ciprofloxacin (CIPRO) 500 MG tablet, Take 1 tablet (500 mg total) by mouth 2 (two) times daily. (Patient not taking: Reported on 11/29/2014), Disp: 28 tablet, Rfl: 0 .  clindamycin (CLEOCIN) 300 MG capsule, Take 1 capsule (300 mg total) by mouth 3 (three) times daily. (Patient not taking: Reported on 11/29/2014), Disp: 36 capsule, Rfl: 0 No current facility-administered medications for this visit.  Facility-Administered Medications Ordered in Other Visits:  .  CARBOplatin (PARAPLATIN) 550 mg in sodium chloride 0.9 % 250 mL chemo infusion, 550 mg, Intravenous, Once, Volanda Napoleon, MD .  DOCEtaxel (TAXOTERE) 110 mg in dextrose 5 % 250 mL chemo infusion, 65 mg/m2 (Treatment Plan Actual), Intravenous, Once, Volanda Napoleon, MD, Last Rate: 261 mL/hr at 11/29/14 1154, 110 mg at 11/29/14 1154 .  heparin lock flush 100 unit/mL, 500 Units, Intracatheter, Once PRN, Volanda Napoleon, MD .  pegfilgrastim (NEULASTA ONPRO KIT) injection 6 mg, 6 mg, Subcutaneous, Once, Volanda Napoleon, MD .  sodium chloride 0.9 % injection 10 mL, 10 mL, Intracatheter, PRN, Volanda Napoleon, MD  Allergies:  Allergies  Allergen Reactions  . Other     Cat Gut Stitches - unknown reaction.Told by parents.    Past Medical History, Surgical history, Social history, and Family History were reviewed and updated.  Review of Systems: As above  Physical Exam:  weight is 120 lb (54.432 kg). His temperature is 97.4 F (36.3 C). His blood pressure is 91/51 and his pulse is 62. His respiration is 16.   Wt Readings from Last 3 Encounters:  11/29/14 120 lb (54.432 kg)  11/25/14 119 lb (53.978 kg)  11/15/14 120 lb 1 oz (54.46 kg)     Thin white gentleman. He is in no obvious distress. Head and neck exam shows his tongue to be significantly less inflamed and disfigured. There is not as much obvious tumor that is noted on the tongue. . Part of his tongue is missing  because of cancer. Surprisingly there is no adenopathy in his neck. Pupils react appropriately. Lungs are clear. Cardiac exam regular rate and rhythm with no murmurs, rubs or bruits. Abdomen is soft. Has a feeding tube as intact. There is no fluid wave. There is no palpable liver or spleen tip. Extremities shows no clubbing, cyanosis or edema. He has good strength in his extremities. He has good range of motion of his joints. Skin exam shows no rashes, ecchymoses or petechia. Neurological exam is nonfocal.  Lab Results  Component Value Date   WBC 7.5 11/29/2014   HGB 10.2* 11/29/2014   HCT 30.9* 11/29/2014   MCV 93 11/29/2014   PLT 157 11/29/2014     Chemistry      Component Value Date/Time   NA 143 11/29/2014 0816   NA 135 11/15/2014 2209   NA 137 10/25/2014 1018   K 4.4 11/29/2014 0816   K 4.2 11/15/2014 2209   K 4.1 10/25/2014 1018   CL 94* 11/29/2014 0816   CL 92* 11/15/2014 2209   CO2 33 11/29/2014 0816   CO2 32 11/15/2014 2209   CO2 26 10/25/2014 1018   BUN 31* 11/29/2014 0816   BUN 23* 11/15/2014 2209   BUN 15.3 10/25/2014 1018   CREATININE 1.0 11/29/2014 0816   CREATININE 0.74 11/15/2014 2209   CREATININE 0.7 10/25/2014 1018      Component Value Date/Time   CALCIUM 10.2 11/29/2014 0816   CALCIUM 9.3 11/15/2014 2209   CALCIUM 9.3 10/25/2014 1018   ALKPHOS 85* 11/29/2014 0816   ALKPHOS 79 11/15/2014 2209   ALKPHOS 74 10/25/2014 1018   AST 31 11/29/2014 0816   AST 21 11/15/2014 2209   AST 16 10/25/2014 1018   ALT 22 11/29/2014 0816   ALT 15* 11/15/2014 2209   ALT 12 10/25/2014 1018   BILITOT 0.60 11/29/2014 0816   BILITOT 0.2* 11/15/2014 2209   BILITOT 0.20 10/25/2014 1018         Impression and Plan: Derek Campos is a 44 year old white gentleman with metastatic head and neck cancer.  Again, I have to believe that he is responding to treatment. Again, I don't know if this lesion had always been there. He had a PET scan done back in April which does not  comment on any spine lesions. However, I have to believe that this has been present.  Clinically, he is doing much better. As such, I think that the treatment is helping and he sees be tolerating it pretty well.  His iron levels are low. His iron saturation is only 9%. Again, I think IV iron will help him. He really cannot take anything oral through his feeding tube.   We will go ahead and get him treated this week.I will try for 2 more cycles of therapy after this one and then re-scan him.  I spent about 25-30 minutes with him today.   Volanda Napoleon, MD 7/18/201611:58 AM

## 2014-11-29 NOTE — Telephone Encounter (Signed)
I spoke w Derek Campos w [employer] today and she advised, since the pt now has Medicaid insurance they are unable to help him w Erbitux, unless the claims are denied by medicaid.  At that point, we will have to send the insurance info & claim w the denial reason(s) to them for review.   Case: [id number]    P: [phone] #1

## 2014-12-01 ENCOUNTER — Encounter: Payer: Self-pay | Admitting: Internal Medicine

## 2014-12-01 NOTE — Assessment & Plan Note (Signed)

## 2014-12-01 NOTE — Assessment & Plan Note (Addendum)
Complicated by cavity formation RUL posteriorly c/w necrotizing pna > BAL 11/01/14 grew pseudomonas sens to cipro rx x 14 days started 11/08/14   He still has abx left in bottle which doesn't add up to consistent rx > emphasized finish abx and call if cough gets purulent or fever

## 2014-12-01 NOTE — Assessment & Plan Note (Addendum)
Fob 11/01/14  Neg bx/ cyt/ pseudomonas on culture  sens to cipro rx 11/08/14 with cipro x 14 days   See comments re HCAP/ since this is the result of lung abscess likely will take months to completely resolve radiographically   I had an extended final summary discussion with the patient reviewing all relevant studies completed to date and  lasting 15 to 20 minutes of a 25 minute visit on the following issues:    Each maintenance medication was reviewed in detail including most importantly the difference between maintenance and as needed and under what circumstances the prns are to be used.  Please see instructions for details which were reviewed in writing and the patient given a copy.

## 2014-12-02 ENCOUNTER — Encounter (HOSPITAL_COMMUNITY): Payer: Self-pay

## 2014-12-02 ENCOUNTER — Other Ambulatory Visit: Payer: Self-pay

## 2014-12-02 ENCOUNTER — Inpatient Hospital Stay (HOSPITAL_COMMUNITY)
Admission: EM | Admit: 2014-12-02 | Discharge: 2014-12-03 | DRG: 392 | Disposition: A | Discharge status: Home / Self Care | Payer: Medicaid Other | Attending: Family Medicine | Admitting: Family Medicine

## 2014-12-02 DIAGNOSIS — Z79899 Other long term (current) drug therapy: Secondary | ICD-10-CM

## 2014-12-02 DIAGNOSIS — Z79891 Long term (current) use of opiate analgesic: Secondary | ICD-10-CM

## 2014-12-02 DIAGNOSIS — Z923 Personal history of irradiation: Secondary | ICD-10-CM

## 2014-12-02 DIAGNOSIS — K1379 Other lesions of oral mucosa: Secondary | ICD-10-CM

## 2014-12-02 DIAGNOSIS — C770 Secondary and unspecified malignant neoplasm of lymph nodes of head, face and neck: Secondary | ICD-10-CM | POA: Diagnosis present

## 2014-12-02 DIAGNOSIS — C7800 Secondary malignant neoplasm of unspecified lung: Secondary | ICD-10-CM | POA: Diagnosis present

## 2014-12-02 DIAGNOSIS — E440 Moderate protein-calorie malnutrition: Secondary | ICD-10-CM | POA: Diagnosis present

## 2014-12-02 DIAGNOSIS — Z931 Gastrostomy status: Secondary | ICD-10-CM

## 2014-12-02 DIAGNOSIS — F17210 Nicotine dependence, cigarettes, uncomplicated: Secondary | ICD-10-CM | POA: Diagnosis present

## 2014-12-02 DIAGNOSIS — Z681 Body mass index (BMI) 19 or less, adult: Secondary | ICD-10-CM | POA: Diagnosis not present

## 2014-12-02 DIAGNOSIS — E860 Dehydration: Secondary | ICD-10-CM | POA: Diagnosis present

## 2014-12-02 DIAGNOSIS — R1311 Dysphagia, oral phase: Secondary | ICD-10-CM | POA: Diagnosis present

## 2014-12-02 DIAGNOSIS — G894 Chronic pain syndrome: Secondary | ICD-10-CM | POA: Diagnosis present

## 2014-12-02 DIAGNOSIS — I82210 Acute embolism and thrombosis of superior vena cava: Secondary | ICD-10-CM | POA: Diagnosis present

## 2014-12-02 DIAGNOSIS — Z9889 Other specified postprocedural states: Secondary | ICD-10-CM | POA: Diagnosis not present

## 2014-12-02 DIAGNOSIS — D729 Disorder of white blood cells, unspecified: Secondary | ICD-10-CM | POA: Diagnosis not present

## 2014-12-02 DIAGNOSIS — D509 Iron deficiency anemia, unspecified: Secondary | ICD-10-CM | POA: Diagnosis present

## 2014-12-02 DIAGNOSIS — C029 Malignant neoplasm of tongue, unspecified: Secondary | ICD-10-CM | POA: Diagnosis present

## 2014-12-02 DIAGNOSIS — R42 Dizziness and giddiness: Secondary | ICD-10-CM

## 2014-12-02 DIAGNOSIS — R1110 Vomiting, unspecified: Secondary | ICD-10-CM | POA: Diagnosis not present

## 2014-12-02 DIAGNOSIS — B370 Candidal stomatitis: Secondary | ICD-10-CM | POA: Insufficient documentation

## 2014-12-02 DIAGNOSIS — R112 Nausea with vomiting, unspecified: Secondary | ICD-10-CM | POA: Diagnosis present

## 2014-12-02 DIAGNOSIS — T451X5A Adverse effect of antineoplastic and immunosuppressive drugs, initial encounter: Secondary | ICD-10-CM | POA: Diagnosis present

## 2014-12-02 DIAGNOSIS — Z888 Allergy status to other drugs, medicaments and biological substances status: Secondary | ICD-10-CM

## 2014-12-02 LAB — CBC WITH DIFFERENTIAL/PLATELET
Basophils Absolute: 0 10*3/uL (ref 0.0–0.1)
Basophils Relative: 0 % (ref 0–1)
EOS PCT: 0 % (ref 0–5)
Eosinophils Absolute: 0 10*3/uL (ref 0.0–0.7)
HCT: 40.1 % (ref 39.0–52.0)
HEMOGLOBIN: 13.6 g/dL (ref 13.0–17.0)
LYMPHS ABS: 0.7 10*3/uL (ref 0.7–4.0)
LYMPHS PCT: 2 % — AB (ref 12–46)
MCH: 30.8 pg (ref 26.0–34.0)
MCHC: 33.9 g/dL (ref 30.0–36.0)
MCV: 90.7 fL (ref 78.0–100.0)
MONO ABS: 0.3 10*3/uL (ref 0.1–1.0)
Monocytes Relative: 1 % — ABNORMAL LOW (ref 3–12)
Neutro Abs: 33 10*3/uL — ABNORMAL HIGH (ref 1.7–7.7)
Neutrophils Relative %: 97 % — ABNORMAL HIGH (ref 43–77)
Platelets: 143 10*3/uL — ABNORMAL LOW (ref 150–400)
RBC: 4.42 MIL/uL (ref 4.22–5.81)
RDW: 18.9 % — ABNORMAL HIGH (ref 11.5–15.5)
WBC: 34 10*3/uL — AB (ref 4.0–10.5)

## 2014-12-02 LAB — COMPREHENSIVE METABOLIC PANEL
ALT: 18 U/L (ref 17–63)
ANION GAP: 13 (ref 5–15)
AST: 29 U/L (ref 15–41)
Albumin: 4.2 g/dL (ref 3.5–5.0)
Alkaline Phosphatase: 110 U/L (ref 38–126)
BUN: 25 mg/dL — ABNORMAL HIGH (ref 6–20)
CALCIUM: 10.4 mg/dL — AB (ref 8.9–10.3)
CO2: 33 mmol/L — ABNORMAL HIGH (ref 22–32)
Chloride: 93 mmol/L — ABNORMAL LOW (ref 101–111)
Creatinine, Ser: 1.05 mg/dL (ref 0.61–1.24)
GFR calc Af Amer: >60 mL/min (ref >60)
GFR calc non Af Amer: >60 mL/min (ref >60)
GLUCOSE: 186 mg/dL — AB (ref 65–99)
Potassium: 4.2 mmol/L (ref 3.5–5.1)
SODIUM: 139 mmol/L (ref 135–145)
TOTAL PROTEIN: 8.6 g/dL — AB (ref 6.5–8.1)
Total Bilirubin: 0.9 mg/dL (ref 0.3–1.2)

## 2014-12-02 LAB — LIPASE, BLOOD: LIPASE: 15 U/L — AB (ref 22–51)

## 2014-12-02 MED ORDER — MAGIC MOUTHWASH W/LIDOCAINE
10.0000 mL | Freq: Three times a day (TID) | ORAL | Status: DC
  Administered 2014-12-02 – 2014-12-03 (×3): 10 mL via ORAL
  Filled 2014-12-02 (×4): qty 10

## 2014-12-02 MED ORDER — SODIUM CHLORIDE 0.9 % IJ SOLN
3.0000 mL | Freq: Two times a day (BID) | INTRAMUSCULAR | Status: DC
  Administered 2014-12-02 – 2014-12-03 (×2): 3 mL via INTRAVENOUS

## 2014-12-02 MED ORDER — ONDANSETRON HCL 4 MG PO TABS: 4.0000 mg | ORAL_TABLET | Freq: Four times a day (QID) | ORAL | Status: DC | PRN

## 2014-12-02 MED ORDER — ONDANSETRON HCL 4 MG/2ML IJ SOLN
4.0000 mg | Freq: Four times a day (QID) | INTRAMUSCULAR | Status: DC | PRN
  Administered 2014-12-02 – 2014-12-03 (×2): 4 mg via INTRAVENOUS
  Filled 2014-12-02 (×2): qty 2

## 2014-12-02 MED ORDER — SODIUM CHLORIDE 0.9 % IV BOLUS (SEPSIS)
1000.0000 mL | Freq: Once | INTRAVENOUS | Status: AC
  Administered 2014-12-02: 1000 mL via INTRAVENOUS

## 2014-12-02 MED ORDER — PROCHLORPERAZINE EDISYLATE 5 MG/ML IJ SOLN
10.0000 mg | Freq: Once | INTRAMUSCULAR | Status: AC
  Administered 2014-12-02: 10 mg via INTRAVENOUS
  Filled 2014-12-02: qty 2

## 2014-12-02 MED ORDER — MORPHINE SULFATE 4 MG/ML IJ SOLN
4.0000 mg | Freq: Once | INTRAMUSCULAR | Status: DC
  Filled 2014-12-02: qty 1

## 2014-12-02 MED ORDER — GABAPENTIN 600 MG PO TABS: 600.0000 mg | ORAL_TABLET | Freq: Three times a day (TID) | ORAL | Status: DC

## 2014-12-02 MED ORDER — MAGIC MOUTHWASH W/LIDOCAINE
10.0000 mL | Freq: Once | ORAL | Status: AC
  Administered 2014-12-02: 10 mL via ORAL
  Filled 2014-12-02: qty 10

## 2014-12-02 MED ORDER — PANTOPRAZOLE SODIUM 40 MG IV SOLR
40.0000 mg | INTRAVENOUS | Status: DC
  Administered 2014-12-02: 40 mg via INTRAVENOUS
  Filled 2014-12-02 (×3): qty 40

## 2014-12-02 MED ORDER — MORPHINE SULFATE 2 MG/ML IJ SOLN
2.0000 mg | INTRAMUSCULAR | Status: DC | PRN
  Administered 2014-12-02 – 2014-12-03 (×4): 2 mg via INTRAVENOUS
  Filled 2014-12-02 (×4): qty 1

## 2014-12-02 MED ORDER — FENTANYL 50 MCG/HR TD PT72
150.0000 ug | MEDICATED_PATCH | TRANSDERMAL | Status: DC
  Administered 2014-12-02: 150 ug via TRANSDERMAL
  Filled 2014-12-02: qty 3

## 2014-12-02 MED ORDER — DEXTROSE-NACL 5-0.45 % IV SOLN
INTRAVENOUS | Status: DC
Start: 2014-12-02 — End: 2014-12-03
  Administered 2014-12-03: 03:00:00 via INTRAVENOUS

## 2014-12-02 MED ORDER — GABAPENTIN 300 MG PO CAPS
600.0000 mg | ORAL_CAPSULE | Freq: Three times a day (TID) | ORAL | Status: DC
  Filled 2014-12-02: qty 2

## 2014-12-02 MED ORDER — ONDANSETRON HCL 4 MG/2ML IJ SOLN
4.0000 mg | Freq: Once | INTRAMUSCULAR | Status: AC
  Administered 2014-12-02: 4 mg via INTRAVENOUS
  Filled 2014-12-02: qty 2

## 2014-12-02 MED ORDER — HEPARIN SODIUM (PORCINE) 5000 UNIT/ML IJ SOLN
5000.0000 [IU] | Freq: Three times a day (TID) | INTRAMUSCULAR | Status: DC
  Administered 2014-12-02 – 2014-12-03 (×3): 5000 [IU] via SUBCUTANEOUS
  Filled 2014-12-02 (×6): qty 1

## 2014-12-02 NOTE — ED Notes (Signed)
Pt placed in a gown and hooked up to the monitor with the BP cuff and pulse ox 

## 2014-12-02 NOTE — H&P (Signed)
[facility] Admission History and Physical Service Pager: [phone]  Patient name: Derek Campos Medical record number: [id number] Date of birth: [birth year] Age: 44 y.o. Gender: male  Primary Care Provider: No PCP Per Patient Consultants: None Code Status: Full  Chief Complaint: vomiting  Assessment and Plan: Derek Campos is a 44 y.o. male presenting with intractable nausea and vomiting after receiving chemotherapyy. PMH is significant for tongue SCC with metastasis and tobacco abuse  Intractable Nausea and Vomiting / Dehydration.  Nausea and vomiting likely secondary to emetogenic effects of recent chemotherapy. Vital signs normal, abdominal exam benign, doubt intra-abdominal process. Presentation not consistent with viral etiology. Will proceed with conservative management. No significant electrolyte disturbances.  - Zofran 4mg  every 6 hrs prn - Will give additional 1L normal saline now, then D5 1/2NS @ 150cc/hr - Will obtain EKG to check QTc - Consider increasing dose of Zofran or adding Zyprexa if symptoms not controlled and QTc not elongated.  - Will check orthostatic vitals - Will start PPI  Oral Thrush. Likely source of patient's mouth pain - Magic Mouth Wash - Consider trial of diflucan if not improving.   Oral SCC with metastasis. Mets to lungs noted on PET scan in April. - Hold home gabapentin while not tolerating PO - Morphine 2mg  IV q4hrs prn while not tolerating PO - Continue home fentanyl  Leukocytosis. WBC 34.0 on admission (up from 7.5 three days ago). Possibly due to recently receiving Neulasta and decadron with chemotherapy.  - Follow up AM CBC - Consider obtaining peripheral smear if continue to uptrend  Tobacco Abuse - Encourage smoking cessation  FEN/GI: NPO, ADAT, D5 1/2NS @ 150cc/hr Prophylaxis: SubQ heparin  Disposition: Admitted to telemetry under attending Dr Mingo Amber pending above work up.   History of  Present Illness: Derek Campos is a 44 y.o. male presenting with intractable nausea and vomiting after receiving his chemotherapy 3 days ago.  Patient reports that he has been vomiting every 2 hours for the past 2 days. He received chemotherapy on Monday at [facility]. Per chart review, received carboplatin, docetaxel, and neulasta. Patient also received 16mg  of Zofran and 20mg  of Decadron.  States that he also has intractable nausea and vomiting after receiving chemotherapy that lasts for "3 or 4 days." States that he has nothing has seemed to help. Patient has been unable to keep anything down. Reports that he has been throwing up his tube feedings. Patient also reports that he has noticed red streaks in his vomit recently. Denies melena or hematochezia.  Denies any fevers or chills. No diarrhea. No abdominal pain. No sick contacts. No chest pain. Endorses lightheadedness when standing. Also reports a burning pain in his mouth for the past 3-4 days.   In the ED, patient was given 2L total of NS, 4mg  of Zofran, and 10mg  of compazine without improvement in his symptoms.   Review Of Systems: Per HPI, otherwise 12 point review of systems was performed and was unremarkable.  Patient Active Problem List   Diagnosis Date Noted  . Intractable nausea and vomiting 12/02/2014  . Iron deficiency anemia 11/29/2014  . 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 Malnutrition of moderate degree 10/31/2014  . PEG (percutaneous endoscopic gastrostomy) adjustment/replacement/removal   . HCAP (healthcare-associated pneumonia) 10/30/2014  . Sepsis 10/30/2014  . Metastasis 10/30/2014  . Cavitary lesion of lung 10/30/2014  . Pancytopenia due to chemotherapy 10/30/2014  . 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 H/O irradiation, presenting hazards to  health 09/13/2014  . 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 Dysphagia, oral phase 09/13/2014  . Squamous cell cancer of tongue 09/13/2014  . Oral  cancer 08/11/2014  . S/P percutaneous endoscopic gastrostomy (PEG) tube placement 08/11/2014  . Chronic pain syndrome 08/11/2014  . Cigarette smoker 08/11/2014   Past Medical History: Past Medical History  Diagnosis Date  . 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 Cancer 2015    tongue  . Oral cancer   . Iron deficiency anemia 11/29/2014  . 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 J peg    . Sp perc place gastric tube    . Video bronchoscopy Bilateral 11/01/2014    Procedure: VIDEO BRONCHOSCOPY WITH FLUORO;  Surgeon: Rigoberto Noel, MD;  Location: [facility];  Service: Cardiopulmonary;  Laterality: Bilateral;   Social History: History  Substance Use Topics  . Smoking status: Current Some Day Smoker -- 1.00 packs/day for 31 years    Types: Cigarettes  . Smokeless tobacco: Never Used  . Alcohol Use: No   Please also refer to relevant sections of EMR.  Family History: Family History  Problem Relation Age of Onset  . Cancer Father   . Alcohol abuse Father    Allergies and Medications: Allergies  Allergen Reactions  . Other     Cat Gut Stitches - unknown reaction.Told by parents.   No current facility-administered medications on file prior to encounter.   Current Outpatient Prescriptions on File Prior to Encounter  Medication Sig Dispense Refill  . dexamethasone (DECADRON) 4 MG tablet Take 2 tablets (8 mg total) by mouth 2 (two) times daily. Start the day before Taxotere. Then again the day after chemo for 3 days. 30 tablet 1  . fentaNYL (DURAGESIC - DOSED MCG/HR) 75 MCG/HR Place 2 patches (150 mcg total) onto the skin every 3 (three) days. 20 patch 0  . gabapentin (NEURONTIN) 600 MG tablet Take 1 tablet (600 mg total) by mouth 3 (three) times daily. 90 tablet 4  . ondansetron (ZOFRAN) 8 MG tablet Take 1 tablet (8 mg total) by mouth 2 (two) times daily. Start the day after chemo for 3 days. Then  take as needed for nausea or vomiting. 30 tablet 1  . oxycodone (ROXICODONE) 30 MG immediate release tablet Take 1-1 1/2 tablets, if needed, every 4 hrs for pain 180 tablet 0  . prochlorperazine (COMPAZINE) 10 MG tablet Take 1 tablet (10 mg total) by mouth every 6 (six) hours as needed (Nausea or vomiting). 30 tablet 1    Objective: BP 106/80 mmHg  Pulse 88  Temp(Src) 98.2 F (36.8 C) (Oral)  Resp 18  Ht 5\' 11"  (1.803 m)  Wt 120 lb (54.432 kg)  BMI 16.74 kg/m2  SpO2 96% Exam: General: Chronically ill, emaciated appearing man lying in hospital bed Eyes: EOMI, PERRL ENTM: Tongue s/p partial resection with thick white plaque noted on mucosal surfaces, Dry appearing mucus membranes Neck: FROM, supple Cardiovascular: RRR, no murmurs appreciated Respiratory: NWOB, CTAB Abdomen: PEG tube in place with small amount of brown discharge leaking around tube, no signs of infection. NT, ND MSK: WWP, no cyanosis Skin: No rashes or skin lesions noted Neuro: Alert and conversational. Dysarthric speech secondary to partial tongue resection. Moves all extremities spontaneously Psych: Blunted affect. Normal thought content.   Labs and Imaging: CBC BMET   Recent Labs Lab 12/02/14 1504  WBC 34.0*  HGB 13.6  HCT 40.1  PLT 143*    Recent Labs Lab 12/02/14 1504  NA 139  K 4.2  CL 93*  CO2 33*  BUN 25*  CREATININE 1.05  GLUCOSE 186*  CALCIUM 10.4*     Caleb Burnetta Sabin, MD 12/02/2014, 6:09 PM PGY-2, [facility] pager: [phone], text pages welcome

## 2014-12-02 NOTE — ED Notes (Signed)
Attempted report 

## 2014-12-02 NOTE — Progress Notes (Addendum)
Patient trasfered from ED to [phone] via stretcher; alert and oriented x 4;  Complaining of throat pain ; IV saline locked in LAC - started IV fluids. Orient patient to room and unit; gave patient care guide; instructed how to use the call bell and  fall risk precautions. Will continue to monitor the patient.

## 2014-12-02 NOTE — ED Notes (Addendum)
Per PA hold pain medication at this time d/t BP-93/60, also aware pt vomitted x 1, PA aware

## 2014-12-02 NOTE — ED Provider Notes (Signed)
CSN: [id number]     Arrival date & time 12/02/14  1338 History   First MD Initiated Contact with Patient 12/02/14 1418     No chief complaint on file.    (Consider location/radiation/quality/duration/timing/severity/associated sxs/prior Treatment) HPI Comments: Derek Campos is a 44 y.o. 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 He reports approximately 10-12 episodes of emesis daily, with occasional red streaks but no large volume hematemesis, as well as nausea, and lightheadedness with standing. He reports his mouth pain is 10/10 constant burning in the mouth and throat, nonradiating, worse with vomiting, and with no treatments tried prior to arrival given the has not been able to tolerate any of his normal pain medicines. He states he has tried Phenergan at home for nausea but it has not helped. He is able to tolerate some liquids, but he is vomiting up his JPEG feedings. He denies any fevers, chills, chest pain, shortness breath, abdominal pain, diarrhea, constipation, melena, hematochezia, obstipation, dysuria, hematuria, numbness, tingling, weakness, suspicious food intake, sick contacts, recent travel, antibiotic use in the last 4 weeks, alcohol use, or NSAIDs. His oncologist is Dr. Marin Olp  Patient is a 44 y.o. male presenting with vomiting. The history is provided by the patient. No language interpreter was used.  Emesis Severity:  Moderate Duration:  3 days Timing:  Constant Number of daily episodes:  10-12x/day Quality:  Stomach contents (with occasional streaks of red) Able to tolerate:  Liquids Progression:  Unchanged Chronicity:  Recurrent Relieved by:  Nothing Worsened by:  Nothing tried Ineffective treatments:  Antiemetics Associated symptoms: sore throat   Associated symptoms: no abdominal pain, no  arthralgias, no chills, no diarrhea, no myalgias and no URI   Risk factors: no alcohol use, no sick contacts, no suspect food intake and no travel to endemic areas     Past Medical History  Diagnosis Date  . 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 Cancer 2015    tongue  . Oral cancer   . Iron deficiency anemia 11/29/2014  . 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 J peg    . Sp perc place gastric tube    . Video bronchoscopy Bilateral 11/01/2014    Procedure: VIDEO BRONCHOSCOPY WITH FLUORO;  Surgeon: Rigoberto Noel, MD;  Location: [facility];  Service: Cardiopulmonary;  Laterality: Bilateral;   Family History  Problem Relation Age of Onset  . Cancer Father   . Alcohol abuse Father    History  Substance Use Topics  . Smoking status: Current Some Day Smoker -- 1.00 packs/day for 31 years    Types: Cigarettes  . Smokeless tobacco: Never Used  . Alcohol Use: No    Review of Systems  Constitutional: Negative for fever and chills.  HENT: Positive for mouth sores and sore throat. Negative for drooling, rhinorrhea and trouble swallowing.   Respiratory: Negative for shortness of breath.   Cardiovascular: Negative for chest pain.  Gastrointestinal: Positive for nausea and vomiting. Negative for abdominal pain, diarrhea, constipation and blood in stool.       +Red streaks in emesis  Genitourinary: Negative for dysuria and hematuria.  Musculoskeletal: Negative for myalgias and arthralgias.  Skin: Negative for color change.  Allergic/Immunologic: Positive for immunocompromised state (on chemo).  Neurological: Positive  for light-headedness (with standing only). Negative for weakness and numbness.  Psychiatric/Behavioral: Negative for confusion.   10 Systems reviewed and are negative for acute change except as noted in the HPI.    Allergies  Other  Home Medications   Prior to Admission medications   Medication Sig Start  Date End Date Taking? Authorizing Provider  ciprofloxacin (CIPRO) 500 MG tablet Take 1 tablet (500 mg total) by mouth 2 (two) times daily. Patient not taking: Reported on 11/29/2014 11/08/14   Rigoberto Noel, MD  clindamycin (CLEOCIN) 300 MG capsule Take 1 capsule (300 mg total) by mouth 3 (three) times daily. Patient not taking: Reported on 11/29/2014 11/03/14   Florencia Reasons, MD  dexamethasone (DECADRON) 4 MG tablet Take 2 tablets (8 mg total) by mouth 2 (two) times daily. Start the day before Taxotere. Then again the day after chemo for 3 days. 09/27/14   Volanda Napoleon, MD  fentaNYL (DURAGESIC - DOSED MCG/HR) 75 MCG/HR Place 2 patches (150 mcg total) onto the skin every 3 (three) days. 11/29/14   Volanda Napoleon, MD  gabapentin (NEURONTIN) 600 MG tablet Take 1 tablet (600 mg total) by mouth 3 (three) times daily. 11/29/14   Volanda Napoleon, MD  ondansetron (ZOFRAN) 8 MG tablet Take 1 tablet (8 mg total) by mouth 2 (two) times daily. Start the day after chemo for 3 days. Then take as needed for nausea or vomiting. 09/27/14   Volanda Napoleon, MD  oxycodone (ROXICODONE) 30 MG immediate release tablet Take 1-1 1/2 tablets, if needed, every 4 hrs for pain 11/29/14   Volanda Napoleon, MD  prochlorperazine (COMPAZINE) 10 MG tablet Take 1 tablet (10 mg total) by mouth every 6 (six) hours as needed (Nausea or vomiting). 09/27/14   Volanda Napoleon, MD   BP 101/79 mmHg  Pulse 115  Temp(Src) 98.2 F (36.8 C) (Oral)  Resp 18  Ht 5\' 11"  (1.803 m)  Wt 120 lb (54.432 kg)  BMI 16.74 kg/m2  SpO2 99% Physical Exam  Constitutional: He is oriented to person, place, and time. He appears well-developed. He appears cachectic.  Non-toxic appearance. No distress.  Afebrile, nontoxic, thin and frail, cachectic, appears older than stated age, mildly tachycardic with +orthostasis during exam  HENT:  Head: Normocephalic and atraumatic.  Mouth/Throat: Uvula is midline. Mucous membranes are dry. Oral lesions present. No trismus in the  jaw. No uvula swelling. Posterior oropharyngeal erythema present.  Dry mucous membranes Tongue with large deficit from prior resections. Tongue and oropharynx coated with white patches which scrape off and reveal erythematous base, very tender to palpation. No tonsillar swelling or exudates, no uvular swelling or deviation.   Eyes: Conjunctivae and EOM are normal. Right eye exhibits no discharge. Left eye exhibits no discharge.  Neck: Normal range of motion. Neck supple.  Cardiovascular: Regular rhythm, normal heart sounds and intact distal pulses.  Tachycardia present.  Exam reveals no gallop and no friction rub.   No murmur heard. Tachycardic on exam, HR 101-115, +orthostatic changes with position changes, reg rhythm, nl s1/s2, no m/r/g, distal pulses intact  Pulmonary/Chest: Effort normal and breath sounds normal. No respiratory distress. He has no decreased breath sounds. He has no wheezes. He has no rhonchi. He has no rales.  Abdominal: Soft. Normal appearance and bowel sounds are normal. He exhibits no distension. There is no tenderness. There is no rigidity, no rebound, no guarding, no CVA tenderness, no tenderness at McBurney's point and negative Murphy's sign.  Soft,  NTND, +BS throughout, no r/g/r, neg murphy's, neg mcburney's, no CVA TTP. Jpeg in place near RUQ/periumbilical area, no surrounding erythema or warmth, no drainage.  Musculoskeletal: Normal range of motion.  Neurological: He is alert and oriented to person, place, and time. He has normal strength. No sensory deficit.  Skin: Skin is warm, dry and intact. No rash noted.  Psychiatric: He has a normal mood and affect.  Nursing note and vitals reviewed.   ED Course  Procedures (including critical care time) Labs Review Labs Reviewed  CBC WITH DIFFERENTIAL/PLATELET - Abnormal; Notable for the following:    WBC 34.0 (*)    RDW 18.9 (*)    Platelets 143 (*)    Neutrophils Relative % 97 (*)    Lymphocytes Relative 2 (*)     Monocytes Relative 1 (*)    Neutro Abs 33.0 (*)    All other components within normal limits  LIPASE, BLOOD - Abnormal; Notable for the following:    Lipase 15 (*)    All other components within normal limits  COMPREHENSIVE METABOLIC PANEL - Abnormal; Notable for the following:    Chloride 93 (*)    CO2 33 (*)    Glucose, Bld 186 (*)    BUN 25 (*)    Calcium 10.4 (*)    Total Protein 8.6 (*)    All other components within normal limits    Imaging Review No results found.   EKG Interpretation None      MDM   Final diagnoses:  Orthostatic lightheadedness  Mouth pain  Oral thrush  Intractable vomiting with nausea, vomiting of unspecified type  Neutrophilic leukocytosis    43 y.o. male here with n/v since chemo 3 days ago. Tachycardic, thin and frail, orthostatic on exam. Afebrile. No abdominal tenderness, Jpeg in place without surrounding erythema, no distension. Mouth/throat pain, exam revealing white patches that scrape and reveal erythematous base, likely thrush. Will get labs, give fluids, magic mouthwash, zofran, and some pain meds since he hasn't been able to keep down his chronic narcotics. Will reassess shortly.   4:10 PM CBC w/diff showing leukocytosis of 34.0 with mild L shift, pt received neulasta and gets decadron after chemo which likely caused this. Lipase WNL. CMP unchanged from prior, mildly low chloride, mildly elevated glucose, BUN 25. Pt with mildly low BP close to his baseline based on outpt readings. Currently receiving fluids which have improved his HR, given BP slightly low will hold morphine until it comes up slightly. Pt just vomited, will try compazine next. Will monitor.  5:10 PM Nausea improved, but upon attempting PO challenge pt vomited again. Given that we've tried multiple antiemetics, and pt is chronically ill individual who can't tolerate becoming more dehydrated and ill, will consult for admission. BP increased to 118/83 and HR improving, which  is reassuring.   5:26 PM Dimas Chyle MD of family medicine service returning page, will admit to their service. Please see his note for further documentation of care.  BP 119/81 mmHg  Pulse 96  Temp(Src) 98.2 F (36.8 C) (Oral)  Resp 18  Ht 5\' 11"  (1.803 m)  Wt 120 lb (54.432 kg)  BMI 16.74 kg/m2  SpO2 98%  Meds ordered this encounter  Medications  . sodium chloride 0.9 % bolus 1,000 mL    Sig:   . ondansetron (ZOFRAN) injection 4 mg    Sig:   . magic mouthwash w/lidocaine    Sig:   . morphine 4 MG/ML injection 4 mg  Sig:   . sodium chloride 0.9 % bolus 1,000 mL    Sig:   . prochlorperazine (COMPAZINE) injection 10 mg    SigZacarias Pontes, PA-C 12/02/14 1727  Orpah Greek, MD 12/03/14 1736

## 2014-12-02 NOTE — ED Notes (Signed)
Pt presents with onset of nausea, vomiting since chemo on Monday.  Pt reports he was seen recently for same.

## 2014-12-03 DIAGNOSIS — K1379 Other lesions of oral mucosa: Secondary | ICD-10-CM | POA: Insufficient documentation

## 2014-12-03 DIAGNOSIS — R1110 Vomiting, unspecified: Secondary | ICD-10-CM

## 2014-12-03 DIAGNOSIS — B370 Candidal stomatitis: Secondary | ICD-10-CM

## 2014-12-03 DIAGNOSIS — D729 Disorder of white blood cells, unspecified: Secondary | ICD-10-CM

## 2014-12-03 LAB — BASIC METABOLIC PANEL
Anion gap: 10 (ref 5–15)
BUN: 18 mg/dL (ref 6–20)
CALCIUM: 8.6 mg/dL — AB (ref 8.9–10.3)
CO2: 26 mmol/L (ref 22–32)
Chloride: 103 mmol/L (ref 101–111)
Creatinine, Ser: 0.83 mg/dL (ref 0.61–1.24)
GFR calc Af Amer: >60 mL/min (ref >60)
Glucose, Bld: 111 mg/dL — ABNORMAL HIGH (ref 65–99)
Potassium: 3.7 mmol/L (ref 3.5–5.1)
Sodium: 139 mmol/L (ref 135–145)

## 2014-12-03 LAB — CBC
HCT: 27.8 % — ABNORMAL LOW (ref 39.0–52.0)
Hemoglobin: 9.1 g/dL — ABNORMAL LOW (ref 13.0–17.0)
MCH: 30.5 pg (ref 26.0–34.0)
MCHC: 32.7 g/dL (ref 30.0–36.0)
MCV: 93.3 fL (ref 78.0–100.0)
Platelets: 112 10*3/uL — ABNORMAL LOW (ref 150–400)
RBC: 2.98 MIL/uL — AB (ref 4.22–5.81)
RDW: 19 % — ABNORMAL HIGH (ref 11.5–15.5)
WBC: 21 10*3/uL — AB (ref 4.0–10.5)

## 2014-12-03 MED ORDER — PANTOPRAZOLE SODIUM 40 MG PO TBEC
40.0000 mg | DELAYED_RELEASE_TABLET | Freq: Every day | ORAL | Status: DC
  Administered 2014-12-03: 40 mg via ORAL

## 2014-12-03 MED ORDER — PROMETHAZINE HCL 25 MG PO TABS: 12.5000 mg | ORAL_TABLET | Freq: Four times a day (QID) | ORAL | Status: DC | PRN

## 2014-12-03 NOTE — Discharge Instructions (Signed)
You were admitted for nausea and vomiting after receiving chemotherapy. You were given medication to help with the nausea and vomiting. Please come back to the hospital if you start to feel fever/chills or increased nausea/vomiting like you did upon admission.

## 2014-12-03 NOTE — Progress Notes (Addendum)
Initial Nutrition Assessment  DOCUMENTATION CODES:   Non-severe (moderate) malnutrition in context of chronic illness, Underweight  INTERVENTION:    Recommend Jevity 1.2 formula:  2 cans (474 ml) 4 times daily via J-tube  Total TF regimen to provide 2275 kcals, 105 gm protein, 1530 ml of free water  NUTRITION DIAGNOSIS:   Increased nutrient needs related to catabolic illness as evidenced by estimated needs  GOAL:   Patient will meet greater than or equal to 90% of their needs  MONITOR:   TF tolerance, Labs, Weight trends, I & O's  REASON FOR ASSESSMENT:   Malnutrition Screening Tool  ASSESSMENT:   44 y.o. Male presented with intractable nausea and vomiting after receiving chemotherapyy. PMH is significant for tongue SCC with metastasis and tobacco abuse.  Pt seen per Clinical Nutrition during previous hospital admission in June 2016.  Malnutrition identified and ongoing.  Patient reports he does not take food by mouth; only liquids due to throat/swallowing pain.  Home TF regimen is Jevity 1.2 formula 8 cans (1896 ml) per day via J-tube which provides 2275 kcals, 105 gm protein, 1530 ml of free water. Denies tolerance issues.  Reports weight stability.   Nutrition-Focused physical exam completed. Findings are moderate fat depletion, moderate muscle depletion, and no edema.   Diet Order:  Diet full liquid Room service appropriate?: Yes; Fluid consistency:: Thin  Skin:  Reviewed, no issues  Last BM:  7/21  Height:   Ht Readings from Last 1 Encounters:  12/02/14 5\' 11"  (1.803 m)    Weight:   Wt Readings from Last 1 Encounters:  12/02/14 120 lb (54.432 kg)    Ideal Body Weight:  78 kg  Wt Readings from Last 10 Encounters:  12/02/14 120 lb (54.432 kg)  11/29/14 120 lb (54.432 kg)  11/25/14 119 lb (53.978 kg)  11/15/14 120 lb 1 oz (54.46 kg)  11/08/14 120 lb (54.432 kg)  11/02/14 126 lb (57.153 kg)  10/12/14 120 lb (54.432 kg)  10/04/14 121 lb (54.885 kg)   09/22/14 122 lb (55.339 kg)  09/08/14 122 lb (55.339 kg)    BMI:  Body mass index is 16.74 kg/(m^2).  Estimated Nutritional Needs:   Kcal:  2100-2300  Protein:  105-115 gm  Fluid:  2.1-2.3 L  EDUCATION NEEDS:   No education needs identified at this time  Derek Campos, RD, LDN Pager #: [phone] After-Hours Pager #: [phone]

## 2014-12-03 NOTE — Progress Notes (Signed)
Patient discharging to home, vitals stable for pt. IV dc'd. Telemetry dc'd.  Peg-tube clamped. Discharge education and instructions reviewed with pt, All questions addressed. 12/03/2014 4:32 PM Bridgitt Raggio

## 2014-12-03 NOTE — Progress Notes (Signed)
Utilization review completed. Rawan Riendeau, RN, BSN. 

## 2014-12-03 NOTE — Discharge Summary (Signed)
[facility] Discharge Summary  Patient name: Derek Campos Medical record number: [id number] Date of birth: 03-26-1971 Age: 44 y.o. Gender: male Date of Admission: 12/02/2014  Date of Discharge: 12/03/2014 Admitting Physician: Alveda Reasons, MD  Primary Care Provider: No PCP Per Patient Consultants: none   Indication for Hospitalization: intractable nausea and vomiting   Discharge Diagnoses/Problem List:  Patient Active Problem List   Diagnosis Date Noted  . Mouth pain   . Intractable nausea and vomiting 12/02/2014  . Vomiting 12/02/2014  . Nausea with vomiting   . Neutrophilic leukocytosis   . Oral thrush   . Orthostatic lightheadedness   . Iron deficiency anemia 11/29/2014  . 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 Malnutrition of moderate degree 10/31/2014  . PEG (percutaneous endoscopic gastrostomy) adjustment/replacement/removal   . HCAP (healthcare-associated pneumonia) 10/30/2014  . Sepsis 10/30/2014  . Metastasis 10/30/2014  . Cavitary lesion of lung 10/30/2014  . Pancytopenia due to chemotherapy 10/30/2014  . 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 H/O irradiation, presenting hazards to health 09/13/2014  . 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 Dysphagia, oral phase 09/13/2014  . Squamous cell cancer of tongue 09/13/2014  . Oral cancer 08/11/2014  . S/P percutaneous endoscopic gastrostomy (PEG) tube placement 08/11/2014  . Chronic pain syndrome 08/11/2014  . Cigarette smoker 08/11/2014   Disposition: home  Discharge Condition: stable  Discharge Exam:  General: Chronically ill, emaciated appearing man lying in hospital bed ENTM: Tongue s/p partial resection with thick white plaque noted on mucosal surfaces, Dry appearing mucus membranes Cardiovascular: RRR, normal s1 and s2, no rubs, gallops, or murmurs Respiratory: clear to auscultation bilaterally, no increased work of breathing Abdomen: PEG tube in place  with small amount of brown discharge leaking around tube, no signs of infection. NT, ND Extremities: no edema  Brief Hospital Course:   Intractable Nausea and Vomiting / Dehydration Patient presented with nausea and vomiting following chemotherapy session. Vital signs were normal and  abdominal exam was benign. In the ED, patient was given 2L total of NS, 4mg  of Zofran, and 10mg  of compazine without improvement in his symptoms. Patient was admitted to floor and given Zofran 4mg  every 6 hrs prn, 1L normal saline, then D5 1/2NS @ 150cc/hr. EKG was performed and there was no prolonged QTc. PPI was started. His symptoms resolved overnight. In the morning he was able to tolerate drinking milk.   Oral Thrush Patient was noted to have thrush on his tongue. He was given magic mouthwash which helped resolve his pain when eating/drinking.   Oral SCC with metastasis Home Gabapentin was held at first while not tolerating PO.  Morphine 2mg  IV q4hrs prn was given while he could not have Po pain medication.   Leukocytosis Notable leukocytosis of 34K WBC upon admission. Likely secondary to demargination from stress from vomiting and steroid. Repeated CBC and WBC decreased to 21.  Tobacco Abuse Encourage smoking cessation  Issues for Follow Up:  1. Oral SCC with metastasis: will need to follow up with oncologist  Significant Procedures: none  Significant Labs and Imaging:   Recent Labs Lab 11/29/14 0817 12/02/14 1504 12/03/14 0940  WBC 7.5 34.0* 21.0*  HGB 10.2* 13.6 9.1*  HCT 30.9* 40.1 27.8*  PLT 157 143* 112*    Recent Labs Lab 11/29/14 0816 12/02/14 1504 12/03/14 0629  NA 143 139 139  K 4.4 4.2 3.7  CL 94* 93* 103  CO2 33 33* 26  GLUCOSE 72* 186* 111*  BUN 31* 25* 18  CREATININE 1.0 1.05 0.83  CALCIUM 10.2 10.4* 8.6*  MG 2.5  --   --   ALKPHOS 85* 110  --   AST 31 29  --   ALT 22 18  --   ALBUMIN  --  4.2  --     Results/Tests Pending at Time of Discharge:  none  Discharge Medications:    Medication List    STOP taking these medications        ondansetron 8 MG tablet  Commonly known as:  ZOFRAN     prochlorperazine 10 MG tablet  Commonly known as:  COMPAZINE      TAKE these medications        dexamethasone 4 MG tablet  Commonly known as:  DECADRON  Take 2 tablets (8 mg total) by mouth 2 (two) times daily. Start the day before Taxotere. Then again the day after chemo for 3 days.     fentaNYL 75 MCG/HR  Commonly known as:  DURAGESIC - dosed mcg/hr  Place 2 patches (150 mcg total) onto the skin every 3 (three) days.     gabapentin 600 MG tablet  Commonly known as:  NEURONTIN  Take 1 tablet (600 mg total) by mouth 3 (three) times daily.     oxycodone 30 MG immediate release tablet  Commonly known as:  ROXICODONE  Take 1-1 1/2 tablets, if needed, every 4 hrs for pain        Discharge Instructions: Please refer to Patient Instructions section of EMR for full details.  Patient was counseled important signs and symptoms that should prompt return to medical care, changes in medications, dietary instructions, activity restrictions, and follow up appointments.   Follow-Up Appointments:     Follow-up Information    Follow up with follow up with oncologist. Schedule an appointment as soon as possible for a visit in 1 week.      Carlyle Dolly, MD 12/03/2014, 3:29 PM PGY-1, [place]

## 2014-12-03 NOTE — Progress Notes (Signed)
Family Medicine Teaching Service Daily Progress Note Intern Pager: [phone]  Patient name: Trip Cavanagh Medical record number: [id number] Date of birth: 08-22-1970 Age: 44 y.o. Gender: male  Primary Care Provider: No PCP Per Patient Consultants: none Code Status: full   Pt Overview and Major Events to Date:  07/21: Admit to telemetry 07/22: N/V resolved  Assessment and Plan:  Piers Baade is a 44 y.o. male presenting with intractable nausea and vomiting after receiving chemotherapy. PMH is significant for tongue SCC with metastasis and tobacco abuse  Intractable Nausea and Vomiting / Dehydration. Nausea and vomiting likely secondary to emetogenic effects of recent chemotherapy. Vital signs normal, abdominal exam benign, doubt intra-abdominal process. Presentation not consistent with viral etiology. Will proceed with conservative management. No significant electrolyte disturbances.  - Zofran 4mg  every 6 hrs prn -  D5 1/2NS @ 150cc/hr -  EKG to check QTc.  - Orthostatic vitals WNL -  PPI  Oral Thrush. Likely source of patient's mouth pain - Magic Mouth Wash - Consider trial of diflucan if not improving.   Oral SCC with metastasis. Mets to lungs noted on PET scan in April. - Hold home gabapentin while not tolerating PO - Morphine 2mg  IV q4hrs prn while not tolerating PO - Continue home fentanyl  Leukocytosis. WBC 34.0 on admission (up from 7.5 three days ago). Possibly due to recently receiving Neulasta and decadron with chemotherapy.  - Follow up AM CBC - Consider obtaining peripheral smear if continue to uptrend  Tobacco Abuse - Encourage smoking cessation   FEN/GI: NPO, ADAT, D5 1/2NS @ 150cc/hr Prophylaxis: SubQ heparin  Disposition: possible discharge home today  Subjective:  Patient is doing well this morning resting comfortably in bed. He states that his nausea and vomiting have completely resolved. The last time he had an episode of emesis  was last night around 10pm. The only complaint he has this morning is a mouth pain and sore throat. He was able to drink 2 cartons of milk this morning without nausea or vomiting.   Objective: Temp:  [98 F (36.7 C)-100.2 F (37.9 C)] 98 F (36.7 C) (07/22 0541) Pulse Rate:  [67-115] 67 (07/22 0541) Resp:  [1-18] 12 (07/22 0541) BP: (93-125)/(54-87) 108/67 mmHg (07/22 0541) SpO2:  [93 %-100 %] 99 % (07/22 0541) Weight:  [120 lb (54.432 kg)] 120 lb (54.432 kg) (07/21 1343) Physical Exam: General: Chronically ill, emaciated appearing man lying in hospital bed ENTM: Tongue s/p partial resection with thick white plaque noted on mucosal surfaces, Dry appearing mucus membranes Cardiovascular: RRR, normal s1 and s2, no rubs, gallops, or murmurs Respiratory: clear to auscultation bilaterally, no increased work of breathing Abdomen: PEG tube in place with small amount of brown discharge leaking around tube, no signs of infection. NT, ND Extremities: no edema  Laboratory:  Recent Labs Lab 11/29/14 0817 12/02/14 1504  WBC 7.5 34.0*  HGB 10.2* 13.6  HCT 30.9* 40.1  PLT 157 143*    Recent Labs Lab 11/29/14 0816 12/02/14 1504  NA 143 139  K 4.4 4.2  CL 94* 93*  CO2 33 33*  BUN 31* 25*  CREATININE 1.0 1.05  CALCIUM 10.2 10.4*  PROT 8.0 8.6*  BILITOT 0.60 0.9  ALKPHOS 85* 110  ALT 22 18  AST 31 29  GLUCOSE 72* 186*     Imaging/Diagnostic Tests: No results found.   Carlyle Dolly, MD 12/03/2014, 7:26 AM PGY-1, [facility] Intern pager: [phone], text pages welcome

## 2014-12-06 ENCOUNTER — Ambulatory Visit (HOSPITAL_BASED_OUTPATIENT_CLINIC_OR_DEPARTMENT_OTHER): Payer: No Typology Code available for payment source

## 2014-12-06 ENCOUNTER — Other Ambulatory Visit: Payer: Self-pay | Admitting: Family

## 2014-12-06 ENCOUNTER — Other Ambulatory Visit: Payer: Self-pay | Admitting: *Deleted

## 2014-12-06 ENCOUNTER — Other Ambulatory Visit (HOSPITAL_BASED_OUTPATIENT_CLINIC_OR_DEPARTMENT_OTHER): Payer: No Typology Code available for payment source

## 2014-12-06 VITALS — BP 87/54 | HR 60 | Resp 18

## 2014-12-06 DIAGNOSIS — C069 Malignant neoplasm of mouth, unspecified: Secondary | ICD-10-CM

## 2014-12-06 DIAGNOSIS — Z5112 Encounter for antineoplastic immunotherapy: Secondary | ICD-10-CM

## 2014-12-06 DIAGNOSIS — C770 Secondary and unspecified malignant neoplasm of lymph nodes of head, face and neck: Secondary | ICD-10-CM

## 2014-12-06 LAB — CMP (CANCER CENTER ONLY)
ALT(SGPT): 16 U/L (ref 10–47)
AST: 29 U/L (ref 11–38)
Albumin: 3.6 g/dL (ref 3.3–5.5)
Alkaline Phosphatase: 78 U/L (ref 26–84)
BILIRUBIN TOTAL: 0.8 mg/dL (ref 0.20–1.60)
BUN: 25 mg/dL — AB (ref 7–22)
CHLORIDE: 97 meq/L — AB (ref 98–108)
CO2: 29 meq/L (ref 18–33)
Calcium: 9.3 mg/dL (ref 8.0–10.3)
Creat: 0.8 mg/dl (ref 0.6–1.2)
Glucose, Bld: 143 mg/dL — ABNORMAL HIGH (ref 73–118)
POTASSIUM: 4.3 meq/L (ref 3.3–4.7)
Sodium: 135 mEq/L (ref 128–145)
TOTAL PROTEIN: 6.6 g/dL (ref 6.4–8.1)

## 2014-12-06 LAB — CBC WITH DIFFERENTIAL (CANCER CENTER ONLY)
BASO#: 0 10*3/uL (ref 0.0–0.2)
BASO%: 0 % (ref 0.0–2.0)
EOS ABS: 0 10*3/uL (ref 0.0–0.5)
EOS%: 0 % (ref 0.0–7.0)
HCT: 24.6 % — ABNORMAL LOW (ref 38.7–49.9)
HEMOGLOBIN: 8.2 g/dL — AB (ref 13.0–17.1)
LYMPH#: 0.1 10*3/uL — AB (ref 0.9–3.3)
LYMPH%: 4.6 % — AB (ref 14.0–48.0)
MCH: 30.9 pg (ref 28.0–33.4)
MCHC: 33.3 g/dL (ref 32.0–35.9)
MCV: 93 fL (ref 82–98)
MONO#: 0.3 10*3/uL (ref 0.1–0.9)
MONO%: 12.2 % (ref 0.0–13.0)
NEUT#: 2 10*3/uL (ref 1.5–6.5)
NEUT%: 83.2 % — AB (ref 40.0–80.0)
PLATELETS: 104 10*3/uL — AB (ref 145–400)
RBC: 2.65 10*6/uL — ABNORMAL LOW (ref 4.20–5.70)
RDW: 17.5 % — AB (ref 11.1–15.7)
WBC: 2.4 10*3/uL — ABNORMAL LOW (ref 4.0–10.0)

## 2014-12-06 LAB — MAGNESIUM (CC13): Magnesium: 1.7 mg/dl (ref 1.5–2.5)

## 2014-12-06 MED ORDER — DIPHENHYDRAMINE HCL 50 MG/ML IJ SOLN
25.0000 mg | Freq: Once | INTRAMUSCULAR | Status: AC
  Administered 2014-12-06: 25 mg via INTRAVENOUS

## 2014-12-06 MED ORDER — PROMETHAZINE HCL 25 MG PO TABS: 25.0000 mg | ORAL_TABLET | Freq: Four times a day (QID) | ORAL | Status: DC | PRN

## 2014-12-06 MED ORDER — RANITIDINE HCL 150 MG PO TABS: 150.0000 mg | ORAL_TABLET | Freq: Two times a day (BID) | ORAL | Status: AC

## 2014-12-06 MED ORDER — MAGIC MOUTHWASH W/LIDOCAINE: 15.0000 mL | Freq: Four times a day (QID) | ORAL | Status: AC | PRN

## 2014-12-06 MED ORDER — DIPHENHYDRAMINE HCL 50 MG/ML IJ SOLN
INTRAMUSCULAR | Status: AC
  Filled 2014-12-06: qty 1

## 2014-12-06 MED ORDER — RANITIDINE HCL 150 MG PO TABS: 150.0000 mg | ORAL_TABLET | Freq: Two times a day (BID) | ORAL | Status: DC

## 2014-12-06 MED ORDER — HYDROMORPHONE HCL 4 MG/ML IJ SOLN
4.0000 mg | Freq: Once | INTRAMUSCULAR | Status: AC
  Administered 2014-12-06: 4 mg via INTRAVENOUS

## 2014-12-06 MED ORDER — CETUXIMAB CHEMO IV INJECTION 200 MG/100ML
250.0000 mg/m2 | Freq: Once | INTRAVENOUS | Status: AC
  Administered 2014-12-06: 400 mg via INTRAVENOUS
  Filled 2014-12-06: qty 200

## 2014-12-06 MED ORDER — HYDROMORPHONE HCL 4 MG/ML IJ SOLN
INTRAMUSCULAR | Status: AC
  Filled 2014-12-06: qty 1

## 2014-12-06 MED ORDER — SODIUM CHLORIDE 0.9 % IV SOLN
Freq: Once | INTRAVENOUS | Status: AC
  Administered 2014-12-06: 12:00:00 via INTRAVENOUS

## 2014-12-06 MED ORDER — MAGIC MOUTHWASH W/LIDOCAINE: 15.0000 mL | Freq: Four times a day (QID) | ORAL | Status: DC | PRN

## 2014-12-06 NOTE — Patient Instructions (Signed)
Cetuximab injection What is this medicine? CETUXIMAB (se TUX i mab) is a chemotherapy drug. It targets a specific protein within cancer cells and stops the cells from growing. It is used to treat colorectal cancer and head and neck cancer. This medicine may be used for other purposes; ask your health care provider or pharmacist if you have questions. COMMON BRAND NAME(S): Erbitux What should I tell my health care provider before I take this medicine? They need to know if you have any of these conditions: -heart disease -history of irregular heartbeat -history of low levels of calcium, magnesium, or potassium in the blood -lung or breathing disease, like asthma -an unusual or allergic reaction to cetuximab, other medicines, foods, dyes, or preservatives -pregnant or trying to get pregnant -breast-feeding How should I use this medicine? This drug is given as an infusion into a vein. It is administered in a hospital or clinic by a specially trained health care professional. Talk to your pediatrician regarding the use of this medicine in children. Special care may be needed. Overdosage: If you think you have taken too much of this medicine contact a poison control center or emergency room at once. NOTE: This medicine is only for you. Do not share this medicine with others. What if I miss a dose? It is important not to miss your dose. Call your doctor or health care professional if you are unable to keep an appointment. What may interact with this medicine? Interactions are not expected. This list may not describe all possible interactions. Give your health care provider a list of all the medicines, herbs, non-prescription drugs, or dietary supplements you use. Also tell them if you smoke, drink alcohol, or use illegal drugs. Some items may interact with your medicine. What should I watch for while using this medicine? Visit your doctor or health care professional for regular checks on your  progress. This drug may make you feel generally unwell. This is not uncommon, as chemotherapy can affect healthy cells as well as cancer cells. Report any side effects. Continue your course of treatment even though you feel ill unless your doctor tells you to stop. This medicine can make you more sensitive to the sun. Keep out of the sun while taking this medicine and for 2 months after the last dose. If you cannot avoid being in the sun, wear protective clothing and use sunscreen. Do not use sun lamps or tanning beds/booths. You may need blood work done while you are taking this medicine. In some cases, you may be given additional medicines to help with side effects. Follow all directions for their use. Call your doctor or health care professional for advice if you get a fever, chills or sore throat, or other symptoms of a cold or flu. Do not treat yourself. This drug decreases your body's ability to fight infections. Try to avoid being around people who are sick. Avoid taking products that contain aspirin, acetaminophen, ibuprofen, naproxen, or ketoprofen unless instructed by your doctor. These medicines may hide a fever. Do not become pregnant while taking this medicine. Women should inform their doctor if they wish to become pregnant or think they might be pregnant. There is a potential for serious side effects to an unborn child. Use adequate birth control methods. Avoid pregnancy for at least 6 months after your last dose. Talk to your health care professional or pharmacist for more information. Do not breast-feed an infant while taking this medicine or during the 2 months after your last   dose. What side effects may I notice from receiving this medicine? Side effects that you should report to your doctor or health care professional as soon as possible: -allergic reactions like skin rash, itching or hives, swelling of the face, lips, or tongue -breathing problems -changes in vision -fast, irregular  heartbeat -feeling faint or lightheaded, falls -fever, chills -mouth sores -redness, blistering, peeling or loosening of the skin, including inside the mouth -trouble passing urine or change in the amount of urine -unusually weak or tired Side effects that usually do not require medical attention (report to your doctor or health care professional if they continue or are bothersome): -changes in skin like acne, cracks, skin dryness -constipation -diarrhea -headache -nail changes -nausea, vomiting -stomach upset -weight loss This list may not describe all possible side effects. Call your doctor for medical advice about side effects. You may report side effects to FDA at 1-800-FDA-1088. Where should I keep my medicine? This drug is given in a hospital or clinic and will not be stored at home. NOTE: This sheet is a summary. It may not cover all possible information. If you have questions about this medicine, talk to your doctor, pharmacist, or health care provider.  2015, [employer]. (2013-08-12 16:14:34)  

## 2014-12-07 ENCOUNTER — Telehealth: Payer: Self-pay | Admitting: Hematology & Oncology

## 2014-12-07 NOTE — Telephone Encounter (Signed)
Application scanned ([facility] PATIENT ASSISTANCE FOR MEDICAL NUTRITION  APPLICATION)

## 2014-12-13 ENCOUNTER — Ambulatory Visit: Payer: Self-pay

## 2014-12-13 ENCOUNTER — Other Ambulatory Visit: Payer: Self-pay

## 2014-12-14 LAB — AFB CULTURE WITH SMEAR (NOT AT ARMC): Acid Fast Smear: NONE SEEN

## 2014-12-15 ENCOUNTER — Ambulatory Visit (HOSPITAL_BASED_OUTPATIENT_CLINIC_OR_DEPARTMENT_OTHER): Payer: No Typology Code available for payment source

## 2014-12-15 ENCOUNTER — Other Ambulatory Visit (HOSPITAL_BASED_OUTPATIENT_CLINIC_OR_DEPARTMENT_OTHER): Payer: No Typology Code available for payment source

## 2014-12-15 DIAGNOSIS — C069 Malignant neoplasm of mouth, unspecified: Secondary | ICD-10-CM

## 2014-12-15 DIAGNOSIS — C760 Malignant neoplasm of head, face and neck: Secondary | ICD-10-CM

## 2014-12-15 DIAGNOSIS — Z5111 Encounter for antineoplastic chemotherapy: Secondary | ICD-10-CM

## 2014-12-15 LAB — HOLD TUBE, BLOOD BANK - CHCC SATELLITE

## 2014-12-15 LAB — CMP (CANCER CENTER ONLY)
ALK PHOS: 65 U/L (ref 26–84)
ALT: 22 U/L (ref 10–47)
AST: 30 U/L (ref 11–38)
Albumin: 3.4 g/dL (ref 3.3–5.5)
BUN: 17 mg/dL (ref 7–22)
CHLORIDE: 97 meq/L — AB (ref 98–108)
CO2: 31 meq/L (ref 18–33)
Calcium: 9.4 mg/dL (ref 8.0–10.3)
Creat: 0.8 mg/dl (ref 0.6–1.2)
Glucose, Bld: 76 mg/dL (ref 73–118)
POTASSIUM: 4.2 meq/L (ref 3.3–4.7)
SODIUM: 139 meq/L (ref 128–145)
Total Bilirubin: 0.6 mg/dl (ref 0.20–1.60)
Total Protein: 7.3 g/dL (ref 6.4–8.1)

## 2014-12-15 LAB — MAGNESIUM (CC13): MAGNESIUM: 1.8 mg/dL (ref 1.5–2.5)

## 2014-12-15 MED ORDER — PEGFILGRASTIM 6 MG/0.6ML ~~LOC~~ PSKT: 6.0000 mg | PREFILLED_SYRINGE | Freq: Once | SUBCUTANEOUS | Status: DC

## 2014-12-15 MED ORDER — DIPHENHYDRAMINE HCL 50 MG/ML IJ SOLN
INTRAMUSCULAR | Status: AC
Start: 2014-12-15 — End: 2014-12-15
  Filled 2014-12-15: qty 1

## 2014-12-15 MED ORDER — DEXAMETHASONE SODIUM PHOSPHATE 100 MG/10ML IJ SOLN: Freq: Once | INTRAMUSCULAR | Status: DC

## 2014-12-15 MED ORDER — CETUXIMAB CHEMO IV INJECTION 200 MG/100ML
250.0000 mg/m2 | Freq: Once | INTRAVENOUS | Status: AC
  Administered 2014-12-15: 400 mg via INTRAVENOUS
  Filled 2014-12-15: qty 200

## 2014-12-15 MED ORDER — SODIUM CHLORIDE 0.9 % IV SOLN: 550.0000 mg | Freq: Once | INTRAVENOUS | Status: DC

## 2014-12-15 MED ORDER — SODIUM CHLORIDE 0.9 % IJ SOLN
10.0000 mL | INTRAMUSCULAR | Status: DC | PRN
  Administered 2014-12-15: 10 mL
  Filled 2014-12-15: qty 10

## 2014-12-15 MED ORDER — HEPARIN SOD (PORK) LOCK FLUSH 100 UNIT/ML IV SOLN
500.0000 [IU] | Freq: Once | INTRAVENOUS | Status: AC | PRN
  Administered 2014-12-15: 500 [IU]
  Filled 2014-12-15: qty 5

## 2014-12-15 MED ORDER — SODIUM CHLORIDE 0.9 % IV SOLN: Freq: Once | INTRAVENOUS | Status: DC

## 2014-12-15 MED ORDER — SODIUM CHLORIDE 0.9 % IV SOLN
Freq: Once | INTRAVENOUS | Status: AC
  Administered 2014-12-15: 11:00:00 via INTRAVENOUS

## 2014-12-15 MED ORDER — DOCETAXEL CHEMO INJECTION 160 MG/16ML: 65.0000 mg/m2 | Freq: Once | INTRAVENOUS | Status: DC

## 2014-12-15 MED ORDER — DIPHENHYDRAMINE HCL 50 MG/ML IJ SOLN
50.0000 mg | Freq: Once | INTRAMUSCULAR | Status: AC
  Administered 2014-12-15: 50 mg via INTRAVENOUS

## 2014-12-15 NOTE — Patient Instructions (Signed)
[facility] Discharge Instructions for Patients Receiving Chemotherapy  Today you received the following chemotherapy agents Erbitux.  To help prevent nausea and vomiting after your treatment, we encourage you to take your nausea medication.   If you develop nausea and vomiting that is not controlled by your nausea medication, call the clinic.   BELOW ARE SYMPTOMS THAT SHOULD BE REPORTED IMMEDIATELY:  *FEVER GREATER THAN 100.5 F  *CHILLS WITH OR WITHOUT FEVER  NAUSEA AND VOMITING THAT IS NOT CONTROLLED WITH YOUR NAUSEA MEDICATION  *UNUSUAL SHORTNESS OF BREATH  *UNUSUAL BRUISING OR BLEEDING  TENDERNESS IN MOUTH AND THROAT WITH OR WITHOUT PRESENCE OF ULCERS  *URINARY PROBLEMS  *BOWEL PROBLEMS  UNUSUAL RASH Items with * indicate a potential emergency and should be followed up as soon as possible.  Feel free to call the clinic you have any questions or concerns. The clinic phone number is [phone].  Please show the CHEMO ALERT CARD at check-in to the Emergency Department and triage nurse.   

## 2014-12-20 ENCOUNTER — Other Ambulatory Visit (HOSPITAL_BASED_OUTPATIENT_CLINIC_OR_DEPARTMENT_OTHER): Payer: No Typology Code available for payment source

## 2014-12-20 ENCOUNTER — Ambulatory Visit (HOSPITAL_BASED_OUTPATIENT_CLINIC_OR_DEPARTMENT_OTHER): Payer: No Typology Code available for payment source

## 2014-12-20 ENCOUNTER — Ambulatory Visit (HOSPITAL_BASED_OUTPATIENT_CLINIC_OR_DEPARTMENT_OTHER): Payer: No Typology Code available for payment source | Admitting: Hematology & Oncology

## 2014-12-20 VITALS — Ht 70.0 in

## 2014-12-20 VITALS — BP 98/80 | HR 74 | Temp 98.0°F | Resp 16 | Ht 61.0 in | Wt 112.0 lb

## 2014-12-20 DIAGNOSIS — C069 Malignant neoplasm of mouth, unspecified: Secondary | ICD-10-CM

## 2014-12-20 DIAGNOSIS — C029 Malignant neoplasm of tongue, unspecified: Secondary | ICD-10-CM

## 2014-12-20 DIAGNOSIS — K909 Intestinal malabsorption, unspecified: Secondary | ICD-10-CM

## 2014-12-20 DIAGNOSIS — M899 Disorder of bone, unspecified: Secondary | ICD-10-CM

## 2014-12-20 DIAGNOSIS — Z5112 Encounter for antineoplastic immunotherapy: Secondary | ICD-10-CM

## 2014-12-20 DIAGNOSIS — D509 Iron deficiency anemia, unspecified: Secondary | ICD-10-CM

## 2014-12-20 DIAGNOSIS — R634 Abnormal weight loss: Secondary | ICD-10-CM

## 2014-12-20 LAB — CBC WITH DIFFERENTIAL (CANCER CENTER ONLY)
BASO#: 0 10*3/uL (ref 0.0–0.2)
BASO%: 0.2 % (ref 0.0–2.0)
EOS%: 0 % (ref 0.0–7.0)
Eosinophils Absolute: 0 10*3/uL (ref 0.0–0.5)
HCT: 35.7 % — ABNORMAL LOW (ref 38.7–49.9)
HGB: 12.2 g/dL — ABNORMAL LOW (ref 13.0–17.1)
LYMPH#: 0.2 10*3/uL — ABNORMAL LOW (ref 0.9–3.3)
LYMPH%: 4.9 % — AB (ref 14.0–48.0)
MCH: 32.3 pg (ref 28.0–33.4)
MCHC: 34.2 g/dL (ref 32.0–35.9)
MCV: 94 fL (ref 82–98)
MONO#: 0.4 10*3/uL (ref 0.1–0.9)
MONO%: 8 % (ref 0.0–13.0)
NEUT#: 4.1 10*3/uL (ref 1.5–6.5)
NEUT%: 86.9 % — AB (ref 40.0–80.0)
Platelets: 126 10*3/uL — ABNORMAL LOW (ref 145–400)
RBC: 3.78 10*6/uL — AB (ref 4.20–5.70)
RDW: 19.7 % — AB (ref 11.1–15.7)
WBC: 4.7 10*3/uL (ref 4.0–10.0)

## 2014-12-20 LAB — CMP (CANCER CENTER ONLY)
ALT: 16 U/L (ref 10–47)
AST: 24 U/L (ref 11–38)
Albumin: 3.8 g/dL (ref 3.3–5.5)
Alkaline Phosphatase: 56 U/L (ref 26–84)
BUN, Bld: 14 mg/dL (ref 7–22)
CHLORIDE: 101 meq/L (ref 98–108)
CO2: 31 mEq/L (ref 18–33)
Calcium: 9.8 mg/dL (ref 8.0–10.3)
Creat: 1 mg/dl (ref 0.6–1.2)
GLUCOSE: 137 mg/dL — AB (ref 73–118)
Potassium: 3.9 mEq/L (ref 3.3–4.7)
SODIUM: 141 meq/L (ref 128–145)
Total Bilirubin: 0.8 mg/dl (ref 0.20–1.60)
Total Protein: 7.8 g/dL (ref 6.4–8.1)

## 2014-12-20 LAB — MAGNESIUM (CC13): MAGNESIUM: 2.1 mg/dL (ref 1.5–2.5)

## 2014-12-20 LAB — IRON AND TIBC CHCC
%SAT: 16 % — ABNORMAL LOW (ref 20–55)
IRON: 42 ug/dL (ref 42–163)
TIBC: 261 ug/dL (ref 202–409)
UIBC: 219 ug/dL (ref 117–376)

## 2014-12-20 LAB — FERRITIN CHCC: Ferritin: 394 ng/ml — ABNORMAL HIGH (ref 22–316)

## 2014-12-20 MED ORDER — SODIUM CHLORIDE 0.9 % IV SOLN
550.0000 mg | Freq: Once | INTRAVENOUS | Status: AC
  Administered 2014-12-20: 550 mg via INTRAVENOUS
  Filled 2014-12-20: qty 55

## 2014-12-20 MED ORDER — SODIUM CHLORIDE 0.9 % IV SOLN
Freq: Once | INTRAVENOUS | Status: AC
  Administered 2014-12-20: 11:00:00 via INTRAVENOUS

## 2014-12-20 MED ORDER — DIPHENHYDRAMINE HCL 50 MG/ML IJ SOLN
INTRAMUSCULAR | Status: AC
  Filled 2014-12-20: qty 1

## 2014-12-20 MED ORDER — PALONOSETRON HCL INJECTION 0.25 MG/5ML
INTRAVENOUS | Status: AC
  Filled 2014-12-20: qty 5

## 2014-12-20 MED ORDER — SODIUM CHLORIDE 0.9 % IV SOLN
Freq: Once | INTRAVENOUS | Status: AC
  Administered 2014-12-20: 09:00:00 via INTRAVENOUS

## 2014-12-20 MED ORDER — DIPHENHYDRAMINE HCL 50 MG/ML IJ SOLN
50.0000 mg | Freq: Once | INTRAMUSCULAR | Status: AC
  Administered 2014-12-20: 50 mg via INTRAVENOUS

## 2014-12-20 MED ORDER — SODIUM CHLORIDE 0.9 % IV SOLN
Freq: Once | INTRAVENOUS | Status: AC
  Administered 2014-12-20: 11:00:00 via INTRAVENOUS
  Filled 2014-12-20: qty 5

## 2014-12-20 MED ORDER — DOCETAXEL CHEMO INJECTION 160 MG/16ML
65.0000 mg/m2 | Freq: Once | INTRAVENOUS | Status: AC
  Administered 2014-12-20: 110 mg via INTRAVENOUS
  Filled 2014-12-20: qty 11

## 2014-12-20 MED ORDER — CETUXIMAB CHEMO IV INJECTION 200 MG/100ML
250.0000 mg/m2 | Freq: Once | INTRAVENOUS | Status: AC
  Administered 2014-12-20: 400 mg via INTRAVENOUS
  Filled 2014-12-20: qty 200

## 2014-12-20 MED ORDER — PALONOSETRON HCL INJECTION 0.25 MG/5ML
0.2500 mg | Freq: Once | INTRAVENOUS | Status: AC
  Administered 2014-12-20: 0.25 mg via INTRAVENOUS

## 2014-12-20 NOTE — Patient Instructions (Signed)
[facility] Discharge Instructions for Patients Receiving Chemotherapy  Today you received the following chemotherapy agents Erbitux, Carboplatin, Taxotere  To help prevent nausea and vomiting after your treatment, we encourage you to take your nausea medication  1) Zofran (Ondansetron) 8 mg.  Take 1 tablet in the morning and one in the evening beginning day after chemotherapy.  Take for 3 days.  2) Decadron - Continue taking Decadron for 3 days after chemotherapy.  3) Compazine (Prochlorperazine) Take 1 tablet by mouth every 6 hours AS NEEDED for nausea.     If you develop nausea and vomiting that is not controlled by your nausea medication, call the clinic.   BELOW ARE SYMPTOMS THAT SHOULD BE REPORTED IMMEDIATELY:  *FEVER GREATER THAN 100.5 F  *CHILLS WITH OR WITHOUT FEVER  NAUSEA AND VOMITING THAT IS NOT CONTROLLED WITH YOUR NAUSEA MEDICATION  *UNUSUAL SHORTNESS OF BREATH  *UNUSUAL BRUISING OR BLEEDING  TENDERNESS IN MOUTH AND THROAT WITH OR WITHOUT PRESENCE OF ULCERS  *URINARY PROBLEMS  *BOWEL PROBLEMS  UNUSUAL RASH Items with * indicate a potential emergency and should be followed up as soon as possible.  Feel free to call the clinic you have any questions or concerns. The clinic phone number is [phone].  Please show the [facility] at check-in to the Emergency Department and triage nurse.      Cetuximab injection What is this medicine? CETUXIMAB (se TUX i mab) is a chemotherapy drug. It targets a specific protein within cancer cells and stops the cells from growing. It is used to treat colorectal cancer and head and neck cancer. This medicine may be used for other purposes; ask your health care provider or pharmacist if you have questions. COMMON BRAND NAME(S): Erbitux What should I tell my health care provider before I take this medicine? They need to know if you have any of these conditions: -heart disease -history of irregular  heartbeat -history of low levels of calcium, magnesium, or potassium in the blood -lung or breathing disease, like asthma -an unusual or allergic reaction to cetuximab, other medicines, foods, dyes, or preservatives -pregnant or trying to get pregnant -breast-feeding How should I use this medicine? This drug is given as an infusion into a vein. It is administered in a hospital or clinic by a specially trained health care professional. Talk to your pediatrician regarding the use of this medicine in children. Special care may be needed. Overdosage: If you think you have taken too much of this medicine contact a poison control center or emergency room at once. NOTE: This medicine is only for you. Do not share this medicine with others. What if I miss a dose? It is important not to miss your dose. Call your doctor or health care professional if you are unable to keep an appointment. What may interact with this medicine? Interactions are not expected. This list may not describe all possible interactions. Give your health care provider a list of all the medicines, herbs, non-prescription drugs, or dietary supplements you use. Also tell them if you smoke, drink alcohol, or use illegal drugs. Some items may interact with your medicine. What should I watch for while using this medicine? Visit your doctor or health care professional for regular checks on your progress. This drug may make you feel generally unwell. This is not uncommon, as chemotherapy can affect healthy cells as well as cancer cells. Report any side effects. Continue your course of treatment even though you feel ill unless your  doctor tells you to stop. This medicine can make you more sensitive to the sun. Keep out of the sun while taking this medicine and for 2 months after the last dose. If you cannot avoid being in the sun, wear protective clothing and use sunscreen. Do not use sun lamps or tanning beds/booths. You may need blood work  done while you are taking this medicine. In some cases, you may be given additional medicines to help with side effects. Follow all directions for their use. Call your doctor or health care professional for advice if you get a fever, chills or sore throat, or other symptoms of a cold or flu. Do not treat yourself. This drug decreases your body's ability to fight infections. Try to avoid being around people who are sick. Avoid taking products that contain aspirin, acetaminophen, ibuprofen, naproxen, or ketoprofen unless instructed by your doctor. These medicines may hide a fever. Do not become pregnant while taking this medicine. Women should inform their doctor if they wish to become pregnant or think they might be pregnant. There is a potential for serious side effects to an unborn child. Use adequate birth control methods. Avoid pregnancy for at least 6 months after your last dose. Talk to your health care professional or pharmacist for more information. Do not breast-feed an infant while taking this medicine or during the 2 months after your last dose. What side effects may I notice from receiving this medicine? Side effects that you should report to your doctor or health care professional as soon as possible: -allergic reactions like skin rash, itching or hives, swelling of the face, lips, or tongue -breathing problems -changes in vision -fast, irregular heartbeat -feeling faint or lightheaded, falls -fever, chills -mouth sores -redness, blistering, peeling or loosening of the skin, including inside the mouth -trouble passing urine or change in the amount of urine -unusually weak or tired Side effects that usually do not require medical attention (report to your doctor or health care professional if they continue or are bothersome): -changes in skin like acne, cracks, skin dryness -constipation -diarrhea -headache -nail changes -nausea, vomiting -stomach upset -weight loss This list may  not describe all possible side effects. Call your doctor for medical advice about side effects. You may report side effects to FDA at 1-800-FDA-1088. Where should I keep my medicine? This drug is given in a hospital or clinic and will not be stored at home. NOTE: This sheet is a summary. It may not cover all possible information. If you have questions about this medicine, talk to your doctor, pharmacist, or health care provider.  2015, Elsevier/Gold Standard. (2013-08-12 16:14:34)  Carboplatin injection What is this medicine? CARBOPLATIN (KAR boe pla tin) is a chemotherapy drug. It targets fast dividing cells, like cancer cells, and causes these cells to die. This medicine is used to treat ovarian cancer and many other cancers. This medicine may be used for other purposes; ask your health care provider or pharmacist if you have questions. COMMON BRAND NAME(S): Paraplatin What should I tell my health care provider before I take this medicine? They need to know if you have any of these conditions: -blood disorders -hearing problems -kidney disease -recent or ongoing radiation therapy -an unusual or allergic reaction to carboplatin, cisplatin, other chemotherapy, other medicines, foods, dyes, or preservatives -pregnant or trying to get pregnant -breast-feeding How should I use this medicine? This drug is usually given as an infusion into a vein. It is administered in a hospital or clinic by  a specially trained health care professional. Talk to your pediatrician regarding the use of this medicine in children. Special care may be needed. Overdosage: If you think you have taken too much of this medicine contact a poison control center or emergency room at once. NOTE: This medicine is only for you. Do not share this medicine with others. What if I miss a dose? It is important not to miss a dose. Call your doctor or health care professional if you are unable to keep an appointment. What may  interact with this medicine? -medicines for seizures -medicines to increase blood counts like filgrastim, pegfilgrastim, sargramostim -some antibiotics like amikacin, gentamicin, neomycin, streptomycin, tobramycin -vaccines Talk to your doctor or health care professional before taking any of these medicines: -acetaminophen -aspirin -ibuprofen -ketoprofen -naproxen This list may not describe all possible interactions. Give your health care provider a list of all the medicines, herbs, non-prescription drugs, or dietary supplements you use. Also tell them if you smoke, drink alcohol, or use illegal drugs. Some items may interact with your medicine. What should I watch for while using this medicine? Your condition will be monitored carefully while you are receiving this medicine. You will need important blood work done while you are taking this medicine. This drug may make you feel generally unwell. This is not uncommon, as chemotherapy can affect healthy cells as well as cancer cells. Report any side effects. Continue your course of treatment even though you feel ill unless your doctor tells you to stop. In some cases, you may be given additional medicines to help with side effects. Follow all directions for their use. Call your doctor or health care professional for advice if you get a fever, chills or sore throat, or other symptoms of a cold or flu. Do not treat yourself. This drug decreases your body's ability to fight infections. Try to avoid being around people who are sick. This medicine may increase your risk to bruise or bleed. Call your doctor or health care professional if you notice any unusual bleeding. Be careful brushing and flossing your teeth or using a toothpick because you may get an infection or bleed more easily. If you have any dental work done, tell your dentist you are receiving this medicine. Avoid taking products that contain aspirin, acetaminophen, ibuprofen, naproxen, or  ketoprofen unless instructed by your doctor. These medicines may hide a fever. Do not become pregnant while taking this medicine. Women should inform their doctor if they wish to become pregnant or think they might be pregnant. There is a potential for serious side effects to an unborn child. Talk to your health care professional or pharmacist for more information. Do not breast-feed an infant while taking this medicine. What side effects may I notice from receiving this medicine? Side effects that you should report to your doctor or health care professional as soon as possible: -allergic reactions like skin rash, itching or hives, swelling of the face, lips, or tongue -signs of infection - fever or chills, cough, sore throat, pain or difficulty passing urine -signs of decreased platelets or bleeding - bruising, pinpoint red spots on the skin, black, tarry stools, nosebleeds -signs of decreased red blood cells - unusually weak or tired, fainting spells, lightheadedness -breathing problems -changes in hearing -changes in vision -chest pain -high blood pressure -low blood counts - This drug may decrease the number of white blood cells, red blood cells and platelets. You may be at increased risk for infections and bleeding. -nausea and vomiting -  pain, swelling, redness or irritation at the injection site -pain, tingling, numbness in the hands or feet -problems with balance, talking, walking -trouble passing urine or change in the amount of urine Side effects that usually do not require medical attention (report to your doctor or health care professional if they continue or are bothersome): -hair loss -loss of appetite -metallic taste in the mouth or changes in taste This list may not describe all possible side effects. Call your doctor for medical advice about side effects. You may report side effects to FDA at 1-800-FDA-1088. Where should I keep my medicine? This drug is given in a hospital or  clinic and will not be stored at home. NOTE: This sheet is a summary. It may not cover all possible information. If you have questions about this medicine, talk to your doctor, pharmacist, or health care provider.  2015, Elsevier/Gold Standard. (2007-08-05 14:38:05)   Docetaxel injection What is this medicine? DOCETAXEL (doe se TAX el) is a chemotherapy drug. It targets fast dividing cells, like cancer cells, and causes these cells to die. This medicine is used to treat many types of cancers like breast cancer, certain stomach cancers, head and neck cancer, lung cancer, and prostate cancer. This medicine may be used for other purposes; ask your health care provider or pharmacist if you have questions. COMMON BRAND NAME(S): Docefrez, Taxotere What should I tell my health care provider before I take this medicine? They need to know if you have any of these conditions: -infection (especially a virus infection such as chickenpox, cold sores, or herpes) -liver disease -low blood counts, like low white cell, platelet, or red cell counts -an unusual or allergic reaction to docetaxel, polysorbate 80, other chemotherapy agents, other medicines, foods, dyes, or preservatives -pregnant or trying to get pregnant -breast-feeding How should I use this medicine? This drug is given as an infusion into a vein. It is administered in a hospital or clinic by a specially trained health care professional. Talk to your pediatrician regarding the use of this medicine in children. Special care may be needed. Overdosage: If you think you have taken too much of this medicine contact a poison control center or emergency room at once. NOTE: This medicine is only for you. Do not share this medicine with others. What if I miss a dose? It is important not to miss your dose. Call your doctor or health care professional if you are unable to keep an appointment. What may interact with this  medicine? -cyclosporine -erythromycin -ketoconazole -medicines to increase blood counts like filgrastim, pegfilgrastim, sargramostim -vaccines Talk to your doctor or health care professional before taking any of these medicines: -acetaminophen -aspirin -ibuprofen -ketoprofen -naproxen This list may not describe all possible interactions. Give your health care provider a list of all the medicines, herbs, non-prescription drugs, or dietary supplements you use. Also tell them if you smoke, drink alcohol, or use illegal drugs. Some items may interact with your medicine. What should I watch for while using this medicine? Your condition will be monitored carefully while you are receiving this medicine. You will need important blood work done while you are taking this medicine. This drug may make you feel generally unwell. This is not uncommon, as chemotherapy can affect healthy cells as well as cancer cells. Report any side effects. Continue your course of treatment even though you feel ill unless your doctor tells you to stop. In some cases, you may be given additional medicines to help with side effects. Follow  all directions for their use. Call your doctor or health care professional for advice if you get a fever, chills or sore throat, or other symptoms of a cold or flu. Do not treat yourself. This drug decreases your body's ability to fight infections. Try to avoid being around people who are sick. This medicine may increase your risk to bruise or bleed. Call your doctor or health care professional if you notice any unusual bleeding. Be careful brushing and flossing your teeth or using a toothpick because you may get an infection or bleed more easily. If you have any dental work done, tell your dentist you are receiving this medicine. Avoid taking products that contain aspirin, acetaminophen, ibuprofen, naproxen, or ketoprofen unless instructed by your doctor. These medicines may hide a  fever. This medicine contains an alcohol in the product. You may get drowsy or dizzy. Do not drive, use machinery, or do anything that needs mental alertness until you know how this medicine affects you. Do not stand or sit up quickly, especially if you are an older patient. This reduces the risk of dizzy or fainting spells. Avoid alcoholic drinks Do not become pregnant while taking this medicine. Women should inform their doctor if they wish to become pregnant or think they might be pregnant. There is a potential for serious side effects to an unborn child. Talk to your health care professional or pharmacist for more information. Do not breast-feed an infant while taking this medicine. What side effects may I notice from receiving this medicine? Side effects that you should report to your doctor or health care professional as soon as possible: -allergic reactions like skin rash, itching or hives, swelling of the face, lips, or tongue -low blood counts - This drug may decrease the number of white blood cells, red blood cells and platelets. You may be at increased risk for infections and bleeding. -signs of infection - fever or chills, cough, sore throat, pain or difficulty passing urine -signs of decreased platelets or bleeding - bruising, pinpoint red spots on the skin, black, tarry stools, nosebleeds -signs of decreased red blood cells - unusually weak or tired, fainting spells, lightheadedness -breathing problems -fast or irregular heartbeat -low blood pressure -mouth sores -nausea and vomiting -pain, swelling, redness or irritation at the injection site -pain, tingling, numbness in the hands or feet -swelling of the ankle, feet, hands -weight gain Side effects that usually do not require medical attention (report to your prescriber or health care professional if they continue or are bothersome): -bone pain -complete hair loss including hair on your head, underarms, pubic hair, eyebrows, and  eyelashes -diarrhea -excessive tearing -changes in the color of fingernails -loosening of the fingernails -nausea -muscle pain -red flush to skin -sweating -weak or tired This list may not describe all possible side effects. Call your doctor for medical advice about side effects. You may report side effects to FDA at 1-800-FDA-1088. Where should I keep my medicine? This drug is given in a hospital or clinic and will not be stored at home. NOTE: This sheet is a summary. It may not cover all possible information. If you have questions about this medicine, talk to your doctor, pharmacist, or health care provider.  2015, Elsevier/Gold Standard. (2013-03-26 22:21:02)

## 2014-12-20 NOTE — Progress Notes (Signed)
Hematology and Oncology Follow Up Visit  Derek Campos [id number] 1971-03-04 44 y.o. 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 He had a total weekend. He began to have some vomiting. He says Phenergan helps him. He was not taking it.  He says his tongue is doing better. He has less pain with his tongue area.  He is using his feeding tube 3 times a day. His weight is down a pound since we last saw him. I am a troubled by this.   He's had no bleeding. He's had no fever. If he says when he gets the Neulasta, this makes him hurt quite a bit and it makes him sick. We will try to hold on the Neulasta in the future.  His skin has done well. He really has had very little skin toxicity from the Erbitux.  Overall, his performance status is ECOG 2.   Medications:  Current outpatient prescriptions:  .  Alum & Mag Hydroxide-Simeth (MAGIC MOUTHWASH W/LIDOCAINE) SOLN, Take 15 mLs by mouth 4 (four) times daily as needed for mouth pain., Disp: 500 mL, Rfl: 2 .  fentaNYL (DURAGESIC - DOSED MCG/HR) 75 MCG/HR, Place 2 patches (150 mcg total) onto the skin every 3 (three) days., Disp: 20 patch, Rfl: 0 .  gabapentin (NEURONTIN) 600 MG tablet, Take 1 tablet (600 mg total) by mouth 3 (three) times daily., Disp: 90 tablet, Rfl: 4 .  oxycodone (ROXICODONE) 30 MG immediate release tablet, Take 1-1 1/2 tablets, if needed, every 4 hrs for pain, Disp: 180 tablet, Rfl: 0 .  promethazine (PHENERGAN) 25 MG tablet, Take 1 tablet (25 mg total) by mouth every 6 (six) hours as needed for nausea or vomiting., Disp: 90 tablet, Rfl: 0 .  ranitidine (ZANTAC) 150 MG tablet, Take 1 tablet (150 mg total) by mouth 2 (two) times daily. Crush and put down  feeding tube., Disp: 60 tablet, Rfl: 3  Allergies:  Allergies  Allergen Reactions  . Other     Cat Gut Stitches - unknown reaction.Told by parents.    Past Medical History, Surgical history, Social history, and Family History were reviewed and updated.  Review of Systems: As above  Physical Exam:  height is 5\' 1"  (1.549 m) and weight is 112 lb (50.803 kg). His oral temperature is 98 F (36.7 C). His blood pressure is 98/80 and his pulse is 74. His respiration is 16.   Wt Readings from Last 3 Encounters:  12/20/14 112 lb (50.803 kg)  12/02/14 120 lb (54.432 kg)  11/29/14 120 lb (54.432 kg)     [name]. He is in no obvious distress. Head and neck exam shows his tongue to be significantly less inflamed and disfigured. There is not as much obvious tumor that is noted on the tongue. . 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 Surprisingly there is no adenopathy in his neck. Pupils react appropriately. Lungs are clear. Cardiac exam regular rate and rhythm with no murmurs, rubs or bruits. Abdomen is soft. Has a feeding tube as intact. There is no fluid wave. There is no palpable liver or spleen tip. Extremities shows no clubbing, cyanosis or edema. He has good strength in his extremities. He has good range  of motion of his joints. Skin exam shows no rashes, ecchymoses or petechia. Neurological exam is nonfocal.  Lab Results  Component Value Date   WBC 2.4* 12/06/2014   HGB 8.2* 12/06/2014   HCT 24.6* 12/06/2014   MCV 93 12/06/2014   PLT 104* 12/06/2014     Chemistry      Component Value Date/Time   NA 139 12/15/2014 1002   NA 139 12/03/2014 0629   NA 137 10/25/2014 1018   K 4.2 12/15/2014 1002   K 3.7 12/03/2014 0629   K 4.1 10/25/2014 1018   CL 97* 12/15/2014 1002   CL 103 12/03/2014 0629   CO2 31 12/15/2014 1002   CO2 26 12/03/2014 0629   CO2 26 10/25/2014 1018   BUN 17 12/15/2014 1002   BUN 18 12/03/2014 0629   BUN 15.3 10/25/2014 1018   CREATININE  0.8 12/15/2014 1002   CREATININE 0.83 12/03/2014 0629   CREATININE 0.7 10/25/2014 1018      Component Value Date/Time   CALCIUM 9.4 12/15/2014 1002   CALCIUM 8.6* 12/03/2014 0629   CALCIUM 9.3 10/25/2014 1018   ALKPHOS 65 12/15/2014 1002   ALKPHOS 110 12/02/2014 1504   ALKPHOS 74 10/25/2014 1018   AST 30 12/15/2014 1002   AST 29 12/02/2014 1504   AST 16 10/25/2014 1018   ALT 22 12/15/2014 1002   ALT 18 12/02/2014 1504   ALT 12 10/25/2014 1018   BILITOT 0.60 12/15/2014 1002   BILITOT 0.9 12/02/2014 1504   BILITOT 0.20 10/25/2014 1018         Impression and Plan: Derek Campos is a 44 year old white gentleman with metastatic head and neck cancer.  His tongue looks a whole lot better. However, I am really bothered by his weight loss.  He says that the Neulasta makes him sick. As such, we will try to hold on the Neulasta next time he gets treated.  He definitely needs IV fluids.  We will continue him on treatment.  I will add Xgeva. He has the bone lesion. Hopefully, Derek Campos will help with the metastasis. Thankfully, he is not that symptomatic from this.  I will give him some IV fluids. We will Campos what his labs are.  We may have to follow him closely for right now.           Volanda Napoleon, MD 8/8/20168:24 AM

## 2014-12-21 LAB — PREALBUMIN: Prealbumin: 19 mg/dL — ABNORMAL LOW (ref 21–43)

## 2014-12-27 ENCOUNTER — Other Ambulatory Visit (HOSPITAL_BASED_OUTPATIENT_CLINIC_OR_DEPARTMENT_OTHER): Payer: No Typology Code available for payment source

## 2014-12-27 ENCOUNTER — Other Ambulatory Visit: Payer: Self-pay

## 2014-12-27 ENCOUNTER — Telehealth: Payer: Self-pay

## 2014-12-27 ENCOUNTER — Encounter: Payer: Self-pay | Admitting: Family

## 2014-12-27 ENCOUNTER — Ambulatory Visit: Payer: No Typology Code available for payment source

## 2014-12-27 ENCOUNTER — Ambulatory Visit (HOSPITAL_BASED_OUTPATIENT_CLINIC_OR_DEPARTMENT_OTHER): Payer: No Typology Code available for payment source | Admitting: Family

## 2014-12-27 VITALS — BP 91/77 | HR 80 | Temp 97.9°F | Resp 20 | Ht 70.0 in | Wt 118.0 lb

## 2014-12-27 DIAGNOSIS — C069 Malignant neoplasm of mouth, unspecified: Secondary | ICD-10-CM

## 2014-12-27 DIAGNOSIS — C799 Secondary malignant neoplasm of unspecified site: Secondary | ICD-10-CM

## 2014-12-27 DIAGNOSIS — C029 Malignant neoplasm of tongue, unspecified: Secondary | ICD-10-CM

## 2014-12-27 DIAGNOSIS — D509 Iron deficiency anemia, unspecified: Secondary | ICD-10-CM

## 2014-12-27 DIAGNOSIS — K909 Intestinal malabsorption, unspecified: Secondary | ICD-10-CM

## 2014-12-27 DIAGNOSIS — C609 Malignant neoplasm of penis, unspecified: Secondary | ICD-10-CM

## 2014-12-27 LAB — CMP (CANCER CENTER ONLY)
ALK PHOS: 64 U/L (ref 26–84)
ALT: 13 U/L (ref 10–47)
AST: 21 U/L (ref 11–38)
Albumin: 3.5 g/dL (ref 3.3–5.5)
BILIRUBIN TOTAL: 0.5 mg/dL (ref 0.20–1.60)
BUN: 24 mg/dL — AB (ref 7–22)
CHLORIDE: 94 meq/L — AB (ref 98–108)
CO2: 32 mEq/L (ref 18–33)
CREATININE: 0.6 mg/dL (ref 0.6–1.2)
Calcium: 9.4 mg/dL (ref 8.0–10.3)
Glucose, Bld: 100 mg/dL (ref 73–118)
Potassium: 4 mEq/L (ref 3.3–4.7)
SODIUM: 138 meq/L (ref 128–145)
TOTAL PROTEIN: 6.9 g/dL (ref 6.4–8.1)

## 2014-12-27 LAB — CBC WITH DIFFERENTIAL (CANCER CENTER ONLY)
BASO#: 0 10*3/uL (ref 0.0–0.2)
BASO%: 1.1 % (ref 0.0–2.0)
EOS%: 1.1 % (ref 0.0–7.0)
Eosinophils Absolute: 0 10*3/uL (ref 0.0–0.5)
HCT: 28.3 % — ABNORMAL LOW (ref 38.7–49.9)
HGB: 9.5 g/dL — ABNORMAL LOW (ref 13.0–17.1)
LYMPH#: 0.3 10*3/uL — ABNORMAL LOW (ref 0.9–3.3)
LYMPH%: 28.1 % (ref 14.0–48.0)
MCH: 32.3 pg (ref 28.0–33.4)
MCHC: 33.6 g/dL (ref 32.0–35.9)
MCV: 96 fL (ref 82–98)
MONO#: 0.1 10*3/uL (ref 0.1–0.9)
MONO%: 5.6 % (ref 0.0–13.0)
NEUT#: 0.6 10*3/uL — ABNORMAL LOW (ref 1.5–6.5)
NEUT%: 64.1 % (ref 40.0–80.0)
PLATELETS: 159 10*3/uL (ref 145–400)
RBC: 2.94 10*6/uL — AB (ref 4.20–5.70)
RDW: 18.1 % — ABNORMAL HIGH (ref 11.1–15.7)
WBC: 0.9 10*3/uL — CL (ref 4.0–10.0)

## 2014-12-27 LAB — MAGNESIUM: MAGNESIUM: 1.4 mg/dL — AB (ref 1.5–2.5)

## 2014-12-27 LAB — LACTATE DEHYDROGENASE: LDH: 152 U/L (ref 94–250)

## 2014-12-27 MED ORDER — OXYCODONE HCL 30 MG PO TABS: ORAL_TABLET | ORAL | Status: DC

## 2014-12-27 NOTE — Telephone Encounter (Signed)
Laverna Peace, NP aware of critical low white count. dph

## 2014-12-27 NOTE — Progress Notes (Signed)
Hematology and Oncology Follow Up Visit  Derek Campos [id number] 1971/01/03 44 y.o. 45 y.o. 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 He is feeling a little better but still tired. His mouth is looking much better. He is still having some pain but this is a little better.  He didn't get Neulasta with his treatment last week because it caused him n/v and pain. His WBC count today is 0.9. His Hgb is also down at 9.5.  He denies fever, chills, n/v, cough, rash, dizziness, SOB, chest pain, palpitations, abdominal pain, constipation, diarrhea, blood in urine or stool.  He still has some submandibular lymphadenopathy.  He is hydrating himself through his J-tube using water and Pedialyte. He is also doing feedings 3 times daily. His weight is up 6 lbs this week to 118. He has had no swelling, tenderness, numbness or tingling in his extremities. No new aches or pains. He denies having any "boney" pain.   Medications:    Medication List       This list is accurate as of: 12/27/14  9:11 AM.  Always use your most recent med list.               fentaNYL 75 MCG/HR  Commonly known as:  DURAGESIC - dosed mcg/hr  Place 2 patches (150 mcg total) onto the skin every 3 (three) days.     gabapentin 600 MG tablet  Commonly known as:  NEURONTIN  Take 1 tablet (600 mg total) by mouth 3 (three) times daily.     magic mouthwash w/lidocaine Soln  Take 15 mLs by mouth 4 (four) times daily as needed for mouth pain.     oxycodone 30 MG immediate release tablet  Commonly known as:  ROXICODONE  Take 1-1 1/2 tablets, if needed, every 4 hrs for pain     promethazine 25 MG tablet  Commonly known as:  PHENERGAN  Take 1 tablet (25 mg total) by mouth every 6  (six) hours as needed for nausea or vomiting.     ranitidine 150 MG tablet  Commonly known as:  ZANTAC  Take 1 tablet (150 mg total) by mouth 2 (two) times daily. Crush and put down feeding tube.        Allergies:  Allergies  Allergen Reactions  . Other     Cat Gut Stitches - unknown reaction.Told by parents.    Past Medical History, Surgical history, Social history, and Family History were reviewed and updated.  Review of Systems: All other 10 point review of systems is negative.   Physical Exam:  vitals were not taken for this visit.  Wt Readings from Last 3 Encounters:  12/20/14 112 lb (50.803 kg)  12/02/14 120 lb (54.432 kg)  11/29/14 120 lb (54.432 kg)    Ocular: Sclerae unicteric, pupils equal, round and reactive to light Ear-nose-throat: Oropharynx clear, dentition fair Lymphatic: No cervical or supraclavicular adenopathy Lungs no rales or rhonchi, good excursion bilaterally Heart regular rate and rhythm, no murmur appreciated Abd soft, nontender, positive bowel sounds MSK no focal spinal tenderness, no joint edema Neuro: non-focal, well-oriented, appropriate affect  Lab Results  Component Value Date   WBC 4.7 12/20/2014   HGB 12.2* 12/20/2014   HCT 35.7* 12/20/2014   MCV 94 12/20/2014  PLT 126* 12/20/2014   Lab Results  Component Value Date   FERRITIN 394* 12/20/2014   IRON 42 12/20/2014   TIBC 261 12/20/2014   UIBC 219 12/20/2014   IRONPCTSAT 16* 12/20/2014   Lab Results  Component Value Date   RETICCTPCT 1.1 11/02/2014   RBC 3.78* 12/20/2014   No results found for: KPAFRELGTCHN, LAMBDASER, KAPLAMBRATIO No results found for: Kandis Cocking, IGMSERUM No results found for: Odetta Pink, SPEI   Chemistry      Component Value Date/Time   NA 141 12/20/2014 0749   NA 139 12/03/2014 0629   NA 137 10/25/2014 1018   K 3.9 12/20/2014 0749   K 3.7 12/03/2014 0629   K 4.1 10/25/2014 1018   CL  101 12/20/2014 0749   CL 103 12/03/2014 0629   CO2 31 12/20/2014 0749   CO2 26 12/03/2014 0629   CO2 26 10/25/2014 1018   BUN 14 12/20/2014 0749   BUN 18 12/03/2014 0629   BUN 15.3 10/25/2014 1018   CREATININE 1.0 12/20/2014 0749   CREATININE 0.83 12/03/2014 0629   CREATININE 0.7 10/25/2014 1018      Component Value Date/Time   CALCIUM 9.8 12/20/2014 0749   CALCIUM 8.6* 12/03/2014 0629   CALCIUM 9.3 10/25/2014 1018   ALKPHOS 56 12/20/2014 0749   ALKPHOS 110 12/02/2014 1504   ALKPHOS 74 10/25/2014 1018   AST 24 12/20/2014 0749   AST 29 12/02/2014 1504   AST 16 10/25/2014 1018   ALT 16 12/20/2014 0749   ALT 18 12/02/2014 1504   ALT 12 10/25/2014 1018   BILITOT 0.80 12/20/2014 0749   BILITOT 0.9 12/02/2014 1504   BILITOT 0.20 10/25/2014 1018     Impression and Plan: Derek Campos is a 44 year old white gentleman with metastatic head and neck cancer. He is feeling a little better today and his tongue is looking improved.  We held his Neulasta last week since it had made him sick. His WBC count today is 0.9. His HGb is also down at 9.5. We will hold his treatment today and resume next Monday if his labs have improved.  He has his appointment schedule.  I did refill his Oxycodone today.  He knows to contact us with any questions or concerns. We can certainly bring him in sooner if need be.   Eliezer Bottom, NP 8/15/20169:11 AM

## 2015-01-03 ENCOUNTER — Ambulatory Visit (HOSPITAL_BASED_OUTPATIENT_CLINIC_OR_DEPARTMENT_OTHER): Payer: No Typology Code available for payment source

## 2015-01-03 ENCOUNTER — Other Ambulatory Visit (HOSPITAL_BASED_OUTPATIENT_CLINIC_OR_DEPARTMENT_OTHER): Payer: No Typology Code available for payment source

## 2015-01-03 ENCOUNTER — Other Ambulatory Visit: Payer: Self-pay | Admitting: *Deleted

## 2015-01-03 VITALS — BP 98/64 | HR 77 | Temp 98.2°F | Resp 18 | Wt 116.0 lb

## 2015-01-03 DIAGNOSIS — C760 Malignant neoplasm of head, face and neck: Secondary | ICD-10-CM

## 2015-01-03 DIAGNOSIS — Z5112 Encounter for antineoplastic immunotherapy: Secondary | ICD-10-CM

## 2015-01-03 DIAGNOSIS — C799 Secondary malignant neoplasm of unspecified site: Secondary | ICD-10-CM

## 2015-01-03 DIAGNOSIS — C069 Malignant neoplasm of mouth, unspecified: Secondary | ICD-10-CM

## 2015-01-03 DIAGNOSIS — C029 Malignant neoplasm of tongue, unspecified: Secondary | ICD-10-CM

## 2015-01-03 LAB — CMP (CANCER CENTER ONLY)
ALT(SGPT): 18 U/L (ref 10–47)
AST: 20 U/L (ref 11–38)
Albumin: 3.1 g/dL — ABNORMAL LOW (ref 3.3–5.5)
Alkaline Phosphatase: 69 U/L (ref 26–84)
BUN: 17 mg/dL (ref 7–22)
CHLORIDE: 94 meq/L — AB (ref 98–108)
CO2: 33 meq/L (ref 18–33)
CREATININE: 0.8 mg/dL (ref 0.6–1.2)
Calcium: 9.3 mg/dL (ref 8.0–10.3)
Glucose, Bld: 89 mg/dL (ref 73–118)
Potassium: 4.1 mEq/L (ref 3.3–4.7)
SODIUM: 137 meq/L (ref 128–145)
TOTAL PROTEIN: 6.9 g/dL (ref 6.4–8.1)
Total Bilirubin: 0.3 mg/dl (ref 0.20–1.60)

## 2015-01-03 LAB — CBC WITH DIFFERENTIAL (CANCER CENTER ONLY)
BASO#: 0 10*3/uL (ref 0.0–0.2)
BASO%: 0.5 % (ref 0.0–2.0)
EOS ABS: 0 10*3/uL (ref 0.0–0.5)
EOS%: 0 % (ref 0.0–7.0)
HCT: 27.1 % — ABNORMAL LOW (ref 38.7–49.9)
HGB: 8.9 g/dL — ABNORMAL LOW (ref 13.0–17.1)
LYMPH#: 0.3 10*3/uL — ABNORMAL LOW (ref 0.9–3.3)
LYMPH%: 14.8 % (ref 14.0–48.0)
MCH: 31.9 pg (ref 28.0–33.4)
MCHC: 32.8 g/dL (ref 32.0–35.9)
MCV: 97 fL (ref 82–98)
MONO#: 0.4 10*3/uL (ref 0.1–0.9)
MONO%: 23.6 % — AB (ref 0.0–13.0)
NEUT#: 1.1 10*3/uL — ABNORMAL LOW (ref 1.5–6.5)
NEUT%: 61.1 % (ref 40.0–80.0)
PLATELETS: 172 10*3/uL (ref 145–400)
RBC: 2.79 10*6/uL — AB (ref 4.20–5.70)
RDW: 17.5 % — ABNORMAL HIGH (ref 11.1–15.7)
WBC: 1.8 10*3/uL — AB (ref 4.0–10.0)

## 2015-01-03 LAB — MAGNESIUM (CC13): Magnesium: 1.7 mg/dl (ref 1.5–2.5)

## 2015-01-03 MED ORDER — SODIUM CHLORIDE 0.9 % IV SOLN
Freq: Once | INTRAVENOUS | Status: AC
  Administered 2015-01-03: 12:00:00 via INTRAVENOUS

## 2015-01-03 MED ORDER — HYDROMORPHONE HCL 1 MG/ML IJ SOLN
2.0000 mg | Freq: Once | INTRAMUSCULAR | Status: AC
  Administered 2015-01-03: 2 mg via INTRAVENOUS

## 2015-01-03 MED ORDER — HYDROMORPHONE HCL 1 MG/ML IJ SOLN
INTRAMUSCULAR | Status: AC
  Filled 2015-01-03: qty 2

## 2015-01-03 MED ORDER — DIPHENHYDRAMINE HCL 50 MG/ML IJ SOLN
50.0000 mg | Freq: Once | INTRAMUSCULAR | Status: AC
  Administered 2015-01-03: 50 mg via INTRAVENOUS

## 2015-01-03 MED ORDER — CETUXIMAB CHEMO IV INJECTION 200 MG/100ML
250.0000 mg/m2 | Freq: Once | INTRAVENOUS | Status: AC
  Administered 2015-01-03: 400 mg via INTRAVENOUS
  Filled 2015-01-03: qty 200

## 2015-01-03 MED ORDER — DIPHENHYDRAMINE HCL 50 MG/ML IJ SOLN
INTRAMUSCULAR | Status: AC
  Filled 2015-01-03: qty 1

## 2015-01-03 MED ORDER — LIDOCAINE VISCOUS 2 % MT SOLN: 20.0000 mL | OROMUCOSAL | Status: DC | PRN

## 2015-01-03 NOTE — Progress Notes (Signed)
Ok to treat per Dr. Ennever 

## 2015-01-03 NOTE — Patient Instructions (Signed)
Cetuximab injection What is this medicine? CETUXIMAB (se TUX i mab) is a chemotherapy drug. It targets a specific protein within cancer cells and stops the cells from growing. It is used to treat colorectal cancer and head and neck cancer. This medicine may be used for other purposes; ask your health care provider or pharmacist if you have questions. COMMON BRAND NAME(S): Erbitux What should I tell my health care provider before I take this medicine? They need to know if you have any of these conditions: -heart disease -history of irregular heartbeat -history of low levels of calcium, magnesium, or potassium in the blood -lung or breathing disease, like asthma -an unusual or allergic reaction to cetuximab, other medicines, foods, dyes, or preservatives -pregnant or trying to get pregnant -breast-feeding How should I use this medicine? This drug is given as an infusion into a vein. It is administered in a hospital or clinic by a specially trained health care professional. Talk to your pediatrician regarding the use of this medicine in children. Special care may be needed. Overdosage: If you think you have taken too much of this medicine contact a poison control center or emergency room at once. NOTE: This medicine is only for you. Do not share this medicine with others. What if I miss a dose? It is important not to miss your dose. Call your doctor or health care professional if you are unable to keep an appointment. What may interact with this medicine? Interactions are not expected. This list may not describe all possible interactions. Give your health care provider a list of all the medicines, herbs, non-prescription drugs, or dietary supplements you use. Also tell them if you smoke, drink alcohol, or use illegal drugs. Some items may interact with your medicine. What should I watch for while using this medicine? Visit your doctor or health care professional for regular checks on your  progress. This drug may make you feel generally unwell. This is not uncommon, as chemotherapy can affect healthy cells as well as cancer cells. Report any side effects. Continue your course of treatment even though you feel ill unless your doctor tells you to stop. This medicine can make you more sensitive to the sun. Keep out of the sun while taking this medicine and for 2 months after the last dose. If you cannot avoid being in the sun, wear protective clothing and use sunscreen. Do not use sun lamps or tanning beds/booths. You may need blood work done while you are taking this medicine. In some cases, you may be given additional medicines to help with side effects. Follow all directions for their use. Call your doctor or health care professional for advice if you get a fever, chills or sore throat, or other symptoms of a cold or flu. Do not treat yourself. This drug decreases your body's ability to fight infections. Try to avoid being around people who are sick. Avoid taking products that contain aspirin, acetaminophen, ibuprofen, naproxen, or ketoprofen unless instructed by your doctor. These medicines may hide a fever. Do not become pregnant while taking this medicine. Women should inform their doctor if they wish to become pregnant or think they might be pregnant. There is a potential for serious side effects to an unborn child. Use adequate birth control methods. Avoid pregnancy for at least 6 months after your last dose. Talk to your health care professional or pharmacist for more information. Do not breast-feed an infant while taking this medicine or during the 2 months after your last   dose. What side effects may I notice from receiving this medicine? Side effects that you should report to your doctor or health care professional as soon as possible: -allergic reactions like skin rash, itching or hives, swelling of the face, lips, or tongue -breathing problems -changes in vision -fast, irregular  heartbeat -feeling faint or lightheaded, falls -fever, chills -mouth sores -redness, blistering, peeling or loosening of the skin, including inside the mouth -trouble passing urine or change in the amount of urine -unusually weak or tired Side effects that usually do not require medical attention (report to your doctor or health care professional if they continue or are bothersome): -changes in skin like acne, cracks, skin dryness -constipation -diarrhea -headache -nail changes -nausea, vomiting -stomach upset -weight loss This list may not describe all possible side effects. Call your doctor for medical advice about side effects. You may report side effects to FDA at 1-800-FDA-1088. Where should I keep my medicine? This drug is given in a hospital or clinic and will not be stored at home. NOTE: This sheet is a summary. It may not cover all possible information. If you have questions about this medicine, talk to your doctor, pharmacist, or health care provider.  2015, [employer]. (2013-08-12 16:14:34)  

## 2015-01-10 ENCOUNTER — Ambulatory Visit (HOSPITAL_BASED_OUTPATIENT_CLINIC_OR_DEPARTMENT_OTHER): Payer: No Typology Code available for payment source

## 2015-01-10 ENCOUNTER — Other Ambulatory Visit (HOSPITAL_BASED_OUTPATIENT_CLINIC_OR_DEPARTMENT_OTHER): Payer: No Typology Code available for payment source

## 2015-01-10 VITALS — BP 97/67 | HR 66 | Temp 97.7°F | Resp 18

## 2015-01-10 DIAGNOSIS — Z5112 Encounter for antineoplastic immunotherapy: Secondary | ICD-10-CM

## 2015-01-10 DIAGNOSIS — C069 Malignant neoplasm of mouth, unspecified: Secondary | ICD-10-CM

## 2015-01-10 DIAGNOSIS — C760 Malignant neoplasm of head, face and neck: Secondary | ICD-10-CM

## 2015-01-10 DIAGNOSIS — C799 Secondary malignant neoplasm of unspecified site: Secondary | ICD-10-CM

## 2015-01-10 LAB — CBC WITH DIFFERENTIAL (CANCER CENTER ONLY)
BASO#: 0 10*3/uL (ref 0.0–0.2)
BASO%: 0.6 % (ref 0.0–2.0)
EOS%: 0 % (ref 0.0–7.0)
Eosinophils Absolute: 0 10*3/uL (ref 0.0–0.5)
HCT: 30.2 % — ABNORMAL LOW (ref 38.7–49.9)
HGB: 10.1 g/dL — ABNORMAL LOW (ref 13.0–17.1)
LYMPH#: 0.4 10*3/uL — ABNORMAL LOW (ref 0.9–3.3)
LYMPH%: 8.5 % — AB (ref 14.0–48.0)
MCH: 32.9 pg (ref 28.0–33.4)
MCHC: 33.4 g/dL (ref 32.0–35.9)
MCV: 98 fL (ref 82–98)
MONO#: 0.4 10*3/uL (ref 0.1–0.9)
MONO%: 7.4 % (ref 0.0–13.0)
NEUT#: 3.9 10*3/uL (ref 1.5–6.5)
NEUT%: 83.5 % — ABNORMAL HIGH (ref 40.0–80.0)
PLATELETS: 155 10*3/uL (ref 145–400)
RBC: 3.07 10*6/uL — AB (ref 4.20–5.70)
RDW: 18.4 % — ABNORMAL HIGH (ref 11.1–15.7)
WBC: 4.7 10*3/uL (ref 4.0–10.0)

## 2015-01-10 LAB — CMP (CANCER CENTER ONLY)
ALK PHOS: 65 U/L (ref 26–84)
ALT: 22 U/L (ref 10–47)
AST: 24 U/L (ref 11–38)
Albumin: 3.6 g/dL (ref 3.3–5.5)
BILIRUBIN TOTAL: 0.4 mg/dL (ref 0.20–1.60)
BUN: 20 mg/dL (ref 7–22)
CO2: 30 mEq/L (ref 18–33)
CREATININE: 0.8 mg/dL (ref 0.6–1.2)
Calcium: 9.4 mg/dL (ref 8.0–10.3)
Chloride: 94 mEq/L — ABNORMAL LOW (ref 98–108)
Glucose, Bld: 75 mg/dL (ref 73–118)
Potassium: 3.6 mEq/L (ref 3.3–4.7)
SODIUM: 136 meq/L (ref 128–145)
TOTAL PROTEIN: 7.3 g/dL (ref 6.4–8.1)

## 2015-01-10 LAB — MAGNESIUM (CC13): MAGNESIUM: 1.8 mg/dL (ref 1.5–2.5)

## 2015-01-10 MED ORDER — DIPHENHYDRAMINE HCL 50 MG/ML IJ SOLN
50.0000 mg | Freq: Once | INTRAMUSCULAR | Status: AC
  Administered 2015-01-10: 50 mg via INTRAVENOUS

## 2015-01-10 MED ORDER — CETUXIMAB CHEMO IV INJECTION 200 MG/100ML
250.0000 mg/m2 | Freq: Once | INTRAVENOUS | Status: AC
  Administered 2015-01-10: 400 mg via INTRAVENOUS
  Filled 2015-01-10: qty 200

## 2015-01-10 MED ORDER — SODIUM CHLORIDE 0.9 % IV SOLN
Freq: Once | INTRAVENOUS | Status: AC
  Administered 2015-01-10: 10:00:00 via INTRAVENOUS

## 2015-01-10 MED ORDER — DIPHENHYDRAMINE HCL 50 MG/ML IJ SOLN
INTRAMUSCULAR | Status: AC
  Filled 2015-01-10: qty 1

## 2015-01-10 NOTE — Patient Instructions (Signed)
Cetuximab injection What is this medicine? CETUXIMAB (se TUX i mab) is a chemotherapy drug. It targets a specific protein within cancer cells and stops the cells from growing. It is used to treat colorectal cancer and head and neck cancer. This medicine may be used for other purposes; ask your health care provider or pharmacist if you have questions. COMMON BRAND NAME(S): Erbitux What should I tell my health care provider before I take this medicine? They need to know if you have any of these conditions: -heart disease -history of irregular heartbeat -history of low levels of calcium, magnesium, or potassium in the blood -lung or breathing disease, like asthma -an unusual or allergic reaction to cetuximab, other medicines, foods, dyes, or preservatives -pregnant or trying to get pregnant -breast-feeding How should I use this medicine? This drug is given as an infusion into a vein. It is administered in a hospital or clinic by a specially trained health care professional. Talk to your pediatrician regarding the use of this medicine in children. Special care may be needed. Overdosage: If you think you have taken too much of this medicine contact a poison control center or emergency room at once. NOTE: This medicine is only for you. Do not share this medicine with others. What if I miss a dose? It is important not to miss your dose. Call your doctor or health care professional if you are unable to keep an appointment. What may interact with this medicine? Interactions are not expected. This list may not describe all possible interactions. Give your health care provider a list of all the medicines, herbs, non-prescription drugs, or dietary supplements you use. Also tell them if you smoke, drink alcohol, or use illegal drugs. Some items may interact with your medicine. What should I watch for while using this medicine? Visit your doctor or health care professional for regular checks on your  progress. This drug may make you feel generally unwell. This is not uncommon, as chemotherapy can affect healthy cells as well as cancer cells. Report any side effects. Continue your course of treatment even though you feel ill unless your doctor tells you to stop. This medicine can make you more sensitive to the sun. Keep out of the sun while taking this medicine and for 2 months after the last dose. If you cannot avoid being in the sun, wear protective clothing and use sunscreen. Do not use sun lamps or tanning beds/booths. You may need blood work done while you are taking this medicine. In some cases, you may be given additional medicines to help with side effects. Follow all directions for their use. Call your doctor or health care professional for advice if you get a fever, chills or sore throat, or other symptoms of a cold or flu. Do not treat yourself. This drug decreases your body's ability to fight infections. Try to avoid being around people who are sick. Avoid taking products that contain aspirin, acetaminophen, ibuprofen, naproxen, or ketoprofen unless instructed by your doctor. These medicines may hide a fever. Do not become pregnant while taking this medicine. Women should inform their doctor if they wish to become pregnant or think they might be pregnant. There is a potential for serious side effects to an unborn child. Use adequate birth control methods. Avoid pregnancy for at least 6 months after your last dose. Talk to your health care professional or pharmacist for more information. Do not breast-feed an infant while taking this medicine or during the 2 months after your last   dose. What side effects may I notice from receiving this medicine? Side effects that you should report to your doctor or health care professional as soon as possible: -allergic reactions like skin rash, itching or hives, swelling of the face, lips, or tongue -breathing problems -changes in vision -fast, irregular  heartbeat -feeling faint or lightheaded, falls -fever, chills -mouth sores -redness, blistering, peeling or loosening of the skin, including inside the mouth -trouble passing urine or change in the amount of urine -unusually weak or tired Side effects that usually do not require medical attention (report to your doctor or health care professional if they continue or are bothersome): -changes in skin like acne, cracks, skin dryness -constipation -diarrhea -headache -nail changes -nausea, vomiting -stomach upset -weight loss This list may not describe all possible side effects. Call your doctor for medical advice about side effects. You may report side effects to FDA at 1-800-FDA-1088. Where should I keep my medicine? This drug is given in a hospital or clinic and will not be stored at home. NOTE: This sheet is a summary. It may not cover all possible information. If you have questions about this medicine, talk to your doctor, pharmacist, or health care provider.  2015, [employer]. (2013-08-12 16:14:34)  

## 2015-01-14 ENCOUNTER — Other Ambulatory Visit: Payer: Self-pay | Admitting: *Deleted

## 2015-01-14 DIAGNOSIS — C069 Malignant neoplasm of mouth, unspecified: Secondary | ICD-10-CM

## 2015-01-18 ENCOUNTER — Ambulatory Visit (HOSPITAL_BASED_OUTPATIENT_CLINIC_OR_DEPARTMENT_OTHER): Payer: No Typology Code available for payment source

## 2015-01-18 ENCOUNTER — Encounter: Payer: Self-pay | Admitting: Family

## 2015-01-18 ENCOUNTER — Other Ambulatory Visit (HOSPITAL_BASED_OUTPATIENT_CLINIC_OR_DEPARTMENT_OTHER): Payer: No Typology Code available for payment source

## 2015-01-18 ENCOUNTER — Ambulatory Visit (HOSPITAL_BASED_OUTPATIENT_CLINIC_OR_DEPARTMENT_OTHER): Payer: No Typology Code available for payment source | Admitting: Family

## 2015-01-18 VITALS — BP 107/82 | HR 78 | Temp 97.8°F | Resp 18 | Ht 70.0 in | Wt 120.0 lb

## 2015-01-18 DIAGNOSIS — C029 Malignant neoplasm of tongue, unspecified: Secondary | ICD-10-CM

## 2015-01-18 DIAGNOSIS — D509 Iron deficiency anemia, unspecified: Secondary | ICD-10-CM

## 2015-01-18 DIAGNOSIS — C760 Malignant neoplasm of head, face and neck: Secondary | ICD-10-CM

## 2015-01-18 DIAGNOSIS — C069 Malignant neoplasm of mouth, unspecified: Secondary | ICD-10-CM

## 2015-01-18 DIAGNOSIS — Z5111 Encounter for antineoplastic chemotherapy: Secondary | ICD-10-CM

## 2015-01-18 DIAGNOSIS — Z5112 Encounter for antineoplastic immunotherapy: Secondary | ICD-10-CM

## 2015-01-18 DIAGNOSIS — K909 Intestinal malabsorption, unspecified: Secondary | ICD-10-CM

## 2015-01-18 DIAGNOSIS — M899 Disorder of bone, unspecified: Secondary | ICD-10-CM

## 2015-01-18 LAB — CBC WITH DIFFERENTIAL (CANCER CENTER ONLY)
BASO#: 0 10*3/uL (ref 0.0–0.2)
BASO%: 0.2 % (ref 0.0–2.0)
EOS%: 0.2 % (ref 0.0–7.0)
Eosinophils Absolute: 0 10*3/uL (ref 0.0–0.5)
HEMATOCRIT: 33 % — AB (ref 38.7–49.9)
HEMOGLOBIN: 10.7 g/dL — AB (ref 13.0–17.1)
LYMPH#: 0.3 10*3/uL — AB (ref 0.9–3.3)
LYMPH%: 7.2 % — ABNORMAL LOW (ref 14.0–48.0)
MCH: 32.8 pg (ref 28.0–33.4)
MCHC: 32.4 g/dL (ref 32.0–35.9)
MCV: 101 fL — ABNORMAL HIGH (ref 82–98)
MONO#: 0.5 10*3/uL (ref 0.1–0.9)
MONO%: 10.5 % (ref 0.0–13.0)
NEUT%: 81.9 % — ABNORMAL HIGH (ref 40.0–80.0)
NEUTROS ABS: 3.7 10*3/uL (ref 1.5–6.5)
Platelets: 253 10*3/uL (ref 145–400)
RBC: 3.26 10*6/uL — AB (ref 4.20–5.70)
RDW: 18 % — ABNORMAL HIGH (ref 11.1–15.7)
WBC: 4.6 10*3/uL (ref 4.0–10.0)

## 2015-01-18 LAB — CMP (CANCER CENTER ONLY)
ALBUMIN: 3.5 g/dL (ref 3.3–5.5)
ALT(SGPT): 19 U/L (ref 10–47)
AST: 27 U/L (ref 11–38)
Alkaline Phosphatase: 59 U/L (ref 26–84)
BILIRUBIN TOTAL: 0.5 mg/dL (ref 0.20–1.60)
BUN, Bld: 18 mg/dL (ref 7–22)
CALCIUM: 9.7 mg/dL (ref 8.0–10.3)
CHLORIDE: 96 meq/L — AB (ref 98–108)
CO2: 31 meq/L (ref 18–33)
CREATININE: 0.7 mg/dL (ref 0.6–1.2)
Glucose, Bld: 105 mg/dL (ref 73–118)
Potassium: 4 mEq/L (ref 3.3–4.7)
SODIUM: 146 meq/L — AB (ref 128–145)
TOTAL PROTEIN: 7.3 g/dL (ref 6.4–8.1)

## 2015-01-18 LAB — MAGNESIUM (CC13): MAGNESIUM: 2.1 mg/dL (ref 1.5–2.5)

## 2015-01-18 MED ORDER — DENOSUMAB 120 MG/1.7ML ~~LOC~~ SOLN
120.0000 mg | Freq: Once | SUBCUTANEOUS | Status: AC
  Administered 2015-01-18: 120 mg via SUBCUTANEOUS
  Filled 2015-01-18: qty 1.7

## 2015-01-18 MED ORDER — SODIUM CHLORIDE 0.9 % IV SOLN
Freq: Once | INTRAVENOUS | Status: AC
  Administered 2015-01-18: 09:00:00 via INTRAVENOUS

## 2015-01-18 MED ORDER — DOCETAXEL CHEMO INJECTION 160 MG/16ML
65.0000 mg/m2 | Freq: Once | INTRAVENOUS | Status: AC
  Administered 2015-01-18: 110 mg via INTRAVENOUS
  Filled 2015-01-18: qty 11

## 2015-01-18 MED ORDER — HYDROMORPHONE HCL 1 MG/ML PO LIQD: 8.0000 mg | ORAL | Status: DC | PRN

## 2015-01-18 MED ORDER — CETUXIMAB CHEMO IV INJECTION 200 MG/100ML
250.0000 mg/m2 | Freq: Once | INTRAVENOUS | Status: AC
  Administered 2015-01-18: 400 mg via INTRAVENOUS
  Filled 2015-01-18: qty 200

## 2015-01-18 MED ORDER — FENTANYL 75 MCG/HR TD PT72: 150.0000 ug | MEDICATED_PATCH | TRANSDERMAL | Status: DC

## 2015-01-18 MED ORDER — SODIUM CHLORIDE 0.9 % IV SOLN
Freq: Once | INTRAVENOUS | Status: AC
  Administered 2015-01-18: 10:00:00 via INTRAVENOUS
  Filled 2015-01-18: qty 5

## 2015-01-18 MED ORDER — DIPHENHYDRAMINE HCL 50 MG/ML IJ SOLN
INTRAMUSCULAR | Status: AC
  Filled 2015-01-18: qty 1

## 2015-01-18 MED ORDER — SODIUM CHLORIDE 0.9 % IV SOLN
550.0000 mg | Freq: Once | INTRAVENOUS | Status: AC
  Administered 2015-01-18: 550 mg via INTRAVENOUS
  Filled 2015-01-18: qty 55

## 2015-01-18 MED ORDER — HYDROMORPHONE HCL 4 MG PO TABS: 4.0000 mg | ORAL_TABLET | ORAL | Status: DC | PRN

## 2015-01-18 MED ORDER — PEGFILGRASTIM 6 MG/0.6ML ~~LOC~~ PSKT
6.0000 mg | PREFILLED_SYRINGE | Freq: Once | SUBCUTANEOUS | Status: DC
Start: 2015-01-18 — End: 2015-01-18
  Filled 2015-01-18: qty 0.6

## 2015-01-18 MED ORDER — DIPHENHYDRAMINE HCL 50 MG/ML IJ SOLN
50.0000 mg | Freq: Once | INTRAMUSCULAR | Status: AC
  Administered 2015-01-18: 50 mg via INTRAVENOUS

## 2015-01-18 MED ORDER — PALONOSETRON HCL INJECTION 0.25 MG/5ML: 0.2500 mg | Freq: Once | INTRAVENOUS | Status: DC

## 2015-01-18 NOTE — Patient Instructions (Signed)
[facility] Discharge Instructions for Patients Receiving Chemotherapy  Today you received the following chemotherapy agents Erbitux, Taxotere and Carboplatin.  To help prevent nausea and vomiting after your treatment, we encourage you to take your nausea medication.   If you develop nausea and vomiting that is not controlled by your nausea medication, call the clinic.   BELOW ARE SYMPTOMS THAT SHOULD BE REPORTED IMMEDIATELY:  *FEVER GREATER THAN 100.5 F  *CHILLS WITH OR WITHOUT FEVER  NAUSEA AND VOMITING THAT IS NOT CONTROLLED WITH YOUR NAUSEA MEDICATION  *UNUSUAL SHORTNESS OF BREATH  *UNUSUAL BRUISING OR BLEEDING  TENDERNESS IN MOUTH AND THROAT WITH OR WITHOUT PRESENCE OF ULCERS  *URINARY PROBLEMS  *BOWEL PROBLEMS  UNUSUAL RASH Items with * indicate a potential emergency and should be followed up as soon as possible.  Feel free to call the clinic you have any questions or concerns. The clinic phone number is [phone].  Please show the [facility] at check-in to the Emergency Department and triage nurse.

## 2015-01-18 NOTE — Progress Notes (Signed)
Hematology and Oncology Follow Up Visit  Mont Jagoda [id number] 1970/10/02 44 y.o. 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 Mulkern is here today for follow-up and cycle 6 of treatment. He is feeling a little better. He is still having a good deal of pain in his tongue. He is requesting a change in his pain medication.  His WBC count has improved. He is now 4.6 and his Hgb is also up at 10.7.  He denies fever, chills, n/v, cough, rash, dizziness, SOB, chest pain, palpitations, abdominal pain, constipation, diarrhea, blood in urine or stool.  He still has some submandibular lymphadenopathy. No other lymphadenopathy noted on exam.  He is still hydrating himself through his J-tube using water and Pedialyte. He does 3 feedings a day and then 1 extra can. His weight is up 4 more lbs to 120.  He has had no swelling, tenderness, numbness or tingling in his extremities. He denies having any "boney" pain. He did "pull a muscle" in his back mowing grass but this is beginning to improve.    Medications:    Medication List       This list is accurate as of: 01/18/15  8:59 AM.  Always use your most recent med list.               fentaNYL 75 MCG/HR  Commonly known as:  DURAGESIC - dosed mcg/hr  Place 2 patches (150 mcg total) onto the skin every 3 (three) days.     gabapentin 600 MG tablet  Commonly known as:  NEURONTIN  Take 1 tablet (600 mg total) by mouth 3 (three) times daily.     lidocaine 2 % solution  Commonly known as:  XYLOCAINE  Use as directed 20 mLs in the mouth or throat every 3 (three) hours as needed for mouth pain.     magic mouthwash w/lidocaine Soln  Take 15 mLs by mouth 4 (four) times daily as needed for mouth pain.     oxycodone 30 MG immediate release  tablet  Commonly known as:  ROXICODONE  Take 1-1 1/2 tablets, if needed, every 4 hrs for pain     promethazine 25 MG tablet  Commonly known as:  PHENERGAN  Take 1 tablet (25 mg total) by mouth every 6 (six) hours as needed for nausea or vomiting.     ranitidine 150 MG tablet  Commonly known as:  ZANTAC  Take 1 tablet (150 mg total) by mouth 2 (two) times daily. Crush and put down feeding tube.        Allergies:  Allergies  Allergen Reactions  . Other     Cat Gut Stitches - unknown reaction.Told by parents.    Past Medical History, Surgical history, Social history, and Family History were reviewed and updated.  Review of Systems: All other 10 point review of systems is negative.   Physical Exam:  height is 5\' 10"  (1.778 m) and weight is 120 lb (54.432 kg). His oral temperature is 97.8 F (36.6 C). His blood pressure is 107/82 and his pulse is 78. His respiration is 18.   Wt Readings from Last 3 Encounters:  01/18/15 120 lb (54.432 kg)  01/03/15 116 lb (52.617 kg)  12/27/14 118 lb (53.524 kg)    Ocular: Sclerae unicteric, pupils equal, round and reactive to  light Ear-nose-throat: Oropharynx clear, dentition fair Lymphatic: No cervical or supraclavicular adenopathy Lungs no rales or rhonchi, good excursion bilaterally Heart regular rate and rhythm, no murmur appreciated Abd soft, nontender, positive bowel sounds MSK no focal spinal tenderness, no joint edema Neuro: non-focal, well-oriented, appropriate affect  Lab Results  Component Value Date   WBC 4.6 01/18/2015   HGB 10.7* 01/18/2015   HCT 33.0* 01/18/2015   MCV 101* 01/18/2015   PLT 253 01/18/2015   Lab Results  Component Value Date   FERRITIN 394* 12/20/2014   IRON 42 12/20/2014   TIBC 261 12/20/2014   UIBC 219 12/20/2014   IRONPCTSAT 16* 12/20/2014   Lab Results  Component Value Date   RETICCTPCT 1.1 11/02/2014   RBC 3.26* 01/18/2015   No results found for: KPAFRELGTCHN, LAMBDASER,  KAPLAMBRATIO No results found for: Kandis Cocking, IGMSERUM No results found for: Odetta Pink, SPEI   Chemistry      Component Value Date/Time   NA 146* 01/18/2015 0755   NA 139 12/03/2014 0629   NA 137 10/25/2014 1018   K 4.0 01/18/2015 0755   K 3.7 12/03/2014 0629   K 4.1 10/25/2014 1018   CL 96* 01/18/2015 0755   CL 103 12/03/2014 0629   CO2 31 01/18/2015 0755   CO2 26 12/03/2014 0629   CO2 26 10/25/2014 1018   BUN 18 01/18/2015 0755   BUN 18 12/03/2014 0629   BUN 15.3 10/25/2014 1018   CREATININE 0.7 01/18/2015 0755   CREATININE 0.83 12/03/2014 0629   CREATININE 0.7 10/25/2014 1018      Component Value Date/Time   CALCIUM 9.7 01/18/2015 0755   CALCIUM 8.6* 12/03/2014 0629   CALCIUM 9.3 10/25/2014 1018   ALKPHOS 59 01/18/2015 0755   ALKPHOS 110 12/02/2014 1504   ALKPHOS 74 10/25/2014 1018   AST 27 01/18/2015 0755   AST 29 12/02/2014 1504   AST 16 10/25/2014 1018   ALT 19 01/18/2015 0755   ALT 18 12/02/2014 1504   ALT 12 10/25/2014 1018   BILITOT 0.50 01/18/2015 0755   BILITOT 0.9 12/02/2014 1504   BILITOT 0.20 10/25/2014 1018     Impression and Plan: Mr. Hengel is a 44 year old white gentleman with metastatic head and neck cancer. He is feeling better and his weight is up but he is still having a good deal of pain in the tongue.  His lab work is improved today. His WBC count is now 4.6 with a Hgb of 10.7.  We will discontinue his oxycodone and start him on dilaudid for pain.  We also refilled his Duragesic patch prescription.  We will proceed with cycle 6 of treatment today as planned.  We will also set him up for scans before his next treatment. He will get his new schedule today.  He knows to contact us with any questions or concerns. We can certainly bring him in sooner if need be.   Eliezer Bottom, NP 9/6/20168:59 AM

## 2015-01-21 ENCOUNTER — Other Ambulatory Visit: Payer: Self-pay | Admitting: *Deleted

## 2015-01-21 DIAGNOSIS — C069 Malignant neoplasm of mouth, unspecified: Secondary | ICD-10-CM

## 2015-01-24 ENCOUNTER — Other Ambulatory Visit (HOSPITAL_BASED_OUTPATIENT_CLINIC_OR_DEPARTMENT_OTHER): Payer: No Typology Code available for payment source

## 2015-01-24 ENCOUNTER — Other Ambulatory Visit: Payer: Self-pay | Admitting: Family

## 2015-01-24 ENCOUNTER — Telehealth: Payer: Self-pay | Admitting: *Deleted

## 2015-01-24 ENCOUNTER — Ambulatory Visit (HOSPITAL_BASED_OUTPATIENT_CLINIC_OR_DEPARTMENT_OTHER): Payer: No Typology Code available for payment source

## 2015-01-24 VITALS — BP 105/77 | HR 82 | Temp 98.1°F | Wt 107.0 lb

## 2015-01-24 DIAGNOSIS — Z5112 Encounter for antineoplastic immunotherapy: Secondary | ICD-10-CM

## 2015-01-24 DIAGNOSIS — C069 Malignant neoplasm of mouth, unspecified: Secondary | ICD-10-CM

## 2015-01-24 DIAGNOSIS — C760 Malignant neoplasm of head, face and neck: Secondary | ICD-10-CM

## 2015-01-24 DIAGNOSIS — E876 Hypokalemia: Secondary | ICD-10-CM

## 2015-01-24 LAB — CBC WITH DIFFERENTIAL (CANCER CENTER ONLY)
BASO#: 0 10*3/uL (ref 0.0–0.2)
BASO%: 0.6 % (ref 0.0–2.0)
EOS ABS: 0 10*3/uL (ref 0.0–0.5)
EOS%: 0.6 % (ref 0.0–7.0)
HEMATOCRIT: 37.7 % — AB (ref 38.7–49.9)
HEMOGLOBIN: 12.6 g/dL — AB (ref 13.0–17.1)
LYMPH#: 0.3 10*3/uL — AB (ref 0.9–3.3)
LYMPH%: 16.1 % (ref 14.0–48.0)
MCH: 32.9 pg (ref 28.0–33.4)
MCHC: 33.4 g/dL (ref 32.0–35.9)
MCV: 98 fL (ref 82–98)
MONO#: 0.1 10*3/uL (ref 0.1–0.9)
MONO%: 6.3 % (ref 0.0–13.0)
NEUT#: 1.3 10*3/uL — ABNORMAL LOW (ref 1.5–6.5)
NEUT%: 76.4 % (ref 40.0–80.0)
Platelets: 240 10*3/uL (ref 145–400)
RBC: 3.83 10*6/uL — AB (ref 4.20–5.70)
RDW: 15.5 % (ref 11.1–15.7)
WBC: 1.7 10*3/uL — AB (ref 4.0–10.0)

## 2015-01-24 LAB — CMP (CANCER CENTER ONLY)
ALBUMIN: 3.8 g/dL (ref 3.3–5.5)
ALK PHOS: 69 U/L (ref 26–84)
ALT: 32 U/L (ref 10–47)
AST: 30 U/L (ref 11–38)
BUN: 35 mg/dL — AB (ref 7–22)
CALCIUM: 9.6 mg/dL (ref 8.0–10.3)
CO2: 32 mEq/L (ref 18–33)
Chloride: 86 mEq/L — ABNORMAL LOW (ref 98–108)
Creat: 0.9 mg/dl (ref 0.6–1.2)
Glucose, Bld: 100 mg/dL (ref 73–118)
POTASSIUM: 2.7 meq/L — AB (ref 3.3–4.7)
Sodium: 134 mEq/L (ref 128–145)
TOTAL PROTEIN: 8 g/dL (ref 6.4–8.1)
Total Bilirubin: 0.9 mg/dl (ref 0.20–1.60)

## 2015-01-24 LAB — MAGNESIUM (CC13): MAGNESIUM: 2.2 mg/dL (ref 1.5–2.5)

## 2015-01-24 MED ORDER — CETUXIMAB CHEMO IV INJECTION 200 MG/100ML
250.0000 mg/m2 | Freq: Once | INTRAVENOUS | Status: AC
  Administered 2015-01-24: 400 mg via INTRAVENOUS
  Filled 2015-01-24: qty 200

## 2015-01-24 MED ORDER — DIPHENHYDRAMINE HCL 50 MG/ML IJ SOLN
50.0000 mg | Freq: Once | INTRAMUSCULAR | Status: AC
  Administered 2015-01-24: 50 mg via INTRAVENOUS

## 2015-01-24 MED ORDER — SODIUM CHLORIDE 0.9 % IV SOLN
Freq: Once | INTRAVENOUS | Status: AC
  Administered 2015-01-24: 11:00:00 via INTRAVENOUS

## 2015-01-24 MED ORDER — POTASSIUM CHLORIDE 40 MEQ/15ML (20%) PO SOLN: 15.0000 mL | Freq: Two times a day (BID) | ORAL | Status: AC

## 2015-01-24 MED ORDER — HYDROMORPHONE HCL PF 4 MG/ML IJ SOLN
4.0000 mg | Freq: Once | INTRAMUSCULAR | Status: AC
  Administered 2015-01-24: 4 mg via INTRAVENOUS
  Filled 2015-01-24: qty 1

## 2015-01-24 MED ORDER — SODIUM CHLORIDE 0.9 % IV SOLN
Freq: Once | INTRAVENOUS | Status: AC
  Administered 2015-01-24: 12:00:00 via INTRAVENOUS
  Filled 2015-01-24: qty 5

## 2015-01-24 MED ORDER — HYDROMORPHONE HCL 4 MG/ML IJ SOLN
INTRAMUSCULAR | Status: AC
  Filled 2015-01-24: qty 1

## 2015-01-24 MED ORDER — SODIUM CHLORIDE 0.9 % IV SOLN
INTRAVENOUS | Status: AC
  Administered 2015-01-24: 12:00:00 via INTRAVENOUS

## 2015-01-24 MED ORDER — POTASSIUM CHLORIDE 40 MEQ/15ML (20%) PO SOLN: 15.0000 mL | Freq: Two times a day (BID) | ORAL | Status: DC

## 2015-01-24 MED ORDER — DIPHENHYDRAMINE HCL 50 MG/ML IJ SOLN
INTRAMUSCULAR | Status: AC
  Filled 2015-01-24: qty 1

## 2015-01-24 MED ORDER — HYDROMORPHONE HCL 1 MG/ML IJ SOLN: 4.0000 mg | INTRAMUSCULAR | Status: DC | PRN

## 2015-01-24 NOTE — Patient Instructions (Signed)
Cetuximab injection What is this medicine? CETUXIMAB (se TUX i mab) is a chemotherapy drug. It targets a specific protein within cancer cells and stops the cells from growing. It is used to treat colorectal cancer and head and neck cancer. This medicine may be used for other purposes; ask your health care provider or pharmacist if you have questions. COMMON BRAND NAME(S): Erbitux What should I tell my health care provider before I take this medicine? They need to know if you have any of these conditions: -heart disease -history of irregular heartbeat -history of low levels of calcium, magnesium, or potassium in the blood -lung or breathing disease, like asthma -an unusual or allergic reaction to cetuximab, other medicines, foods, dyes, or preservatives -pregnant or trying to get pregnant -breast-feeding How should I use this medicine? This drug is given as an infusion into a vein. It is administered in a hospital or clinic by a specially trained health care professional. Talk to your pediatrician regarding the use of this medicine in children. Special care may be needed. Overdosage: If you think you have taken too much of this medicine contact a poison control center or emergency room at once. NOTE: This medicine is only for you. Do not share this medicine with others. What if I miss a dose? It is important not to miss your dose. Call your doctor or health care professional if you are unable to keep an appointment. What may interact with this medicine? Interactions are not expected. This list may not describe all possible interactions. Give your health care provider a list of all the medicines, herbs, non-prescription drugs, or dietary supplements you use. Also tell them if you smoke, drink alcohol, or use illegal drugs. Some items may interact with your medicine. What should I watch for while using this medicine? Visit your doctor or health care professional for regular checks on your  progress. This drug may make you feel generally unwell. This is not uncommon, as chemotherapy can affect healthy cells as well as cancer cells. Report any side effects. Continue your course of treatment even though you feel ill unless your doctor tells you to stop. This medicine can make you more sensitive to the sun. Keep out of the sun while taking this medicine and for 2 months after the last dose. If you cannot avoid being in the sun, wear protective clothing and use sunscreen. Do not use sun lamps or tanning beds/booths. You may need blood work done while you are taking this medicine. In some cases, you may be given additional medicines to help with side effects. Follow all directions for their use. Call your doctor or health care professional for advice if you get a fever, chills or sore throat, or other symptoms of a cold or flu. Do not treat yourself. This drug decreases your body's ability to fight infections. Try to avoid being around people who are sick. Avoid taking products that contain aspirin, acetaminophen, ibuprofen, naproxen, or ketoprofen unless instructed by your doctor. These medicines may hide a fever. Do not become pregnant while taking this medicine. Women should inform their doctor if they wish to become pregnant or think they might be pregnant. There is a potential for serious side effects to an unborn child. Use adequate birth control methods. Avoid pregnancy for at least 6 months after your last dose. Talk to your health care professional or pharmacist for more information. Do not breast-feed an infant while taking this medicine or during the 2 months after your last   dose. What side effects may I notice from receiving this medicine? Side effects that you should report to your doctor or health care professional as soon as possible: -allergic reactions like skin rash, itching or hives, swelling of the face, lips, or tongue -breathing problems -changes in vision -fast, irregular  heartbeat -feeling faint or lightheaded, falls -fever, chills -mouth sores -redness, blistering, peeling or loosening of the skin, including inside the mouth -trouble passing urine or change in the amount of urine -unusually weak or tired Side effects that usually do not require medical attention (report to your doctor or health care professional if they continue or are bothersome): -changes in skin like acne, cracks, skin dryness -constipation -diarrhea -headache -nail changes -nausea, vomiting -stomach upset -weight loss This list may not describe all possible side effects. Call your doctor for medical advice about side effects. You may report side effects to FDA at 1-800-FDA-1088. Where should I keep my medicine? This drug is given in a hospital or clinic and will not be stored at home. NOTE: This sheet is a summary. It may not cover all possible information. If you have questions about this medicine, talk to your doctor, pharmacist, or health care provider.  2015, [employer]. (2013-08-12 16:14:34)  

## 2015-01-24 NOTE — Progress Notes (Signed)
Potassium of 2.7. We will put him on potassium 40 meq twice daily down his J tube until we see him back for his next follow-up. The patient is in agreement with this.

## 2015-01-24 NOTE — Telephone Encounter (Signed)
Critical Value Potassium 2.7 Laverna Peace NP notified. She will place orders.

## 2015-01-28 ENCOUNTER — Other Ambulatory Visit: Payer: Self-pay

## 2015-01-28 DIAGNOSIS — C029 Malignant neoplasm of tongue, unspecified: Secondary | ICD-10-CM

## 2015-01-31 ENCOUNTER — Telehealth: Payer: Self-pay | Admitting: Family

## 2015-01-31 ENCOUNTER — Other Ambulatory Visit: Payer: Self-pay | Admitting: Family

## 2015-01-31 ENCOUNTER — Other Ambulatory Visit (HOSPITAL_BASED_OUTPATIENT_CLINIC_OR_DEPARTMENT_OTHER): Payer: No Typology Code available for payment source

## 2015-01-31 ENCOUNTER — Other Ambulatory Visit: Payer: Self-pay | Admitting: Hematology & Oncology

## 2015-01-31 ENCOUNTER — Encounter (HOSPITAL_BASED_OUTPATIENT_CLINIC_OR_DEPARTMENT_OTHER): Payer: Self-pay

## 2015-01-31 ENCOUNTER — Other Ambulatory Visit: Payer: Self-pay

## 2015-01-31 ENCOUNTER — Ambulatory Visit (HOSPITAL_BASED_OUTPATIENT_CLINIC_OR_DEPARTMENT_OTHER): Payer: No Typology Code available for payment source

## 2015-01-31 ENCOUNTER — Ambulatory Visit (HOSPITAL_BASED_OUTPATIENT_CLINIC_OR_DEPARTMENT_OTHER)
Admission: RE | Admit: 2015-01-31 | Discharge: 2015-01-31 | Disposition: A | Discharge status: Home / Self Care | Payer: Medicaid Other | Source: Ambulatory Visit | Attending: Family | Admitting: Family

## 2015-01-31 VITALS — BP 103/73 | HR 79 | Temp 98.2°F | Resp 18

## 2015-01-31 DIAGNOSIS — C029 Malignant neoplasm of tongue, unspecified: Secondary | ICD-10-CM

## 2015-01-31 DIAGNOSIS — R110 Nausea: Secondary | ICD-10-CM

## 2015-01-31 DIAGNOSIS — C069 Malignant neoplasm of mouth, unspecified: Secondary | ICD-10-CM

## 2015-01-31 DIAGNOSIS — Z5112 Encounter for antineoplastic immunotherapy: Secondary | ICD-10-CM

## 2015-01-31 DIAGNOSIS — C760 Malignant neoplasm of head, face and neck: Secondary | ICD-10-CM

## 2015-01-31 DIAGNOSIS — R590 Localized enlarged lymph nodes: Secondary | ICD-10-CM | POA: Insufficient documentation

## 2015-01-31 DIAGNOSIS — C770 Secondary and unspecified malignant neoplasm of lymph nodes of head, face and neck: Secondary | ICD-10-CM

## 2015-01-31 LAB — CMP (CANCER CENTER ONLY)
ALK PHOS: 68 U/L (ref 26–84)
ALT: 19 U/L (ref 10–47)
AST: 22 U/L (ref 11–38)
Albumin: 3.4 g/dL (ref 3.3–5.5)
BUN, Bld: 14 mg/dL (ref 7–22)
CALCIUM: 9.2 mg/dL (ref 8.0–10.3)
CHLORIDE: 94 meq/L — AB (ref 98–108)
CO2: 32 mEq/L (ref 18–33)
Creat: 0.5 mg/dl — ABNORMAL LOW (ref 0.6–1.2)
GLUCOSE: 90 mg/dL (ref 73–118)
POTASSIUM: 4.6 meq/L (ref 3.3–4.7)
Sodium: 134 mEq/L (ref 128–145)
TOTAL PROTEIN: 6.9 g/dL (ref 6.4–8.1)
Total Bilirubin: 0.5 mg/dl (ref 0.20–1.60)

## 2015-01-31 LAB — CBC WITH DIFFERENTIAL (CANCER CENTER ONLY)
BASO#: 0 10*3/uL (ref 0.0–0.2)
BASO%: 0.5 % (ref 0.0–2.0)
EOS ABS: 0 10*3/uL (ref 0.0–0.5)
EOS%: 0 % (ref 0.0–7.0)
HCT: 32.8 % — ABNORMAL LOW (ref 38.7–49.9)
HGB: 11.3 g/dL — ABNORMAL LOW (ref 13.0–17.1)
LYMPH#: 0.3 10*3/uL — ABNORMAL LOW (ref 0.9–3.3)
LYMPH%: 12.7 % — AB (ref 14.0–48.0)
MCH: 33.3 pg (ref 28.0–33.4)
MCHC: 34.5 g/dL (ref 32.0–35.9)
MCV: 97 fL (ref 82–98)
MONO#: 0.8 10*3/uL (ref 0.1–0.9)
MONO%: 37.1 % — AB (ref 0.0–13.0)
NEUT#: 1 10*3/uL — ABNORMAL LOW (ref 1.5–6.5)
NEUT%: 49.7 % (ref 40.0–80.0)
Platelets: 170 10*3/uL (ref 145–400)
RBC: 3.39 10*6/uL — AB (ref 4.20–5.70)
RDW: 14.8 % (ref 11.1–15.7)
WBC: 2.1 10*3/uL — AB (ref 4.0–10.0)

## 2015-01-31 LAB — MAGNESIUM (CC13): MAGNESIUM: 2 mg/dL (ref 1.5–2.5)

## 2015-01-31 MED ORDER — SODIUM CHLORIDE 0.9 % IV SOLN
Freq: Once | INTRAVENOUS | Status: AC
  Administered 2015-01-31: 11:00:00 via INTRAVENOUS
  Filled 2015-01-31: qty 5

## 2015-01-31 MED ORDER — OXYCODONE HCL 30 MG PO TABS: 30.0000 mg | ORAL_TABLET | ORAL | Status: DC | PRN

## 2015-01-31 MED ORDER — IOHEXOL 300 MG/ML  SOLN
100.0000 mL | Freq: Once | INTRAMUSCULAR | Status: AC | PRN
  Administered 2015-01-31: 100 mL via INTRAVENOUS

## 2015-01-31 MED ORDER — SODIUM CHLORIDE 0.9 % IV SOLN
Freq: Once | INTRAVENOUS | Status: AC
  Administered 2015-01-31: 10:00:00 via INTRAVENOUS

## 2015-01-31 MED ORDER — DIPHENHYDRAMINE HCL 50 MG/ML IJ SOLN
50.0000 mg | Freq: Once | INTRAMUSCULAR | Status: AC
  Administered 2015-01-31: 50 mg via INTRAVENOUS

## 2015-01-31 MED ORDER — DIPHENHYDRAMINE HCL 50 MG/ML IJ SOLN
INTRAMUSCULAR | Status: AC
Start: 2015-01-31 — End: 2015-01-31
  Filled 2015-01-31: qty 1

## 2015-01-31 MED ORDER — CETUXIMAB CHEMO IV INJECTION 200 MG/100ML
250.0000 mg/m2 | Freq: Once | INTRAVENOUS | Status: AC
  Administered 2015-01-31: 400 mg via INTRAVENOUS
  Filled 2015-01-31: qty 200

## 2015-01-31 NOTE — Telephone Encounter (Signed)
I spoke with Derek Campos and went over his CT scan results from earlier today. He has developed a small pneumothorax on the right side. He is asymptomatic with this at this time. He denies SOB. I spoke with Derek Campos regarding this and he would like Derek Campos to come in tomorrow for a chest x-ray. Derek Campos is going to do his best to come in tomorrow this.  He was happy to here that his cancer has improved significantly.

## 2015-01-31 NOTE — Patient Instructions (Signed)
Cetuximab injection What is this medicine? CETUXIMAB (se TUX i mab) is a chemotherapy drug. It targets a specific protein within cancer cells and stops the cells from growing. It is used to treat colorectal cancer and head and neck cancer. This medicine may be used for other purposes; ask your health care provider or pharmacist if you have questions. COMMON BRAND NAME(S): Erbitux What should I tell my health care provider before I take this medicine? They need to know if you have any of these conditions: -heart disease -history of irregular heartbeat -history of low levels of calcium, magnesium, or potassium in the blood -lung or breathing disease, like asthma -an unusual or allergic reaction to cetuximab, other medicines, foods, dyes, or preservatives -pregnant or trying to get pregnant -breast-feeding How should I use this medicine? This drug is given as an infusion into a vein. It is administered in a hospital or clinic by a specially trained health care professional. Talk to your pediatrician regarding the use of this medicine in children. Special care may be needed. Overdosage: If you think you have taken too much of this medicine contact a poison control center or emergency room at once. NOTE: This medicine is only for you. Do not share this medicine with others. What if I miss a dose? It is important not to miss your dose. Call your doctor or health care professional if you are unable to keep an appointment. What may interact with this medicine? Interactions are not expected. This list may not describe all possible interactions. Give your health care provider a list of all the medicines, herbs, non-prescription drugs, or dietary supplements you use. Also tell them if you smoke, drink alcohol, or use illegal drugs. Some items may interact with your medicine. What should I watch for while using this medicine? Visit your doctor or health care professional for regular checks on your  progress. This drug may make you feel generally unwell. This is not uncommon, as chemotherapy can affect healthy cells as well as cancer cells. Report any side effects. Continue your course of treatment even though you feel ill unless your doctor tells you to stop. This medicine can make you more sensitive to the sun. Keep out of the sun while taking this medicine and for 2 months after the last dose. If you cannot avoid being in the sun, wear protective clothing and use sunscreen. Do not use sun lamps or tanning beds/booths. You may need blood work done while you are taking this medicine. In some cases, you may be given additional medicines to help with side effects. Follow all directions for their use. Call your doctor or health care professional for advice if you get a fever, chills or sore throat, or other symptoms of a cold or flu. Do not treat yourself. This drug decreases your body's ability to fight infections. Try to avoid being around people who are sick. Avoid taking products that contain aspirin, acetaminophen, ibuprofen, naproxen, or ketoprofen unless instructed by your doctor. These medicines may hide a fever. Do not become pregnant while taking this medicine. Women should inform their doctor if they wish to become pregnant or think they might be pregnant. There is a potential for serious side effects to an unborn child. Use adequate birth control methods. Avoid pregnancy for at least 6 months after your last dose. Talk to your health care professional or pharmacist for more information. Do not breast-feed an infant while taking this medicine or during the 2 months after your last   dose. What side effects may I notice from receiving this medicine? Side effects that you should report to your doctor or health care professional as soon as possible: -allergic reactions like skin rash, itching or hives, swelling of the face, lips, or tongue -breathing problems -changes in vision -fast, irregular  heartbeat -feeling faint or lightheaded, falls -fever, chills -mouth sores -redness, blistering, peeling or loosening of the skin, including inside the mouth -trouble passing urine or change in the amount of urine -unusually weak or tired Side effects that usually do not require medical attention (report to your doctor or health care professional if they continue or are bothersome): -changes in skin like acne, cracks, skin dryness -constipation -diarrhea -headache -nail changes -nausea, vomiting -stomach upset -weight loss This list may not describe all possible side effects. Call your doctor for medical advice about side effects. You may report side effects to FDA at 1-800-FDA-1088. Where should I keep my medicine? This drug is given in a hospital or clinic and will not be stored at home. NOTE: This sheet is a summary. It may not cover all possible information. If you have questions about this medicine, talk to your doctor, pharmacist, or health care provider.  2015, [employer]. (2013-08-12 16:14:34)  

## 2015-01-31 NOTE — Progress Notes (Signed)
Per Dr Marin Olp ok to treat with ANC 1.0

## 2015-02-07 ENCOUNTER — Other Ambulatory Visit: Payer: Self-pay | Admitting: *Deleted

## 2015-02-07 DIAGNOSIS — C069 Malignant neoplasm of mouth, unspecified: Secondary | ICD-10-CM

## 2015-02-08 ENCOUNTER — Other Ambulatory Visit (HOSPITAL_BASED_OUTPATIENT_CLINIC_OR_DEPARTMENT_OTHER): Payer: No Typology Code available for payment source

## 2015-02-08 ENCOUNTER — Ambulatory Visit (HOSPITAL_BASED_OUTPATIENT_CLINIC_OR_DEPARTMENT_OTHER): Payer: No Typology Code available for payment source

## 2015-02-08 ENCOUNTER — Ambulatory Visit (HOSPITAL_BASED_OUTPATIENT_CLINIC_OR_DEPARTMENT_OTHER)
Admission: RE | Admit: 2015-02-08 | Discharge: 2015-02-08 | Disposition: A | Discharge status: Home / Self Care | Payer: Medicaid Other | Source: Ambulatory Visit | Attending: Family | Admitting: Family

## 2015-02-08 ENCOUNTER — Encounter: Payer: Self-pay | Admitting: Hematology & Oncology

## 2015-02-08 ENCOUNTER — Ambulatory Visit (HOSPITAL_BASED_OUTPATIENT_CLINIC_OR_DEPARTMENT_OTHER): Payer: No Typology Code available for payment source | Admitting: Hematology & Oncology

## 2015-02-08 VITALS — BP 103/60 | HR 78 | Temp 97.8°F | Resp 16 | Ht 70.0 in | Wt 121.0 lb

## 2015-02-08 DIAGNOSIS — C029 Malignant neoplasm of tongue, unspecified: Secondary | ICD-10-CM

## 2015-02-08 DIAGNOSIS — C069 Malignant neoplasm of mouth, unspecified: Secondary | ICD-10-CM

## 2015-02-08 DIAGNOSIS — J449 Chronic obstructive pulmonary disease, unspecified: Secondary | ICD-10-CM | POA: Insufficient documentation

## 2015-02-08 DIAGNOSIS — Z5111 Encounter for antineoplastic chemotherapy: Secondary | ICD-10-CM

## 2015-02-08 DIAGNOSIS — R911 Solitary pulmonary nodule: Secondary | ICD-10-CM | POA: Insufficient documentation

## 2015-02-08 DIAGNOSIS — Z5112 Encounter for antineoplastic immunotherapy: Secondary | ICD-10-CM

## 2015-02-08 DIAGNOSIS — C770 Secondary and unspecified malignant neoplasm of lymph nodes of head, face and neck: Secondary | ICD-10-CM

## 2015-02-08 DIAGNOSIS — J939 Pneumothorax, unspecified: Secondary | ICD-10-CM | POA: Diagnosis present

## 2015-02-08 LAB — CMP (CANCER CENTER ONLY)
ALK PHOS: 67 U/L (ref 26–84)
ALT: 14 U/L (ref 10–47)
AST: 22 U/L (ref 11–38)
Albumin: 3.4 g/dL (ref 3.3–5.5)
BUN: 17 mg/dL (ref 7–22)
CALCIUM: 9.1 mg/dL (ref 8.0–10.3)
CHLORIDE: 98 meq/L (ref 98–108)
CO2: 31 mEq/L (ref 18–33)
Creat: 0.8 mg/dl (ref 0.6–1.2)
GLUCOSE: 69 mg/dL — AB (ref 73–118)
POTASSIUM: 3.8 meq/L (ref 3.3–4.7)
Sodium: 137 mEq/L (ref 128–145)
Total Bilirubin: 0.4 mg/dl (ref 0.20–1.60)
Total Protein: 6.8 g/dL (ref 6.4–8.1)

## 2015-02-08 LAB — CBC WITH DIFFERENTIAL (CANCER CENTER ONLY)
BASO#: 0 10*3/uL (ref 0.0–0.2)
BASO%: 0.2 % (ref 0.0–2.0)
EOS ABS: 0 10*3/uL (ref 0.0–0.5)
EOS%: 0 % (ref 0.0–7.0)
HEMATOCRIT: 31.2 % — AB (ref 38.7–49.9)
HGB: 10.4 g/dL — ABNORMAL LOW (ref 13.0–17.1)
LYMPH#: 0.3 10*3/uL — AB (ref 0.9–3.3)
LYMPH%: 5.8 % — AB (ref 14.0–48.0)
MCH: 33.3 pg (ref 28.0–33.4)
MCHC: 33.3 g/dL (ref 32.0–35.9)
MCV: 100 fL — AB (ref 82–98)
MONO#: 0.7 10*3/uL (ref 0.1–0.9)
MONO%: 12.7 % (ref 0.0–13.0)
NEUT#: 4.4 10*3/uL (ref 1.5–6.5)
NEUT%: 81.3 % — AB (ref 40.0–80.0)
PLATELETS: 102 10*3/uL — AB (ref 145–400)
RBC: 3.12 10*6/uL — ABNORMAL LOW (ref 4.20–5.70)
RDW: 14.6 % (ref 11.1–15.7)
WBC: 5.4 10*3/uL (ref 4.0–10.0)

## 2015-02-08 LAB — MAGNESIUM (CC13): Magnesium: 1.9 mg/dl (ref 1.5–2.5)

## 2015-02-08 MED ORDER — DIPHENHYDRAMINE HCL 50 MG/ML IJ SOLN
INTRAMUSCULAR | Status: AC
  Filled 2015-02-08: qty 1

## 2015-02-08 MED ORDER — HYDROMORPHONE HCL 4 MG PO TABS: ORAL_TABLET | ORAL | Status: DC

## 2015-02-08 MED ORDER — PEGFILGRASTIM 6 MG/0.6ML ~~LOC~~ PSKT: 6.0000 mg | PREFILLED_SYRINGE | Freq: Once | SUBCUTANEOUS | Status: DC

## 2015-02-08 MED ORDER — SODIUM CHLORIDE 0.9 % IV SOLN
550.0000 mg | Freq: Once | INTRAVENOUS | Status: AC
  Administered 2015-02-08: 550 mg via INTRAVENOUS
  Filled 2015-02-08: qty 55

## 2015-02-08 MED ORDER — DIPHENHYDRAMINE HCL 50 MG/ML IJ SOLN
50.0000 mg | Freq: Once | INTRAMUSCULAR | Status: AC
  Administered 2015-02-08: 50 mg via INTRAVENOUS

## 2015-02-08 MED ORDER — PALONOSETRON HCL INJECTION 0.25 MG/5ML
INTRAVENOUS | Status: AC
  Filled 2015-02-08: qty 5

## 2015-02-08 MED ORDER — CETUXIMAB CHEMO IV INJECTION 200 MG/100ML
250.0000 mg/m2 | Freq: Once | INTRAVENOUS | Status: AC
Start: 2015-02-08 — End: 2015-02-08
  Administered 2015-02-08: 400 mg via INTRAVENOUS
  Filled 2015-02-08: qty 200

## 2015-02-08 MED ORDER — CARBOPLATIN CHEMO INTRADERMAL TEST DOSE 100MCG/0.02ML
100.0000 ug | Freq: Once | INTRADERMAL | Status: AC
  Administered 2015-02-08: 100 ug via INTRADERMAL
  Filled 2015-02-08: qty 0.02

## 2015-02-08 MED ORDER — SODIUM CHLORIDE 0.9 % IV SOLN
Freq: Once | INTRAVENOUS | Status: AC
  Administered 2015-02-08: 10:00:00 via INTRAVENOUS

## 2015-02-08 MED ORDER — PALONOSETRON HCL INJECTION 0.25 MG/5ML
0.2500 mg | Freq: Once | INTRAVENOUS | Status: AC
  Administered 2015-02-08: 0.25 mg via INTRAVENOUS

## 2015-02-08 MED ORDER — FOSAPREPITANT DIMEGLUMINE INJECTION 150 MG
Freq: Once | INTRAVENOUS | Status: AC
  Administered 2015-02-08: 11:00:00 via INTRAVENOUS
  Filled 2015-02-08: qty 5

## 2015-02-08 MED ORDER — DOCETAXEL CHEMO INJECTION 160 MG/16ML
65.0000 mg/m2 | Freq: Once | INTRAVENOUS | Status: AC
  Administered 2015-02-08: 110 mg via INTRAVENOUS
  Filled 2015-02-08: qty 11

## 2015-02-08 NOTE — Progress Notes (Signed)
Dr. Marin Olp ware of Mr. Derek Campos refusing to have neulasta onpro kit  on. M.d ok with it .

## 2015-02-08 NOTE — Patient Instructions (Addendum)
[facility] Discharge Instructions for Patients Receiving Chemotherapy  Today you received the following chemotherapy agents ERBITUX, Carboplatin, Taxotere  To help prevent nausea and vomiting after your treatment, we encourage you to take your nausea medication  1) Zofran (Ondansetron) 8 mg.  Take 1 tablet in the morning and one in the evening beginning day after chemotherapy.  Take for 3 days.  2) Decadron - Continue taking Decadron for 3 days after chemotherapy.  3) Compazine (Prochlorperazine) Take 1 tablet by mouth every 6 hours AS NEEDED for nausea.     If you develop nausea and vomiting that is not controlled by your nausea medication, call the clinic.   BELOW ARE SYMPTOMS THAT SHOULD BE REPORTED IMMEDIATELY:  *FEVER GREATER THAN 100.5 F  *CHILLS WITH OR WITHOUT FEVER  NAUSEA AND VOMITING THAT IS NOT CONTROLLED WITH YOUR NAUSEA MEDICATION  *UNUSUAL SHORTNESS OF BREATH  *UNUSUAL BRUISING OR BLEEDING  TENDERNESS IN MOUTH AND THROAT WITH OR WITHOUT PRESENCE OF ULCERS  *URINARY PROBLEMS  *BOWEL PROBLEMS  UNUSUAL RASH Items with * indicate a potential emergency and should be followed up as soon as possible.  Feel free to call the clinic you have any questions or concerns. The clinic phone number is [phone].  Please show the [facility] at check-in to the Emergency Department and triage nurse.      Carboplatin injection What is this medicine? CARBOPLATIN (KAR boe pla tin) is a chemotherapy drug. It targets fast dividing cells, like cancer cells, and causes these cells to die. This medicine is used to treat ovarian cancer and many other cancers. This medicine may be used for other purposes; ask your health care provider or pharmacist if you have questions. COMMON BRAND NAME(S): Paraplatin What should I tell my health care provider before I take this medicine? They need to know if you have any of these conditions: -blood disorders -hearing  problems -kidney disease -recent or ongoing radiation therapy -an unusual or allergic reaction to carboplatin, cisplatin, other chemotherapy, other medicines, foods, dyes, or preservatives -pregnant or trying to get pregnant -breast-feeding How should I use this medicine? This drug is usually given as an infusion into a vein. It is administered in a hospital or clinic by a specially trained health care professional. Talk to your pediatrician regarding the use of this medicine in children. Special care may be needed. Overdosage: If you think you have taken too much of this medicine contact a poison control center or emergency room at once. NOTE: This medicine is only for you. Do not share this medicine with others. What if I miss a dose? It is important not to miss a dose. Call your doctor or health care professional if you are unable to keep an appointment. What may interact with this medicine? -medicines for seizures -medicines to increase blood counts like filgrastim, pegfilgrastim, sargramostim -some antibiotics like amikacin, gentamicin, neomycin, streptomycin, tobramycin -vaccines Talk to your doctor or health care professional before taking any of these medicines: -acetaminophen -aspirin -ibuprofen -ketoprofen -naproxen This list may not describe all possible interactions. Give your health care provider a list of all the medicines, herbs, non-prescription drugs, or dietary supplements you use. Also tell them if you smoke, drink alcohol, or use illegal drugs. Some items may interact with your medicine. What should I watch for while using this medicine? Your condition will be monitored carefully while you are receiving this medicine. You will need important blood work done while you are taking this medicine.  This drug may make you feel generally unwell. This is not uncommon, as chemotherapy can affect healthy cells as well as cancer cells. Report any side effects. Continue your course  of treatment even though you feel ill unless your doctor tells you to stop. In some cases, you may be given additional medicines to help with side effects. Follow all directions for their use. Call your doctor or health care professional for advice if you get a fever, chills or sore throat, or other symptoms of a cold or flu. Do not treat yourself. This drug decreases your body's ability to fight infections. Try to avoid being around people who are sick. This medicine may increase your risk to bruise or bleed. Call your doctor or health care professional if you notice any unusual bleeding. Be careful brushing and flossing your teeth or using a toothpick because you may get an infection or bleed more easily. If you have any dental work done, tell your dentist you are receiving this medicine. Avoid taking products that contain aspirin, acetaminophen, ibuprofen, naproxen, or ketoprofen unless instructed by your doctor. These medicines may hide a fever. Do not become pregnant while taking this medicine. Women should inform their doctor if they wish to become pregnant or think they might be pregnant. There is a potential for serious side effects to an unborn child. Talk to your health care professional or pharmacist for more information. Do not breast-feed an infant while taking this medicine. What side effects may I notice from receiving this medicine? Side effects that you should report to your doctor or health care professional as soon as possible: -allergic reactions like skin rash, itching or hives, swelling of the face, lips, or tongue -signs of infection - fever or chills, cough, sore throat, pain or difficulty passing urine -signs of decreased platelets or bleeding - bruising, pinpoint red spots on the skin, black, tarry stools, nosebleeds -signs of decreased red blood cells - unusually weak or tired, fainting spells, lightheadedness -breathing problems -changes in hearing -changes in  vision -chest pain -high blood pressure -low blood counts - This drug may decrease the number of white blood cells, red blood cells and platelets. You may be at increased risk for infections and bleeding. -nausea and vomiting -pain, swelling, redness or irritation at the injection site -pain, tingling, numbness in the hands or feet -problems with balance, talking, walking -trouble passing urine or change in the amount of urine Side effects that usually do not require medical attention (report to your doctor or health care professional if they continue or are bothersome): -hair loss -loss of appetite -metallic taste in the mouth or changes in taste This list may not describe all possible side effects. Call your doctor for medical advice about side effects. You may report side effects to FDA at 1-800-FDA-1088. Where should I keep my medicine? This drug is given in a hospital or clinic and will not be stored at home. NOTE: This sheet is a summary. It may not cover all possible information. If you have questions about this medicine, talk to your doctor, pharmacist, or health care provider.  2015, Elsevier/Gold Standard. (2007-08-05 14:38:05)   Docetaxel injection What is this medicine? DOCETAXEL (doe se TAX el) is a chemotherapy drug. It targets fast dividing cells, like cancer cells, and causes these cells to die. This medicine is used to treat many types of cancers like breast cancer, certain stomach cancers, head and neck cancer, lung cancer, and prostate cancer. This medicine may be used  for other purposes; ask your health care provider or pharmacist if you have questions. COMMON BRAND NAME(S): Docefrez, Taxotere What should I tell my health care provider before I take this medicine? They need to know if you have any of these conditions: -infection (especially a virus infection such as chickenpox, cold sores, or herpes) -liver disease -low blood counts, like low white cell, platelet, or  red cell counts -an unusual or allergic reaction to docetaxel, polysorbate 80, other chemotherapy agents, other medicines, foods, dyes, or preservatives -pregnant or trying to get pregnant -breast-feeding How should I use this medicine? This drug is given as an infusion into a vein. It is administered in a hospital or clinic by a specially trained health care professional. Talk to your pediatrician regarding the use of this medicine in children. Special care may be needed. Overdosage: If you think you have taken too much of this medicine contact a poison control center or emergency room at once. NOTE: This medicine is only for you. Do not share this medicine with others. What if I miss a dose? It is important not to miss your dose. Call your doctor or health care professional if you are unable to keep an appointment. What may interact with this medicine? -cyclosporine -erythromycin -ketoconazole -medicines to increase blood counts like filgrastim, pegfilgrastim, sargramostim -vaccines Talk to your doctor or health care professional before taking any of these medicines: -acetaminophen -aspirin -ibuprofen -ketoprofen -naproxen This list may not describe all possible interactions. Give your health care provider a list of all the medicines, herbs, non-prescription drugs, or dietary supplements you use. Also tell them if you smoke, drink alcohol, or use illegal drugs. Some items may interact with your medicine. What should I watch for while using this medicine? Your condition will be monitored carefully while you are receiving this medicine. You will need important blood work done while you are taking this medicine. This drug may make you feel generally unwell. This is not uncommon, as chemotherapy can affect healthy cells as well as cancer cells. Report any side effects. Continue your course of treatment even though you feel ill unless your doctor tells you to stop. In some cases, you may be  given additional medicines to help with side effects. Follow all directions for their use. Call your doctor or health care professional for advice if you get a fever, chills or sore throat, or other symptoms of a cold or flu. Do not treat yourself. This drug decreases your body's ability to fight infections. Try to avoid being around people who are sick. This medicine may increase your risk to bruise or bleed. Call your doctor or health care professional if you notice any unusual bleeding. Be careful brushing and flossing your teeth or using a toothpick because you may get an infection or bleed more easily. If you have any dental work done, tell your dentist you are receiving this medicine. Avoid taking products that contain aspirin, acetaminophen, ibuprofen, naproxen, or ketoprofen unless instructed by your doctor. These medicines may hide a fever. This medicine contains an alcohol in the product. You may get drowsy or dizzy. Do not drive, use machinery, or do anything that needs mental alertness until you know how this medicine affects you. Do not stand or sit up quickly, especially if you are an older patient. This reduces the risk of dizzy or fainting spells. Avoid alcoholic drinks Do not become pregnant while taking this medicine. Women should inform their doctor if they wish to become pregnant or  think they might be pregnant. There is a potential for serious side effects to an unborn child. Talk to your health care professional or pharmacist for more information. Do not breast-feed an infant while taking this medicine. What side effects may I notice from receiving this medicine? Side effects that you should report to your doctor or health care professional as soon as possible: -allergic reactions like skin rash, itching or hives, swelling of the face, lips, or tongue -low blood counts - This drug may decrease the number of white blood cells, red blood cells and platelets. You may be at increased risk  for infections and bleeding. -signs of infection - fever or chills, cough, sore throat, pain or difficulty passing urine -signs of decreased platelets or bleeding - bruising, pinpoint red spots on the skin, black, tarry stools, nosebleeds -signs of decreased red blood cells - unusually weak or tired, fainting spells, lightheadedness -breathing problems -fast or irregular heartbeat -low blood pressure -mouth sores -nausea and vomiting -pain, swelling, redness or irritation at the injection site -pain, tingling, numbness in the hands or feet -swelling of the ankle, feet, hands -weight gain Side effects that usually do not require medical attention (report to your prescriber or health care professional if they continue or are bothersome): -bone pain -complete hair loss including hair on your head, underarms, pubic hair, eyebrows, and eyelashes -diarrhea -excessive tearing -changes in the color of fingernails -loosening of the fingernails -nausea -muscle pain -red flush to skin -sweating -weak or tired This list may not describe all possible side effects. Call your doctor for medical advice about side effects. You may report side effects to FDA at 1-800-FDA-1088. Where should I keep my medicine? This drug is given in a hospital or clinic and will not be stored at home. NOTE: This sheet is a summary. It may not cover all possible information. If you have questions about this medicine, talk to your doctor, pharmacist, or health care provider.  2015, Elsevier/Gold Standard. (2013-03-26 22:21:02)   Cetuximab injection What is this medicine? CETUXIMAB (se TUX i mab) is a chemotherapy drug. It targets a specific protein within cancer cells and stops the cells from growing. It is used to treat colorectal cancer and head and neck cancer. This medicine may be used for other purposes; ask your health care provider or pharmacist if you have questions. COMMON BRAND NAME(S): Erbitux What should  I tell my health care provider before I take this medicine? They need to know if you have any of these conditions: -heart disease -history of irregular heartbeat -history of low levels of calcium, magnesium, or potassium in the blood -lung or breathing disease, like asthma -an unusual or allergic reaction to cetuximab, other medicines, foods, dyes, or preservatives -pregnant or trying to get pregnant -breast-feeding How should I use this medicine? This drug is given as an infusion into a vein. It is administered in a hospital or clinic by a specially trained health care professional. Talk to your pediatrician regarding the use of this medicine in children. Special care may be needed. Overdosage: If you think you have taken too much of this medicine contact a poison control center or emergency room at once. NOTE: This medicine is only for you. Do not share this medicine with others. What if I miss a dose? It is important not to miss your dose. Call your doctor or health care professional if you are unable to keep an appointment. What may interact with this medicine? Interactions are not expected.  This list may not describe all possible interactions. Give your health care provider a list of all the medicines, herbs, non-prescription drugs, or dietary supplements you use. Also tell them if you smoke, drink alcohol, or use illegal drugs. Some items may interact with your medicine. What should I watch for while using this medicine? Visit your doctor or health care professional for regular checks on your progress. This drug may make you feel generally unwell. This is not uncommon, as chemotherapy can affect healthy cells as well as cancer cells. Report any side effects. Continue your course of treatment even though you feel ill unless your doctor tells you to stop. This medicine can make you more sensitive to the sun. Keep out of the sun while taking this medicine and for 2 months after the last dose.  If you cannot avoid being in the sun, wear protective clothing and use sunscreen. Do not use sun lamps or tanning beds/booths. You may need blood work done while you are taking this medicine. In some cases, you may be given additional medicines to help with side effects. Follow all directions for their use. Call your doctor or health care professional for advice if you get a fever, chills or sore throat, or other symptoms of a cold or flu. Do not treat yourself. This drug decreases your body's ability to fight infections. Try to avoid being around people who are sick. Avoid taking products that contain aspirin, acetaminophen, ibuprofen, naproxen, or ketoprofen unless instructed by your doctor. These medicines may hide a fever. Do not become pregnant while taking this medicine. Women should inform their doctor if they wish to become pregnant or think they might be pregnant. There is a potential for serious side effects to an unborn child. Use adequate birth control methods. Avoid pregnancy for at least 6 months after your last dose. Talk to your health care professional or pharmacist for more information. Do not breast-feed an infant while taking this medicine or during the 2 months after your last dose. What side effects may I notice from receiving this medicine? Side effects that you should report to your doctor or health care professional as soon as possible: -allergic reactions like skin rash, itching or hives, swelling of the face, lips, or tongue -breathing problems -changes in vision -fast, irregular heartbeat -feeling faint or lightheaded, falls -fever, chills -mouth sores -redness, blistering, peeling or loosening of the skin, including inside the mouth -trouble passing urine or change in the amount of urine -unusually weak or tired Side effects that usually do not require medical attention (report to your doctor or health care professional if they continue or are bothersome): -changes in  skin like acne, cracks, skin dryness -constipation -diarrhea -headache -nail changes -nausea, vomiting -stomach upset -weight loss This list may not describe all possible side effects. Call your doctor for medical advice about side effects. You may report side effects to FDA at 1-800-FDA-1088. Where should I keep my medicine? This drug is given in a hospital or clinic and will not be stored at home. NOTE: This sheet is a summary. It may not cover all possible information. If you have questions about this medicine, talk to your doctor, pharmacist, or health care provider.  2015, Elsevier/Gold Standard. (2013-08-12 16:14:34)

## 2015-02-08 NOTE — Progress Notes (Signed)
Hematology and Oncology Follow Up Visit  Derek Campos [id number] 04-24-1971 44 y.o. 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 Derek Campos is back for follow-up. He actually looks quite good. He still have problems with pain in his mouth. I will adjust his Dilaudid dosing.  We did go ahead and repeat his scans. Thank you, his scans show that he was responding quite nicely. He had very nice resolution of his mediastinal disease.  He is not complaining of any back pain.  He's had no diarrhea or constipation. If he does not want any Neulasta. He says it just makes him sick.  He seems to be eating a little bit better. He has a feeding tube in. He saw as a very hard time swallowing. Thankfully  , his weight is holding steady.  Overall, his performance status is ECOG 2.   Medications:  Current outpatient prescriptions:  .  Alum & Mag Hydroxide-Simeth (MAGIC MOUTHWASH W/LIDOCAINE) SOLN, Take 15 mLs by mouth 4 (four) times daily as needed for mouth pain., Disp: 500 mL, Rfl: 2 .  fentaNYL (DURAGESIC - DOSED MCG/HR) 75 MCG/HR, Place 2 patches (150 mcg total) onto the skin every 3 (three) days., Disp: 20 patch, Rfl: 0 .  gabapentin (NEURONTIN) 600 MG tablet, Take 1 tablet (600 mg total) by mouth 3 (three) times daily., Disp: 90 tablet, Rfl: 4 .  HYDROmorphone (DILAUDID) 4 MG tablet, Take 1-2 pills, if needed, every 4 hr for pain, Disp: 180 tablet, Rfl: 0 .  lidocaine (XYLOCAINE) 2 % solution, Use as directed 20 mLs in the mouth or throat every 3 (three) hours as needed for mouth pain., Disp: 1000 mL, Rfl: 1 .  oxycodone (ROXICODONE) 30 MG immediate release tablet, Take 1 tablet (30 mg total) by mouth every 4 (four) hours as needed for pain., Disp: 120  tablet, Rfl: 0 .  Potassium Chloride 40 MEQ/15ML (20%) SOLN, Take 15 mLs by mouth 2 (two) times daily., Disp: 473 mL, Rfl: 1 .  promethazine (PHENERGAN) 25 MG tablet, Take 1 tablet (25 mg total) by mouth every 6 (six) hours as needed for nausea or vomiting., Disp: 90 tablet, Rfl: 0 .  ranitidine (ZANTAC) 150 MG tablet, Take 1 tablet (150 mg total) by mouth 2 (two) times daily. Crush and put down feeding tube., Disp: 60 tablet, Rfl: 3 No current facility-administered medications for this visit.  Facility-Administered Medications Ordered in Other Visits:  .  CARBOplatin (PARAPLATIN) 550 mg in sodium chloride 0.9 % 250 mL chemo infusion, 550 mg, Intravenous, Once, Volanda Napoleon, MD .  DOCEtaxel (TAXOTERE) 110 mg in dextrose 5 % 250 mL chemo infusion, 65 mg/m2 (Treatment Plan Actual), Intravenous, Once, Volanda Napoleon, MD  Allergies:  Allergies  Allergen Reactions  . Other     Cat Gut Stitches - unknown reaction.Told by parents.    Past Medical History, Surgical history, Social history, and Family History were reviewed and updated.  Review of Systems: As above  Physical Exam:  height is 5\' 10"  (1.778 m) and weight is 121 lb (54.885 kg). His oral temperature is 97.8 F (36.6 C). His blood pressure is 103/60 and his pulse is 78. His respiration is 16.   Wt Readings from Last 3 Encounters:  02/08/15 121  lb (54.885 kg)  01/24/15 107 lb (48.535 kg)  01/18/15 120 lb (54.432 kg)     Thin white gentleman. He is in no obvious distress. Head and neck exam shows his tongue to be significantly less inflamed and disfigured. There is not as much obvious tumor that is noted on the tongue. . 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 Surprisingly there is no adenopathy in his neck. Pupils react appropriately. Lungs are clear. Cardiac exam regular rate and rhythm with no murmurs, rubs or bruits. Abdomen is soft. Has a feeding tube as intact. There is no fluid wave. There is no palpable liver or  spleen tip. Extremities shows no clubbing, cyanosis or edema. He has good strength in his extremities. He has good range of motion of his joints. Skin exam shows no rashes, ecchymoses or petechia. Neurological exam is nonfocal.  Lab Results  Component Value Date   WBC 5.4 02/08/2015   HGB 10.4* 02/08/2015   HCT 31.2* 02/08/2015   MCV 100* 02/08/2015   PLT 102* 02/08/2015     Chemistry      Component Value Date/Time   NA 137 02/08/2015 0836   NA 139 12/03/2014 0629   NA 137 10/25/2014 1018   K 3.8 02/08/2015 0836   K 3.7 12/03/2014 0629   K 4.1 10/25/2014 1018   CL 98 02/08/2015 0836   CL 103 12/03/2014 0629   CO2 31 02/08/2015 0836   CO2 26 12/03/2014 0629   CO2 26 10/25/2014 1018   BUN 17 02/08/2015 0836   BUN 18 12/03/2014 0629   BUN 15.3 10/25/2014 1018   CREATININE 0.8 02/08/2015 0836   CREATININE 0.83 12/03/2014 0629   CREATININE 0.7 10/25/2014 1018      Component Value Date/Time   CALCIUM 9.1 02/08/2015 0836   CALCIUM 8.6* 12/03/2014 0629   CALCIUM 9.3 10/25/2014 1018   ALKPHOS 67 02/08/2015 0836   ALKPHOS 110 12/02/2014 1504   ALKPHOS 74 10/25/2014 1018   AST 22 02/08/2015 0836   AST 29 12/02/2014 1504   AST 16 10/25/2014 1018   ALT 14 02/08/2015 0836   ALT 18 12/02/2014 1504   ALT 12 10/25/2014 1018   BILITOT 0.40 02/08/2015 0836   BILITOT 0.9 12/02/2014 1504   BILITOT 0.20 10/25/2014 1018         Impression and Plan: Mr. Derek Campos is a 44 year old white gentleman with metastatic head and neck cancer.  His tongue looks a whole lot better.   I'm glad that his skin looks better.  At some point, I think we will have to give him a "vacation" from treatment.  I want to try to get him through October. I think that once we him into November, we might be able to get him through the holidays without having to treat him. I think this would make life better for him. We are not going to cure this so I want to make sure that we focus on his quality of life.     We will go ahead and plan to give him back in 3 weeks. He comes back weekly for his Erbitux.           Volanda Napoleon, MD 9/27/20161:29 PM

## 2015-02-14 ENCOUNTER — Other Ambulatory Visit: Payer: Self-pay | Admitting: *Deleted

## 2015-02-14 DIAGNOSIS — C069 Malignant neoplasm of mouth, unspecified: Secondary | ICD-10-CM

## 2015-02-15 ENCOUNTER — Other Ambulatory Visit: Payer: Self-pay | Admitting: Hematology & Oncology

## 2015-02-15 ENCOUNTER — Telehealth: Payer: Self-pay | Admitting: *Deleted

## 2015-02-15 ENCOUNTER — Ambulatory Visit (HOSPITAL_BASED_OUTPATIENT_CLINIC_OR_DEPARTMENT_OTHER): Payer: Medicaid Other

## 2015-02-15 ENCOUNTER — Other Ambulatory Visit (HOSPITAL_BASED_OUTPATIENT_CLINIC_OR_DEPARTMENT_OTHER): Payer: Medicaid Other

## 2015-02-15 VITALS — BP 104/63 | HR 75 | Temp 98.2°F | Resp 18 | Wt 112.0 lb

## 2015-02-15 DIAGNOSIS — D509 Iron deficiency anemia, unspecified: Secondary | ICD-10-CM

## 2015-02-15 DIAGNOSIS — M899 Disorder of bone, unspecified: Secondary | ICD-10-CM

## 2015-02-15 DIAGNOSIS — Z5112 Encounter for antineoplastic immunotherapy: Secondary | ICD-10-CM

## 2015-02-15 DIAGNOSIS — C029 Malignant neoplasm of tongue, unspecified: Secondary | ICD-10-CM

## 2015-02-15 DIAGNOSIS — C760 Malignant neoplasm of head, face and neck: Secondary | ICD-10-CM

## 2015-02-15 DIAGNOSIS — K909 Intestinal malabsorption, unspecified: Secondary | ICD-10-CM

## 2015-02-15 DIAGNOSIS — C069 Malignant neoplasm of mouth, unspecified: Secondary | ICD-10-CM

## 2015-02-15 LAB — CBC WITH DIFFERENTIAL (CANCER CENTER ONLY)
BASO#: 0 10*3/uL (ref 0.0–0.2)
BASO%: 0 % (ref 0.0–2.0)
EOS%: 0 % (ref 0.0–7.0)
Eosinophils Absolute: 0 10*3/uL (ref 0.0–0.5)
HCT: 31.1 % — ABNORMAL LOW (ref 38.7–49.9)
HGB: 10.7 g/dL — ABNORMAL LOW (ref 13.0–17.1)
LYMPH#: 0.2 10*3/uL — ABNORMAL LOW (ref 0.9–3.3)
LYMPH%: 30.3 % (ref 14.0–48.0)
MCH: 33.3 pg (ref 28.0–33.4)
MCHC: 34.4 g/dL (ref 32.0–35.9)
MCV: 97 fL (ref 82–98)
MONO#: 0.1 10*3/uL (ref 0.1–0.9)
MONO%: 10.5 % (ref 0.0–13.0)
NEUT#: 0.5 10*3/uL — CL (ref 1.5–6.5)
NEUT%: 59.2 % (ref 40.0–80.0)
PLATELETS: 62 10*3/uL — AB (ref 145–400)
RBC: 3.21 10*6/uL — ABNORMAL LOW (ref 4.20–5.70)
RDW: 13.1 % (ref 11.1–15.7)
WBC: 0.8 10*3/uL — CL (ref 4.0–10.0)

## 2015-02-15 LAB — CMP (CANCER CENTER ONLY)
ALK PHOS: 80 U/L (ref 26–84)
ALT: 17 U/L (ref 10–47)
AST: 21 U/L (ref 11–38)
Albumin: 3.3 g/dL (ref 3.3–5.5)
BILIRUBIN TOTAL: 0.6 mg/dL (ref 0.20–1.60)
BUN: 19 mg/dL (ref 7–22)
CALCIUM: 9.6 mg/dL (ref 8.0–10.3)
CO2: 32 meq/L (ref 18–33)
Chloride: 92 mEq/L — ABNORMAL LOW (ref 98–108)
Creat: 0.8 mg/dl (ref 0.6–1.2)
GLUCOSE: 132 mg/dL — AB (ref 73–118)
Potassium: 3.5 mEq/L (ref 3.3–4.7)
Sodium: 136 mEq/L (ref 128–145)
Total Protein: 7.2 g/dL (ref 6.4–8.1)

## 2015-02-15 LAB — MAGNESIUM (CC13): Magnesium: 1.7 mg/dl (ref 1.5–2.5)

## 2015-02-15 MED ORDER — DIPHENHYDRAMINE HCL 50 MG/ML IJ SOLN
50.0000 mg | Freq: Once | INTRAMUSCULAR | Status: AC
  Administered 2015-02-15: 50 mg via INTRAVENOUS

## 2015-02-15 MED ORDER — PROMETHAZINE HCL 25 MG/ML IJ SOLN
12.5000 mg | Freq: Four times a day (QID) | INTRAMUSCULAR | Status: DC | PRN
  Administered 2015-02-15: 12.5 mg via INTRAVENOUS

## 2015-02-15 MED ORDER — DENOSUMAB 120 MG/1.7ML ~~LOC~~ SOLN
120.0000 mg | Freq: Once | SUBCUTANEOUS | Status: AC
  Administered 2015-02-15: 120 mg via SUBCUTANEOUS
  Filled 2015-02-15: qty 1.7

## 2015-02-15 MED ORDER — HYDROMORPHONE HCL 1 MG/ML IJ SOLN
INTRAMUSCULAR | Status: AC
  Filled 2015-02-15: qty 2

## 2015-02-15 MED ORDER — SODIUM CHLORIDE 0.9 % IV SOLN
Freq: Once | INTRAVENOUS | Status: AC
  Administered 2015-02-15: 12:00:00 via INTRAVENOUS

## 2015-02-15 MED ORDER — SODIUM CHLORIDE 0.9 % IV SOLN
Freq: Once | INTRAVENOUS | Status: AC
  Administered 2015-02-15: 12:00:00 via INTRAVENOUS
  Filled 2015-02-15: qty 5

## 2015-02-15 MED ORDER — HYDROMORPHONE HCL 1 MG/ML IJ SOLN
2.0000 mg | INTRAMUSCULAR | Status: DC | PRN
  Administered 2015-02-15: 2 mg via INTRAVENOUS

## 2015-02-15 MED ORDER — CETUXIMAB CHEMO IV INJECTION 200 MG/100ML
250.0000 mg/m2 | Freq: Once | INTRAVENOUS | Status: AC
  Administered 2015-02-15: 400 mg via INTRAVENOUS
  Filled 2015-02-15: qty 200

## 2015-02-15 MED ORDER — DIPHENHYDRAMINE HCL 50 MG/ML IJ SOLN
INTRAMUSCULAR | Status: AC
  Filled 2015-02-15: qty 1

## 2015-02-15 MED ORDER — PROMETHAZINE HCL 25 MG/ML IJ SOLN
INTRAMUSCULAR | Status: AC
  Filled 2015-02-15: qty 1

## 2015-02-15 NOTE — Telephone Encounter (Signed)
Critical Values WBC 0.8 Absolute Neutrophils 0.5 Dr Marin Olp notified and no orders received.

## 2015-02-15 NOTE — Patient Instructions (Signed)
Cetuximab injection What is this medicine? CETUXIMAB (se TUX i mab) is a chemotherapy drug. It targets a specific protein within cancer cells and stops the cells from growing. It is used to treat colorectal cancer and head and neck cancer. This medicine may be used for other purposes; ask your health care provider or pharmacist if you have questions. COMMON BRAND NAME(S): Erbitux What should I tell my health care provider before I take this medicine? They need to know if you have any of these conditions: -heart disease -history of irregular heartbeat -history of low levels of calcium, magnesium, or potassium in the blood -lung or breathing disease, like asthma -an unusual or allergic reaction to cetuximab, other medicines, foods, dyes, or preservatives -pregnant or trying to get pregnant -breast-feeding How should I use this medicine? This drug is given as an infusion into a vein. It is administered in a hospital or clinic by a specially trained health care professional. Talk to your pediatrician regarding the use of this medicine in children. Special care may be needed. Overdosage: If you think you have taken too much of this medicine contact a poison control center or emergency room at once. NOTE: This medicine is only for you. Do not share this medicine with others. What if I miss a dose? It is important not to miss your dose. Call your doctor or health care professional if you are unable to keep an appointment. What may interact with this medicine? Interactions are not expected. This list may not describe all possible interactions. Give your health care provider a list of all the medicines, herbs, non-prescription drugs, or dietary supplements you use. Also tell them if you smoke, drink alcohol, or use illegal drugs. Some items may interact with your medicine. What should I watch for while using this medicine? Visit your doctor or health care professional for regular checks on your  progress. This drug may make you feel generally unwell. This is not uncommon, as chemotherapy can affect healthy cells as well as cancer cells. Report any side effects. Continue your course of treatment even though you feel ill unless your doctor tells you to stop. This medicine can make you more sensitive to the sun. Keep out of the sun while taking this medicine and for 2 months after the last dose. If you cannot avoid being in the sun, wear protective clothing and use sunscreen. Do not use sun lamps or tanning beds/booths. You may need blood work done while you are taking this medicine. In some cases, you may be given additional medicines to help with side effects. Follow all directions for their use. Call your doctor or health care professional for advice if you get a fever, chills or sore throat, or other symptoms of a cold or flu. Do not treat yourself. This drug decreases your body's ability to fight infections. Try to avoid being around people who are sick. Avoid taking products that contain aspirin, acetaminophen, ibuprofen, naproxen, or ketoprofen unless instructed by your doctor. These medicines may hide a fever. Do not become pregnant while taking this medicine. Women should inform their doctor if they wish to become pregnant or think they might be pregnant. There is a potential for serious side effects to an unborn child. Use adequate birth control methods. Avoid pregnancy for at least 6 months after your last dose. Talk to your health care professional or pharmacist for more information. Do not breast-feed an infant while taking this medicine or during the 2 months after your last   dose. What side effects may I notice from receiving this medicine? Side effects that you should report to your doctor or health care professional as soon as possible: -allergic reactions like skin rash, itching or hives, swelling of the face, lips, or tongue -breathing problems -changes in vision -fast, irregular  heartbeat -feeling faint or lightheaded, falls -fever, chills -mouth sores -redness, blistering, peeling or loosening of the skin, including inside the mouth -trouble passing urine or change in the amount of urine -unusually weak or tired Side effects that usually do not require medical attention (report to your doctor or health care professional if they continue or are bothersome): -changes in skin like acne, cracks, skin dryness -constipation -diarrhea -headache -nail changes -nausea, vomiting -stomach upset -weight loss This list may not describe all possible side effects. Call your doctor for medical advice about side effects. You may report side effects to FDA at 1-800-FDA-1088. Where should I keep my medicine? This drug is given in a hospital or clinic and will not be stored at home. NOTE: This sheet is a summary. It may not cover all possible information. If you have questions about this medicine, talk to your doctor, pharmacist, or health care provider.  2015, [employer]. (2013-08-12 16:14:34)  

## 2015-02-21 ENCOUNTER — Other Ambulatory Visit: Payer: Self-pay | Admitting: *Deleted

## 2015-02-21 DIAGNOSIS — C069 Malignant neoplasm of mouth, unspecified: Secondary | ICD-10-CM

## 2015-02-22 ENCOUNTER — Ambulatory Visit (HOSPITAL_BASED_OUTPATIENT_CLINIC_OR_DEPARTMENT_OTHER): Payer: Medicaid Other

## 2015-02-22 ENCOUNTER — Other Ambulatory Visit: Payer: Self-pay | Admitting: Family

## 2015-02-22 ENCOUNTER — Other Ambulatory Visit (HOSPITAL_BASED_OUTPATIENT_CLINIC_OR_DEPARTMENT_OTHER): Payer: Medicaid Other

## 2015-02-22 VITALS — BP 102/67 | HR 76 | Temp 97.9°F | Resp 18

## 2015-02-22 DIAGNOSIS — R112 Nausea with vomiting, unspecified: Secondary | ICD-10-CM

## 2015-02-22 DIAGNOSIS — C069 Malignant neoplasm of mouth, unspecified: Secondary | ICD-10-CM

## 2015-02-22 DIAGNOSIS — Z5112 Encounter for antineoplastic immunotherapy: Secondary | ICD-10-CM | POA: Diagnosis not present

## 2015-02-22 DIAGNOSIS — C029 Malignant neoplasm of tongue, unspecified: Secondary | ICD-10-CM

## 2015-02-22 DIAGNOSIS — C770 Secondary and unspecified malignant neoplasm of lymph nodes of head, face and neck: Secondary | ICD-10-CM

## 2015-02-22 LAB — CMP (CANCER CENTER ONLY)
ALBUMIN: 3.2 g/dL — AB (ref 3.3–5.5)
ALK PHOS: 75 U/L (ref 26–84)
ALT(SGPT): 21 U/L (ref 10–47)
AST: 21 U/L (ref 11–38)
BILIRUBIN TOTAL: 0.5 mg/dL (ref 0.20–1.60)
BUN, Bld: 16 mg/dL (ref 7–22)
CHLORIDE: 93 meq/L — AB (ref 98–108)
CO2: 32 mEq/L (ref 18–33)
CREATININE: 0.9 mg/dL (ref 0.6–1.2)
Calcium: 9.1 mg/dL (ref 8.0–10.3)
Glucose, Bld: 112 mg/dL (ref 73–118)
Potassium: 4.1 mEq/L (ref 3.3–4.7)
SODIUM: 135 meq/L (ref 128–145)
TOTAL PROTEIN: 7.1 g/dL (ref 6.4–8.1)

## 2015-02-22 LAB — CBC WITH DIFFERENTIAL (CANCER CENTER ONLY)
BASO#: 0 10*3/uL (ref 0.0–0.2)
BASO%: 0.3 % (ref 0.0–2.0)
EOS ABS: 0 10*3/uL (ref 0.0–0.5)
EOS%: 0 % (ref 0.0–7.0)
HCT: 31.4 % — ABNORMAL LOW (ref 38.7–49.9)
HEMOGLOBIN: 10.7 g/dL — AB (ref 13.0–17.1)
LYMPH#: 0.3 10*3/uL — ABNORMAL LOW (ref 0.9–3.3)
LYMPH%: 9.1 % — ABNORMAL LOW (ref 14.0–48.0)
MCH: 32.9 pg (ref 28.0–33.4)
MCHC: 34.1 g/dL (ref 32.0–35.9)
MCV: 97 fL (ref 82–98)
MONO#: 0.5 10*3/uL (ref 0.1–0.9)
MONO%: 16.3 % — AB (ref 0.0–13.0)
NEUT%: 74.3 % (ref 40.0–80.0)
NEUTROS ABS: 2.3 10*3/uL (ref 1.5–6.5)
Platelets: 207 10*3/uL (ref 145–400)
RBC: 3.25 10*6/uL — AB (ref 4.20–5.70)
RDW: 13.1 % (ref 11.1–15.7)
WBC: 3.1 10*3/uL — AB (ref 4.0–10.0)

## 2015-02-22 LAB — MAGNESIUM (CC13): MAGNESIUM: 1.6 mg/dL (ref 1.5–2.5)

## 2015-02-22 MED ORDER — HYDROMORPHONE HCL 4 MG/ML IJ SOLN
4.0000 mg | INTRAMUSCULAR | Status: DC | PRN
  Administered 2015-02-22: 4 mg via INTRAVENOUS

## 2015-02-22 MED ORDER — DIPHENHYDRAMINE HCL 50 MG/ML IJ SOLN
INTRAMUSCULAR | Status: AC
Start: 2015-02-22 — End: 2015-02-22
  Filled 2015-02-22: qty 1

## 2015-02-22 MED ORDER — DIPHENHYDRAMINE HCL 50 MG/ML IJ SOLN
50.0000 mg | Freq: Once | INTRAMUSCULAR | Status: AC
  Administered 2015-02-22: 50 mg via INTRAVENOUS

## 2015-02-22 MED ORDER — SODIUM CHLORIDE 0.9 % IV SOLN
Freq: Once | INTRAVENOUS | Status: AC
  Administered 2015-02-22: 10:00:00 via INTRAVENOUS

## 2015-02-22 MED ORDER — CETUXIMAB CHEMO IV INJECTION 200 MG/100ML
250.0000 mg/m2 | Freq: Once | INTRAVENOUS | Status: AC
  Administered 2015-02-22: 400 mg via INTRAVENOUS
  Filled 2015-02-22: qty 200

## 2015-02-22 MED ORDER — SODIUM CHLORIDE 0.9 % IV SOLN
Freq: Once | INTRAVENOUS | Status: AC
  Administered 2015-02-22: 11:00:00 via INTRAVENOUS
  Filled 2015-02-22: qty 5

## 2015-02-22 MED ORDER — PALONOSETRON HCL INJECTION 0.25 MG/5ML
INTRAVENOUS | Status: AC
  Filled 2015-02-22: qty 5

## 2015-02-22 MED ORDER — PALONOSETRON HCL INJECTION 0.25 MG/5ML
0.2500 mg | Freq: Once | INTRAVENOUS | Status: AC
  Administered 2015-02-22: 0.25 mg via INTRAVENOUS

## 2015-02-22 MED ORDER — HYDROMORPHONE HCL 4 MG/ML IJ SOLN
INTRAMUSCULAR | Status: AC
  Filled 2015-02-22: qty 1

## 2015-02-22 NOTE — Patient Instructions (Signed)
[facility] Discharge Instructions for Patients Receiving Chemotherapy  Today you received the following chemotherapy agents ERBITUX, Carboplatin, Taxotere  To help prevent nausea and vomiting after your treatment, we encourage you to take your nausea medication  1) Zofran (Ondansetron) 8 mg.  Take 1 tablet in the morning and one in the evening beginning day after chemotherapy.  Take for 3 days.  2) Decadron - Continue taking Decadron for 3 days after chemotherapy.  3) Compazine (Prochlorperazine) Take 1 tablet by mouth every 6 hours AS NEEDED for nausea.     If you develop nausea and vomiting that is not controlled by your nausea medication, call the clinic.   BELOW ARE SYMPTOMS THAT SHOULD BE REPORTED IMMEDIATELY:  *FEVER GREATER THAN 100.5 F  *CHILLS WITH OR WITHOUT FEVER  NAUSEA AND VOMITING THAT IS NOT CONTROLLED WITH YOUR NAUSEA MEDICATION  *UNUSUAL SHORTNESS OF BREATH  *UNUSUAL BRUISING OR BLEEDING  TENDERNESS IN MOUTH AND THROAT WITH OR WITHOUT PRESENCE OF ULCERS  *URINARY PROBLEMS  *BOWEL PROBLEMS  UNUSUAL RASH Items with * indicate a potential emergency and should be followed up as soon as possible.  Feel free to call the clinic you have any questions or concerns. The clinic phone number is [phone].  Please show the [facility] at check-in to the Emergency Department and triage nurse.      Carboplatin injection What is this medicine? CARBOPLATIN (KAR boe pla tin) is a chemotherapy drug. It targets fast dividing cells, like cancer cells, and causes these cells to die. This medicine is used to treat ovarian cancer and many other cancers. This medicine may be used for other purposes; ask your health care provider or pharmacist if you have questions. COMMON BRAND NAME(S): Paraplatin What should I tell my health care provider before I take this medicine? They need to know if you have any of these conditions: -blood disorders -hearing  problems -kidney disease -recent or ongoing radiation therapy -an unusual or allergic reaction to carboplatin, cisplatin, other chemotherapy, other medicines, foods, dyes, or preservatives -pregnant or trying to get pregnant -breast-feeding How should I use this medicine? This drug is usually given as an infusion into a vein. It is administered in a hospital or clinic by a specially trained health care professional. Talk to your pediatrician regarding the use of this medicine in children. Special care may be needed. Overdosage: If you think you have taken too much of this medicine contact a poison control center or emergency room at once. NOTE: This medicine is only for you. Do not share this medicine with others. What if I miss a dose? It is important not to miss a dose. Call your doctor or health care professional if you are unable to keep an appointment. What may interact with this medicine? -medicines for seizures -medicines to increase blood counts like filgrastim, pegfilgrastim, sargramostim -some antibiotics like amikacin, gentamicin, neomycin, streptomycin, tobramycin -vaccines Talk to your doctor or health care professional before taking any of these medicines: -acetaminophen -aspirin -ibuprofen -ketoprofen -naproxen This list may not describe all possible interactions. Give your health care provider a list of all the medicines, herbs, non-prescription drugs, or dietary supplements you use. Also tell them if you smoke, drink alcohol, or use illegal drugs. Some items may interact with your medicine. What should I watch for while using this medicine? Your condition will be monitored carefully while you are receiving this medicine. You will need important blood work done while you are taking this medicine.  This drug may make you feel generally unwell. This is not uncommon, as chemotherapy can affect healthy cells as well as cancer cells. Report any side effects. Continue your course  of treatment even though you feel ill unless your doctor tells you to stop. In some cases, you may be given additional medicines to help with side effects. Follow all directions for their use. Call your doctor or health care professional for advice if you get a fever, chills or sore throat, or other symptoms of a cold or flu. Do not treat yourself. This drug decreases your body's ability to fight infections. Try to avoid being around people who are sick. This medicine may increase your risk to bruise or bleed. Call your doctor or health care professional if you notice any unusual bleeding. Be careful brushing and flossing your teeth or using a toothpick because you may get an infection or bleed more easily. If you have any dental work done, tell your dentist you are receiving this medicine. Avoid taking products that contain aspirin, acetaminophen, ibuprofen, naproxen, or ketoprofen unless instructed by your doctor. These medicines may hide a fever. Do not become pregnant while taking this medicine. Women should inform their doctor if they wish to become pregnant or think they might be pregnant. There is a potential for serious side effects to an unborn child. Talk to your health care professional or pharmacist for more information. Do not breast-feed an infant while taking this medicine. What side effects may I notice from receiving this medicine? Side effects that you should report to your doctor or health care professional as soon as possible: -allergic reactions like skin rash, itching or hives, swelling of the face, lips, or tongue -signs of infection - fever or chills, cough, sore throat, pain or difficulty passing urine -signs of decreased platelets or bleeding - bruising, pinpoint red spots on the skin, black, tarry stools, nosebleeds -signs of decreased red blood cells - unusually weak or tired, fainting spells, lightheadedness -breathing problems -changes in hearing -changes in  vision -chest pain -high blood pressure -low blood counts - This drug may decrease the number of white blood cells, red blood cells and platelets. You may be at increased risk for infections and bleeding. -nausea and vomiting -pain, swelling, redness or irritation at the injection site -pain, tingling, numbness in the hands or feet -problems with balance, talking, walking -trouble passing urine or change in the amount of urine Side effects that usually do not require medical attention (report to your doctor or health care professional if they continue or are bothersome): -hair loss -loss of appetite -metallic taste in the mouth or changes in taste This list may not describe all possible side effects. Call your doctor for medical advice about side effects. You may report side effects to FDA at 1-800-FDA-1088. Where should I keep my medicine? This drug is given in a hospital or clinic and will not be stored at home. NOTE: This sheet is a summary. It may not cover all possible information. If you have questions about this medicine, talk to your doctor, pharmacist, or health care provider.  2015, [employer] Standard. (2007-08-05 14:38:05)   Docetaxel injection What is this medicine? DOCETAXEL (doe se TAX el) is a chemotherapy drug. It targets fast dividing cells, like cancer cells, and causes these cells to die. This medicine is used to treat many types of cancers like breast cancer, certain stomach cancers, head and neck cancer, lung cancer, and prostate cancer. This medicine may be used  for other purposes; ask your health care provider or pharmacist if you have questions. COMMON BRAND NAME(S): Docefrez, Taxotere What should I tell my health care provider before I take this medicine? They need to know if you have any of these conditions: -infection (especially a virus infection such as chickenpox, cold sores, or herpes) -liver disease -low blood counts, like low white cell, platelet, or  red cell counts -an unusual or allergic reaction to docetaxel, polysorbate 80, other chemotherapy agents, other medicines, foods, dyes, or preservatives -pregnant or trying to get pregnant -breast-feeding How should I use this medicine? This drug is given as an infusion into a vein. It is administered in a hospital or clinic by a specially trained health care professional. Talk to your pediatrician regarding the use of this medicine in children. Special care may be needed. Overdosage: If you think you have taken too much of this medicine contact a poison control center or emergency room at once. NOTE: This medicine is only for you. Do not share this medicine with others. What if I miss a dose? It is important not to miss your dose. Call your doctor or health care professional if you are unable to keep an appointment. What may interact with this medicine? -cyclosporine -erythromycin -ketoconazole -medicines to increase blood counts like filgrastim, pegfilgrastim, sargramostim -vaccines Talk to your doctor or health care professional before taking any of these medicines: -acetaminophen -aspirin -ibuprofen -ketoprofen -naproxen This list may not describe all possible interactions. Give your health care provider a list of all the medicines, herbs, non-prescription drugs, or dietary supplements you use. Also tell them if you smoke, drink alcohol, or use illegal drugs. Some items may interact with your medicine. What should I watch for while using this medicine? Your condition will be monitored carefully while you are receiving this medicine. You will need important blood work done while you are taking this medicine. This drug may make you feel generally unwell. This is not uncommon, as chemotherapy can affect healthy cells as well as cancer cells. Report any side effects. Continue your course of treatment even though you feel ill unless your doctor tells you to stop. In some cases, you may be  given additional medicines to help with side effects. Follow all directions for their use. Call your doctor or health care professional for advice if you get a fever, chills or sore throat, or other symptoms of a cold or flu. Do not treat yourself. This drug decreases your body's ability to fight infections. Try to avoid being around people who are sick. This medicine may increase your risk to bruise or bleed. Call your doctor or health care professional if you notice any unusual bleeding. Be careful brushing and flossing your teeth or using a toothpick because you may get an infection or bleed more easily. If you have any dental work done, tell your dentist you are receiving this medicine. Avoid taking products that contain aspirin, acetaminophen, ibuprofen, naproxen, or ketoprofen unless instructed by your doctor. These medicines may hide a fever. This medicine contains an alcohol in the product. You may get drowsy or dizzy. Do not drive, use machinery, or do anything that needs mental alertness until you know how this medicine affects you. Do not stand or sit up quickly, especially if you are an older patient. This reduces the risk of dizzy or fainting spells. Avoid alcoholic drinks Do not become pregnant while taking this medicine. Women should inform their doctor if they wish to become pregnant or  think they might be pregnant. There is a potential for serious side effects to an unborn child. Talk to your health care professional or pharmacist for more information. Do not breast-feed an infant while taking this medicine. What side effects may I notice from receiving this medicine? Side effects that you should report to your doctor or health care professional as soon as possible: -allergic reactions like skin rash, itching or hives, swelling of the face, lips, or tongue -low blood counts - This drug may decrease the number of white blood cells, red blood cells and platelets. You may be at increased risk  for infections and bleeding. -signs of infection - fever or chills, cough, sore throat, pain or difficulty passing urine -signs of decreased platelets or bleeding - bruising, pinpoint red spots on the skin, black, tarry stools, nosebleeds -signs of decreased red blood cells - unusually weak or tired, fainting spells, lightheadedness -breathing problems -fast or irregular heartbeat -low blood pressure -mouth sores -nausea and vomiting -pain, swelling, redness or irritation at the injection site -pain, tingling, numbness in the hands or feet -swelling of the ankle, feet, hands -weight gain Side effects that usually do not require medical attention (report to your prescriber or health care professional if they continue or are bothersome): -bone pain -complete hair loss including hair on your head, underarms, pubic hair, eyebrows, and eyelashes -diarrhea -excessive tearing -changes in the color of fingernails -loosening of the fingernails -nausea -muscle pain -red flush to skin -sweating -weak or tired This list may not describe all possible side effects. Call your doctor for medical advice about side effects. You may report side effects to FDA at 1-800-FDA-1088. Where should I keep my medicine? This drug is given in a hospital or clinic and will not be stored at home. NOTE: This sheet is a summary. It may not cover all possible information. If you have questions about this medicine, talk to your doctor, pharmacist, or health care provider.  2015, [employer]/Gold Standard. (2013-03-26 22:21:02)   Cetuximab injection What is this medicine? CETUXIMAB (se TUX i mab) is a chemotherapy drug. It targets a specific protein within cancer cells and stops the cells from growing. It is used to treat colorectal cancer and head and neck cancer. This medicine may be used for other purposes; ask your health care provider or pharmacist if you have questions. COMMON BRAND NAME(S): Erbitux What should  I tell my health care provider before I take this medicine? They need to know if you have any of these conditions: -heart disease -history of irregular heartbeat -history of low levels of calcium, magnesium, or potassium in the blood -lung or breathing disease, like asthma -an unusual or allergic reaction to cetuximab, other medicines, foods, dyes, or preservatives -pregnant or trying to get pregnant -breast-feeding How should I use this medicine? This drug is given as an infusion into a vein. It is administered in a hospital or clinic by a specially trained health care professional. Talk to your pediatrician regarding the use of this medicine in children. Special care may be needed. Overdosage: If you think you have taken too much of this medicine contact a poison control center or emergency room at once. NOTE: This medicine is only for you. Do not share this medicine with others. What if I miss a dose? It is important not to miss your dose. Call your doctor or health care professional if you are unable to keep an appointment. What may interact with this medicine? Interactions are not expected.  This list may not describe all possible interactions. Give your health care provider a list of all the medicines, herbs, non-prescription drugs, or dietary supplements you use. Also tell them if you smoke, drink alcohol, or use illegal drugs. Some items may interact with your medicine. What should I watch for while using this medicine? Visit your doctor or health care professional for regular checks on your progress. This drug may make you feel generally unwell. This is not uncommon, as chemotherapy can affect healthy cells as well as cancer cells. Report any side effects. Continue your course of treatment even though you feel ill unless your doctor tells you to stop. This medicine can make you more sensitive to the sun. Keep out of the sun while taking this medicine and for 2 months after the last dose.  If you cannot avoid being in the sun, wear protective clothing and use sunscreen. Do not use sun lamps or tanning beds/booths. You may need blood work done while you are taking this medicine. In some cases, you may be given additional medicines to help with side effects. Follow all directions for their use. Call your doctor or health care professional for advice if you get a fever, chills or sore throat, or other symptoms of a cold or flu. Do not treat yourself. This drug decreases your body's ability to fight infections. Try to avoid being around people who are sick. Avoid taking products that contain aspirin, acetaminophen, ibuprofen, naproxen, or ketoprofen unless instructed by your doctor. These medicines may hide a fever. Do not become pregnant while taking this medicine. Women should inform their doctor if they wish to become pregnant or think they might be pregnant. There is a potential for serious side effects to an unborn child. Use adequate birth control methods. Avoid pregnancy for at least 6 months after your last dose. Talk to your health care professional or pharmacist for more information. Do not breast-feed an infant while taking this medicine or during the 2 months after your last dose. What side effects may I notice from receiving this medicine? Side effects that you should report to your doctor or health care professional as soon as possible: -allergic reactions like skin rash, itching or hives, swelling of the face, lips, or tongue -breathing problems -changes in vision -fast, irregular heartbeat -feeling faint or lightheaded, falls -fever, chills -mouth sores -redness, blistering, peeling or loosening of the skin, including inside the mouth -trouble passing urine or change in the amount of urine -unusually weak or tired Side effects that usually do not require medical attention (report to your doctor or health care professional if they continue or are bothersome): -changes in  skin like acne, cracks, skin dryness -constipation -diarrhea -headache -nail changes -nausea, vomiting -stomach upset -weight loss This list may not describe all possible side effects. Call your doctor for medical advice about side effects. You may report side effects to FDA at 1-800-FDA-1088. Where should I keep my medicine? This drug is given in a hospital or clinic and will not be stored at home. NOTE: This sheet is a summary. It may not cover all possible information. If you have questions about this medicine, talk to your doctor, pharmacist, or health care provider.  2015, [employer] Standard. (2013-08-12 16:14:34)

## 2015-03-01 ENCOUNTER — Encounter: Payer: Self-pay | Admitting: Hematology & Oncology

## 2015-03-01 ENCOUNTER — Other Ambulatory Visit (HOSPITAL_BASED_OUTPATIENT_CLINIC_OR_DEPARTMENT_OTHER): Payer: Medicaid Other

## 2015-03-01 ENCOUNTER — Ambulatory Visit (HOSPITAL_BASED_OUTPATIENT_CLINIC_OR_DEPARTMENT_OTHER): Payer: Medicaid Other

## 2015-03-01 ENCOUNTER — Ambulatory Visit (HOSPITAL_BASED_OUTPATIENT_CLINIC_OR_DEPARTMENT_OTHER): Payer: Medicaid Other | Admitting: Hematology & Oncology

## 2015-03-01 VITALS — BP 98/65 | HR 81 | Temp 97.8°F | Resp 16 | Ht 70.0 in | Wt 120.0 lb

## 2015-03-01 DIAGNOSIS — C029 Malignant neoplasm of tongue, unspecified: Secondary | ICD-10-CM | POA: Diagnosis not present

## 2015-03-01 DIAGNOSIS — Z5111 Encounter for antineoplastic chemotherapy: Secondary | ICD-10-CM | POA: Diagnosis present

## 2015-03-01 DIAGNOSIS — C069 Malignant neoplasm of mouth, unspecified: Secondary | ICD-10-CM

## 2015-03-01 DIAGNOSIS — Z5112 Encounter for antineoplastic immunotherapy: Secondary | ICD-10-CM

## 2015-03-01 DIAGNOSIS — C760 Malignant neoplasm of head, face and neck: Secondary | ICD-10-CM | POA: Diagnosis not present

## 2015-03-01 LAB — CBC WITH DIFFERENTIAL (CANCER CENTER ONLY)
BASO#: 0 10*3/uL (ref 0.0–0.2)
BASO%: 0.2 % (ref 0.0–2.0)
EOS%: 0 % (ref 0.0–7.0)
Eosinophils Absolute: 0 10*3/uL (ref 0.0–0.5)
HEMATOCRIT: 28.8 % — AB (ref 38.7–49.9)
HEMOGLOBIN: 9.6 g/dL — AB (ref 13.0–17.1)
LYMPH#: 0.5 10*3/uL — AB (ref 0.9–3.3)
LYMPH%: 11.6 % — ABNORMAL LOW (ref 14.0–48.0)
MCH: 33.1 pg (ref 28.0–33.4)
MCHC: 33.3 g/dL (ref 32.0–35.9)
MCV: 99 fL — ABNORMAL HIGH (ref 82–98)
MONO#: 0.8 10*3/uL (ref 0.1–0.9)
MONO%: 18.5 % — ABNORMAL HIGH (ref 0.0–13.0)
NEUT%: 69.7 % (ref 40.0–80.0)
NEUTROS ABS: 3.1 10*3/uL (ref 1.5–6.5)
Platelets: 210 10*3/uL (ref 145–400)
RBC: 2.9 10*6/uL — ABNORMAL LOW (ref 4.20–5.70)
RDW: 13.7 % (ref 11.1–15.7)
WBC: 4.5 10*3/uL (ref 4.0–10.0)

## 2015-03-01 LAB — CMP (CANCER CENTER ONLY)
ALBUMIN: 3.4 g/dL (ref 3.3–5.5)
ALK PHOS: 70 U/L (ref 26–84)
ALT: 22 U/L (ref 10–47)
AST: 23 U/L (ref 11–38)
BUN, Bld: 21 mg/dL (ref 7–22)
CALCIUM: 9.4 mg/dL (ref 8.0–10.3)
CO2: 33 mEq/L (ref 18–33)
Chloride: 94 mEq/L — ABNORMAL LOW (ref 98–108)
Creat: 0.8 mg/dl (ref 0.6–1.2)
Glucose, Bld: 92 mg/dL (ref 73–118)
Potassium: 4.2 mEq/L (ref 3.3–4.7)
Sodium: 134 mEq/L (ref 128–145)
TOTAL PROTEIN: 7.1 g/dL (ref 6.4–8.1)
Total Bilirubin: 0.4 mg/dl (ref 0.20–1.60)

## 2015-03-01 LAB — MAGNESIUM (CC13): Magnesium: 1.7 mg/dl (ref 1.5–2.5)

## 2015-03-01 MED ORDER — HYDROMORPHONE HCL 4 MG/ML IJ SOLN
4.0000 mg | INTRAMUSCULAR | Status: DC | PRN
  Administered 2015-03-01: 4 mg via INTRAVENOUS

## 2015-03-01 MED ORDER — DOCETAXEL CHEMO INJECTION 160 MG/16ML
65.0000 mg/m2 | Freq: Once | INTRAVENOUS | Status: AC
  Administered 2015-03-01: 110 mg via INTRAVENOUS
  Filled 2015-03-01: qty 11

## 2015-03-01 MED ORDER — DIPHENHYDRAMINE HCL 50 MG/ML IJ SOLN
INTRAMUSCULAR | Status: AC
  Filled 2015-03-01: qty 1

## 2015-03-01 MED ORDER — SODIUM CHLORIDE 0.9 % IV SOLN
Freq: Once | INTRAVENOUS | Status: AC
  Administered 2015-03-01: 09:00:00 via INTRAVENOUS

## 2015-03-01 MED ORDER — DIPHENHYDRAMINE HCL 50 MG/ML IJ SOLN
50.0000 mg | Freq: Once | INTRAMUSCULAR | Status: AC
  Administered 2015-03-01: 50 mg via INTRAVENOUS

## 2015-03-01 MED ORDER — HYDROMORPHONE HCL 4 MG/ML IJ SOLN
INTRAMUSCULAR | Status: AC
  Filled 2015-03-01: qty 1

## 2015-03-01 MED ORDER — CETUXIMAB CHEMO IV INJECTION 200 MG/100ML
250.0000 mg/m2 | Freq: Once | INTRAVENOUS | Status: AC
  Administered 2015-03-01: 400 mg via INTRAVENOUS
  Filled 2015-03-01: qty 200

## 2015-03-01 MED ORDER — CARBOPLATIN CHEMO INTRADERMAL TEST DOSE 100MCG/0.02ML
100.0000 ug | Freq: Once | INTRADERMAL | Status: AC
  Administered 2015-03-01: 100 ug via INTRADERMAL
  Filled 2015-03-01: qty 0.02

## 2015-03-01 MED ORDER — PALONOSETRON HCL INJECTION 0.25 MG/5ML
INTRAVENOUS | Status: AC
  Filled 2015-03-01: qty 5

## 2015-03-01 MED ORDER — SODIUM CHLORIDE 0.9 % IV SOLN
550.0000 mg | Freq: Once | INTRAVENOUS | Status: AC
  Administered 2015-03-01: 550 mg via INTRAVENOUS
  Filled 2015-03-01: qty 55

## 2015-03-01 MED ORDER — OXYCODONE HCL 30 MG PO TABS: 30.0000 mg | ORAL_TABLET | ORAL | Status: DC | PRN

## 2015-03-01 MED ORDER — PALONOSETRON HCL INJECTION 0.25 MG/5ML
0.2500 mg | Freq: Once | INTRAVENOUS | Status: AC
  Administered 2015-03-01: 0.25 mg via INTRAVENOUS

## 2015-03-01 MED ORDER — SODIUM CHLORIDE 0.9 % IV SOLN
Freq: Once | INTRAVENOUS | Status: AC
  Administered 2015-03-01: 12:00:00 via INTRAVENOUS
  Filled 2015-03-01: qty 5

## 2015-03-01 NOTE — Patient Instructions (Signed)
[facility] Discharge Instructions for Patients Receiving Chemotherapy  Today you received the following chemotherapy agents ERBITUX, Carboplatin, Taxotere  To help prevent nausea and vomiting after your treatment, we encourage you to take your nausea medication  1) Zofran (Ondansetron) 8 mg.  Take 1 tablet in the morning and one in the evening beginning day after chemotherapy.  Take for 3 days.  2) Decadron - Continue taking Decadron for 3 days after chemotherapy.  3) Compazine (Prochlorperazine) Take 1 tablet by mouth every 6 hours AS NEEDED for nausea.     If you develop nausea and vomiting that is not controlled by your nausea medication, call the clinic.   BELOW ARE SYMPTOMS THAT SHOULD BE REPORTED IMMEDIATELY:  *FEVER GREATER THAN 100.5 F  *CHILLS WITH OR WITHOUT FEVER  NAUSEA AND VOMITING THAT IS NOT CONTROLLED WITH YOUR NAUSEA MEDICATION  *UNUSUAL SHORTNESS OF BREATH  *UNUSUAL BRUISING OR BLEEDING  TENDERNESS IN MOUTH AND THROAT WITH OR WITHOUT PRESENCE OF ULCERS  *URINARY PROBLEMS  *BOWEL PROBLEMS  UNUSUAL RASH Items with * indicate a potential emergency and should be followed up as soon as possible.  Feel free to call the clinic you have any questions or concerns. The clinic phone number is [phone].  Please show the [facility] at check-in to the Emergency Department and triage nurse.      Carboplatin injection What is this medicine? CARBOPLATIN (KAR boe pla tin) is a chemotherapy drug. It targets fast dividing cells, like cancer cells, and causes these cells to die. This medicine is used to treat ovarian cancer and many other cancers. This medicine may be used for other purposes; ask your health care provider or pharmacist if you have questions. COMMON BRAND NAME(S): Paraplatin What should I tell my health care provider before I take this medicine? They need to know if you have any of these conditions: -blood disorders -hearing  problems -kidney disease -recent or ongoing radiation therapy -an unusual or allergic reaction to carboplatin, cisplatin, other chemotherapy, other medicines, foods, dyes, or preservatives -pregnant or trying to get pregnant -breast-feeding How should I use this medicine? This drug is usually given as an infusion into a vein. It is administered in a hospital or clinic by a specially trained health care professional. Talk to your pediatrician regarding the use of this medicine in children. Special care may be needed. Overdosage: If you think you have taken too much of this medicine contact a poison control center or emergency room at once. NOTE: This medicine is only for you. Do not share this medicine with others. What if I miss a dose? It is important not to miss a dose. Call your doctor or health care professional if you are unable to keep an appointment. What may interact with this medicine? -medicines for seizures -medicines to increase blood counts like filgrastim, pegfilgrastim, sargramostim -some antibiotics like amikacin, gentamicin, neomycin, streptomycin, tobramycin -vaccines Talk to your doctor or health care professional before taking any of these medicines: -acetaminophen -aspirin -ibuprofen -ketoprofen -naproxen This list may not describe all possible interactions. Give your health care provider a list of all the medicines, herbs, non-prescription drugs, or dietary supplements you use. Also tell them if you smoke, drink alcohol, or use illegal drugs. Some items may interact with your medicine. What should I watch for while using this medicine? Your condition will be monitored carefully while you are receiving this medicine. You will need important blood work done while you are taking this medicine.  This drug may make you feel generally unwell. This is not uncommon, as chemotherapy can affect healthy cells as well as cancer cells. Report any side effects. Continue your course  of treatment even though you feel ill unless your doctor tells you to stop. In some cases, you may be given additional medicines to help with side effects. Follow all directions for their use. Call your doctor or health care professional for advice if you get a fever, chills or sore throat, or other symptoms of a cold or flu. Do not treat yourself. This drug decreases your body's ability to fight infections. Try to avoid being around people who are sick. This medicine may increase your risk to bruise or bleed. Call your doctor or health care professional if you notice any unusual bleeding. Be careful brushing and flossing your teeth or using a toothpick because you may get an infection or bleed more easily. If you have any dental work done, tell your dentist you are receiving this medicine. Avoid taking products that contain aspirin, acetaminophen, ibuprofen, naproxen, or ketoprofen unless instructed by your doctor. These medicines may hide a fever. Do not become pregnant while taking this medicine. Women should inform their doctor if they wish to become pregnant or think they might be pregnant. There is a potential for serious side effects to an unborn child. Talk to your health care professional or pharmacist for more information. Do not breast-feed an infant while taking this medicine. What side effects may I notice from receiving this medicine? Side effects that you should report to your doctor or health care professional as soon as possible: -allergic reactions like skin rash, itching or hives, swelling of the face, lips, or tongue -signs of infection - fever or chills, cough, sore throat, pain or difficulty passing urine -signs of decreased platelets or bleeding - bruising, pinpoint red spots on the skin, black, tarry stools, nosebleeds -signs of decreased red blood cells - unusually weak or tired, fainting spells, lightheadedness -breathing problems -changes in hearing -changes in  vision -chest pain -high blood pressure -low blood counts - This drug may decrease the number of white blood cells, red blood cells and platelets. You may be at increased risk for infections and bleeding. -nausea and vomiting -pain, swelling, redness or irritation at the injection site -pain, tingling, numbness in the hands or feet -problems with balance, talking, walking -trouble passing urine or change in the amount of urine Side effects that usually do not require medical attention (report to your doctor or health care professional if they continue or are bothersome): -hair loss -loss of appetite -metallic taste in the mouth or changes in taste This list may not describe all possible side effects. Call your doctor for medical advice about side effects. You may report side effects to FDA at 1-800-FDA-1088. Where should I keep my medicine? This drug is given in a hospital or clinic and will not be stored at home. NOTE: This sheet is a summary. It may not cover all possible information. If you have questions about this medicine, talk to your doctor, pharmacist, or health care provider.  2015, Elsevier/Gold Standard. (2007-08-05 14:38:05)   Docetaxel injection What is this medicine? DOCETAXEL (doe se TAX el) is a chemotherapy drug. It targets fast dividing cells, like cancer cells, and causes these cells to die. This medicine is used to treat many types of cancers like breast cancer, certain stomach cancers, head and neck cancer, lung cancer, and prostate cancer. This medicine may be used  for other purposes; ask your health care provider or pharmacist if you have questions. COMMON BRAND NAME(S): Docefrez, Taxotere What should I tell my health care provider before I take this medicine? They need to know if you have any of these conditions: -infection (especially a virus infection such as chickenpox, cold sores, or herpes) -liver disease -low blood counts, like low white cell, platelet, or  red cell counts -an unusual or allergic reaction to docetaxel, polysorbate 80, other chemotherapy agents, other medicines, foods, dyes, or preservatives -pregnant or trying to get pregnant -breast-feeding How should I use this medicine? This drug is given as an infusion into a vein. It is administered in a hospital or clinic by a specially trained health care professional. Talk to your pediatrician regarding the use of this medicine in children. Special care may be needed. Overdosage: If you think you have taken too much of this medicine contact a poison control center or emergency room at once. NOTE: This medicine is only for you. Do not share this medicine with others. What if I miss a dose? It is important not to miss your dose. Call your doctor or health care professional if you are unable to keep an appointment. What may interact with this medicine? -cyclosporine -erythromycin -ketoconazole -medicines to increase blood counts like filgrastim, pegfilgrastim, sargramostim -vaccines Talk to your doctor or health care professional before taking any of these medicines: -acetaminophen -aspirin -ibuprofen -ketoprofen -naproxen This list may not describe all possible interactions. Give your health care provider a list of all the medicines, herbs, non-prescription drugs, or dietary supplements you use. Also tell them if you smoke, drink alcohol, or use illegal drugs. Some items may interact with your medicine. What should I watch for while using this medicine? Your condition will be monitored carefully while you are receiving this medicine. You will need important blood work done while you are taking this medicine. This drug may make you feel generally unwell. This is not uncommon, as chemotherapy can affect healthy cells as well as cancer cells. Report any side effects. Continue your course of treatment even though you feel ill unless your doctor tells you to stop. In some cases, you may be  given additional medicines to help with side effects. Follow all directions for their use. Call your doctor or health care professional for advice if you get a fever, chills or sore throat, or other symptoms of a cold or flu. Do not treat yourself. This drug decreases your body's ability to fight infections. Try to avoid being around people who are sick. This medicine may increase your risk to bruise or bleed. Call your doctor or health care professional if you notice any unusual bleeding. Be careful brushing and flossing your teeth or using a toothpick because you may get an infection or bleed more easily. If you have any dental work done, tell your dentist you are receiving this medicine. Avoid taking products that contain aspirin, acetaminophen, ibuprofen, naproxen, or ketoprofen unless instructed by your doctor. These medicines may hide a fever. This medicine contains an alcohol in the product. You may get drowsy or dizzy. Do not drive, use machinery, or do anything that needs mental alertness until you know how this medicine affects you. Do not stand or sit up quickly, especially if you are an older patient. This reduces the risk of dizzy or fainting spells. Avoid alcoholic drinks Do not become pregnant while taking this medicine. Women should inform their doctor if they wish to become pregnant or  think they might be pregnant. There is a potential for serious side effects to an unborn child. Talk to your health care professional or pharmacist for more information. Do not breast-feed an infant while taking this medicine. What side effects may I notice from receiving this medicine? Side effects that you should report to your doctor or health care professional as soon as possible: -allergic reactions like skin rash, itching or hives, swelling of the face, lips, or tongue -low blood counts - This drug may decrease the number of white blood cells, red blood cells and platelets. You may be at increased risk  for infections and bleeding. -signs of infection - fever or chills, cough, sore throat, pain or difficulty passing urine -signs of decreased platelets or bleeding - bruising, pinpoint red spots on the skin, black, tarry stools, nosebleeds -signs of decreased red blood cells - unusually weak or tired, fainting spells, lightheadedness -breathing problems -fast or irregular heartbeat -low blood pressure -mouth sores -nausea and vomiting -pain, swelling, redness or irritation at the injection site -pain, tingling, numbness in the hands or feet -swelling of the ankle, feet, hands -weight gain Side effects that usually do not require medical attention (report to your prescriber or health care professional if they continue or are bothersome): -bone pain -complete hair loss including hair on your head, underarms, pubic hair, eyebrows, and eyelashes -diarrhea -excessive tearing -changes in the color of fingernails -loosening of the fingernails -nausea -muscle pain -red flush to skin -sweating -weak or tired This list may not describe all possible side effects. Call your doctor for medical advice about side effects. You may report side effects to FDA at 1-800-FDA-1088. Where should I keep my medicine? This drug is given in a hospital or clinic and will not be stored at home. NOTE: This sheet is a summary. It may not cover all possible information. If you have questions about this medicine, talk to your doctor, pharmacist, or health care provider.  2015, Elsevier/Gold Standard. (2013-03-26 22:21:02)   Cetuximab injection What is this medicine? CETUXIMAB (se TUX i mab) is a chemotherapy drug. It targets a specific protein within cancer cells and stops the cells from growing. It is used to treat colorectal cancer and head and neck cancer. This medicine may be used for other purposes; ask your health care provider or pharmacist if you have questions. COMMON BRAND NAME(S): Erbitux What should  I tell my health care provider before I take this medicine? They need to know if you have any of these conditions: -heart disease -history of irregular heartbeat -history of low levels of calcium, magnesium, or potassium in the blood -lung or breathing disease, like asthma -an unusual or allergic reaction to cetuximab, other medicines, foods, dyes, or preservatives -pregnant or trying to get pregnant -breast-feeding How should I use this medicine? This drug is given as an infusion into a vein. It is administered in a hospital or clinic by a specially trained health care professional. Talk to your pediatrician regarding the use of this medicine in children. Special care may be needed. Overdosage: If you think you have taken too much of this medicine contact a poison control center or emergency room at once. NOTE: This medicine is only for you. Do not share this medicine with others. What if I miss a dose? It is important not to miss your dose. Call your doctor or health care professional if you are unable to keep an appointment. What may interact with this medicine? Interactions are not expected.  This list may not describe all possible interactions. Give your health care provider a list of all the medicines, herbs, non-prescription drugs, or dietary supplements you use. Also tell them if you smoke, drink alcohol, or use illegal drugs. Some items may interact with your medicine. What should I watch for while using this medicine? Visit your doctor or health care professional for regular checks on your progress. This drug may make you feel generally unwell. This is not uncommon, as chemotherapy can affect healthy cells as well as cancer cells. Report any side effects. Continue your course of treatment even though you feel ill unless your doctor tells you to stop. This medicine can make you more sensitive to the sun. Keep out of the sun while taking this medicine and for 2 months after the last dose.  If you cannot avoid being in the sun, wear protective clothing and use sunscreen. Do not use sun lamps or tanning beds/booths. You may need blood work done while you are taking this medicine. In some cases, you may be given additional medicines to help with side effects. Follow all directions for their use. Call your doctor or health care professional for advice if you get a fever, chills or sore throat, or other symptoms of a cold or flu. Do not treat yourself. This drug decreases your body's ability to fight infections. Try to avoid being around people who are sick. Avoid taking products that contain aspirin, acetaminophen, ibuprofen, naproxen, or ketoprofen unless instructed by your doctor. These medicines may hide a fever. Do not become pregnant while taking this medicine. Women should inform their doctor if they wish to become pregnant or think they might be pregnant. There is a potential for serious side effects to an unborn child. Use adequate birth control methods. Avoid pregnancy for at least 6 months after your last dose. Talk to your health care professional or pharmacist for more information. Do not breast-feed an infant while taking this medicine or during the 2 months after your last dose. What side effects may I notice from receiving this medicine? Side effects that you should report to your doctor or health care professional as soon as possible: -allergic reactions like skin rash, itching or hives, swelling of the face, lips, or tongue -breathing problems -changes in vision -fast, irregular heartbeat -feeling faint or lightheaded, falls -fever, chills -mouth sores -redness, blistering, peeling or loosening of the skin, including inside the mouth -trouble passing urine or change in the amount of urine -unusually weak or tired Side effects that usually do not require medical attention (report to your doctor or health care professional if they continue or are bothersome): -changes in  skin like acne, cracks, skin dryness -constipation -diarrhea -headache -nail changes -nausea, vomiting -stomach upset -weight loss This list may not describe all possible side effects. Call your doctor for medical advice about side effects. You may report side effects to FDA at 1-800-FDA-1088. Where should I keep my medicine? This drug is given in a hospital or clinic and will not be stored at home. NOTE: This sheet is a summary. It may not cover all possible information. If you have questions about this medicine, talk to your doctor, pharmacist, or health care provider.  2015, Elsevier/Gold Standard. (2013-08-12 16:14:34)

## 2015-03-01 NOTE — Progress Notes (Signed)
Hematology and Oncology Follow Up Visit  Derek Campos [id number] Mar 16, 1971 44 y.o. 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 He actually looks quite good. He still have problems with pain in his mouth. I will adjust his Dilaudid dosing.  We did go ahead and repeat his scans. Thankfully, his scans show that he was responding quite nicely. He had very nice resolution of his mediastinal disease.  He is not complaining of any back pain.  He's had no diarrhea or constipation. If he does not want any Neulasta. He says it just makes him sick.  He seems to be eating a little bit better. He has a feeding tube in. He saw as a very hard time swallowing. Thankfully  , his weight is holding steady.  Overall, his performance status is ECOG 2.   Medications:  Current outpatient prescriptions:  .  Alum & Mag Hydroxide-Simeth (MAGIC MOUTHWASH W/LIDOCAINE) SOLN, Take 15 mLs by mouth 4 (four) times daily as needed for mouth pain., Disp: 500 mL, Rfl: 2 .  fentaNYL (DURAGESIC - DOSED MCG/HR) 75 MCG/HR, Place 2 patches (150 mcg total) onto the skin every 3 (three) days., Disp: 20 patch, Rfl: 0 .  gabapentin (NEURONTIN) 600 MG tablet, Take 1 tablet (600 mg total) by mouth 3 (three) times daily., Disp: 90 tablet, Rfl: 4 .  HYDROmorphone (DILAUDID) 4 MG tablet, Take 1-2 pills, if needed, every 4 hr for pain, Disp: 180 tablet, Rfl: 0 .  lidocaine (XYLOCAINE) 2 % solution, Use as directed 20 mLs in the mouth or throat every 3 (three) hours as needed for mouth pain., Disp: 1000 mL, Rfl: 1 .  oxycodone (ROXICODONE) 30 MG immediate release tablet, Take 1 tablet (30 mg total) by mouth every 4 (four) hours as needed for pain., Disp: 120 tablet, Rfl: 0 .  Potassium  Chloride 40 MEQ/15ML (20%) SOLN, Take 15 mLs by mouth 2 (two) times daily., Disp: 473 mL, Rfl: 1 .  promethazine (PHENERGAN) 25 MG tablet, Take 1 tablet (25 mg total) by mouth every 6 (six) hours as needed for nausea or vomiting., Disp: 90 tablet, Rfl: 0 .  ranitidine (ZANTAC) 150 MG tablet, Take 1 tablet (150 mg total) by mouth 2 (two) times daily. Crush and put down feeding tube., Disp: 60 tablet, Rfl: 3  Allergies:  Allergies  Allergen Reactions  . Other     Cat Gut Stitches - unknown reaction.Told by parents.    Past Medical History, Surgical history, Social history, and Family History were reviewed and updated.  Review of Systems: As above  Physical Exam:  height is 5\' 10"  (1.778 m) and weight is 120 lb (54.432 kg). His oral temperature is 97.8 F (36.6 C). His blood pressure is 98/65 and his pulse is 81. His respiration is 16.   Wt Readings from Last 3 Encounters:  03/01/15 120 lb (54.432 kg)  02/15/15 112 lb (50.803 kg)  02/08/15 121 lb (54.885 kg)     Thin white gentleman. He is in no obvious distress. Head and neck exam shows his tongue to be significantly less inflamed and disfigured. There is not as much obvious tumor that is noted on the tongue. . 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 Surprisingly there is no  adenopathy in his neck. Pupils react appropriately. Lungs are clear. Cardiac exam regular rate and rhythm with no murmurs, rubs or bruits. Abdomen is soft. Has a feeding tube as intact. There is no fluid wave. There is no palpable liver or spleen tip. Extremities shows no clubbing, cyanosis or edema. He has good strength in his extremities. He has good range of motion of his joints. Skin exam shows no rashes, ecchymoses or petechia. Neurological exam is nonfocal.  Lab Results  Component Value Date   WBC 4.5 03/01/2015   HGB 9.6* 03/01/2015   HCT 28.8* 03/01/2015   MCV 99* 03/01/2015   PLT 210 03/01/2015     Chemistry      Component Value Date/Time    NA 135 02/22/2015 0911   NA 139 12/03/2014 0629   NA 137 10/25/2014 1018   K 4.1 02/22/2015 0911   K 3.7 12/03/2014 0629   K 4.1 10/25/2014 1018   CL 93* 02/22/2015 0911   CL 103 12/03/2014 0629   CO2 32 02/22/2015 0911   CO2 26 12/03/2014 0629   CO2 26 10/25/2014 1018   BUN 16 02/22/2015 0911   BUN 18 12/03/2014 0629   BUN 15.3 10/25/2014 1018   CREATININE 0.9 02/22/2015 0911   CREATININE 0.83 12/03/2014 0629   CREATININE 0.7 10/25/2014 1018      Component Value Date/Time   CALCIUM 9.1 02/22/2015 0911   CALCIUM 8.6* 12/03/2014 0629   CALCIUM 9.3 10/25/2014 1018   ALKPHOS 75 02/22/2015 0911   ALKPHOS 110 12/02/2014 1504   ALKPHOS 74 10/25/2014 1018   AST 21 02/22/2015 0911   AST 29 12/02/2014 1504   AST 16 10/25/2014 1018   ALT 21 02/22/2015 0911   ALT 18 12/02/2014 1504   ALT 12 10/25/2014 1018   BILITOT 0.50 02/22/2015 0911   BILITOT 0.9 12/02/2014 1504   BILITOT 0.20 10/25/2014 1018         Impression and Plan: Derek Campos is a 44 year old white gentleman with metastatic head and neck cancer.  His tongue looks a whole lot better.  I'm glad that his skin looks better.  At some point, I think we will have to give him a "vacation" from treatment.  I want to try to get him through October. I think that once we him into November, we might be able to get him through the holidays without having to treat him. I think this would make life better for him. We are not going to cure this so I want to make sure that we focus on his quality of life.   We will go ahead and plan to give him back in 3 weeks. He comes back weekly for his Erbitux.  Probably in December, I would rescan him.  If we find that he has progressive disease, then I think neck soft and would be immunotherapy with Keytruda.           Volanda Napoleon, MD 10/18/20168:58 AM

## 2015-03-01 NOTE — Addendum Note (Signed)
Addended by: Burney Gauze R on: 03/01/2015 11:12 AM   Modules accepted: Orders

## 2015-03-04 ENCOUNTER — Other Ambulatory Visit: Payer: Self-pay

## 2015-03-04 DIAGNOSIS — C029 Malignant neoplasm of tongue, unspecified: Secondary | ICD-10-CM

## 2015-03-04 DIAGNOSIS — C069 Malignant neoplasm of mouth, unspecified: Secondary | ICD-10-CM

## 2015-03-04 DIAGNOSIS — D509 Iron deficiency anemia, unspecified: Secondary | ICD-10-CM

## 2015-03-04 DIAGNOSIS — K909 Intestinal malabsorption, unspecified: Secondary | ICD-10-CM

## 2015-03-04 MED ORDER — GABAPENTIN 600 MG PO TABS: 600.0000 mg | ORAL_TABLET | Freq: Three times a day (TID) | ORAL | Status: AC

## 2015-03-07 ENCOUNTER — Other Ambulatory Visit: Payer: Self-pay | Admitting: Emergency Medicine

## 2015-03-07 DIAGNOSIS — C770 Secondary and unspecified malignant neoplasm of lymph nodes of head, face and neck: Secondary | ICD-10-CM

## 2015-03-08 ENCOUNTER — Other Ambulatory Visit (HOSPITAL_BASED_OUTPATIENT_CLINIC_OR_DEPARTMENT_OTHER): Payer: Medicaid Other

## 2015-03-08 ENCOUNTER — Ambulatory Visit (HOSPITAL_BASED_OUTPATIENT_CLINIC_OR_DEPARTMENT_OTHER): Payer: Medicaid Other

## 2015-03-08 ENCOUNTER — Other Ambulatory Visit: Payer: Self-pay | Admitting: Nurse Practitioner

## 2015-03-08 VITALS — BP 119/78 | HR 93 | Temp 99.5°F | Resp 18

## 2015-03-08 DIAGNOSIS — C770 Secondary and unspecified malignant neoplasm of lymph nodes of head, face and neck: Secondary | ICD-10-CM

## 2015-03-08 DIAGNOSIS — Z5189 Encounter for other specified aftercare: Secondary | ICD-10-CM | POA: Diagnosis present

## 2015-03-08 DIAGNOSIS — C760 Malignant neoplasm of head, face and neck: Secondary | ICD-10-CM | POA: Diagnosis not present

## 2015-03-08 DIAGNOSIS — C069 Malignant neoplasm of mouth, unspecified: Secondary | ICD-10-CM

## 2015-03-08 DIAGNOSIS — E860 Dehydration: Secondary | ICD-10-CM

## 2015-03-08 DIAGNOSIS — C029 Malignant neoplasm of tongue, unspecified: Secondary | ICD-10-CM

## 2015-03-08 LAB — CBC WITH DIFFERENTIAL (CANCER CENTER ONLY)
BASO#: 0 10*3/uL (ref 0.0–0.2)
BASO%: 1.1 % (ref 0.0–2.0)
EOS ABS: 0 10*3/uL (ref 0.0–0.5)
EOS%: 0 % (ref 0.0–7.0)
HEMATOCRIT: 28.2 % — AB (ref 38.7–49.9)
HGB: 9.5 g/dL — ABNORMAL LOW (ref 13.0–17.1)
LYMPH#: 0.2 10*3/uL — AB (ref 0.9–3.3)
LYMPH%: 16 % (ref 14.0–48.0)
MCH: 32.6 pg (ref 28.0–33.4)
MCHC: 33.7 g/dL (ref 32.0–35.9)
MCV: 97 fL (ref 82–98)
MONO#: 0.2 10*3/uL (ref 0.1–0.9)
MONO%: 19.1 % — ABNORMAL HIGH (ref 0.0–13.0)
NEUT#: 0.6 10*3/uL — ABNORMAL LOW (ref 1.5–6.5)
NEUT%: 63.8 % (ref 40.0–80.0)
PLATELETS: 170 10*3/uL (ref 145–400)
RBC: 2.91 10*6/uL — ABNORMAL LOW (ref 4.20–5.70)
RDW: 13.2 % (ref 11.1–15.7)
WBC: 0.9 10*3/uL — CL (ref 4.0–10.0)

## 2015-03-08 LAB — CMP (CANCER CENTER ONLY)
ALT(SGPT): 19 U/L (ref 10–47)
AST: 24 U/L (ref 11–38)
Albumin: 3.2 g/dL — ABNORMAL LOW (ref 3.3–5.5)
Alkaline Phosphatase: 73 U/L (ref 26–84)
BUN, Bld: 15 mg/dL (ref 7–22)
CALCIUM: 9.5 mg/dL (ref 8.0–10.3)
CHLORIDE: 93 meq/L — AB (ref 98–108)
CO2: 32 meq/L (ref 18–33)
Creat: 0.7 mg/dl (ref 0.6–1.2)
GLUCOSE: 99 mg/dL (ref 73–118)
POTASSIUM: 4.3 meq/L (ref 3.3–4.7)
Sodium: 134 mEq/L (ref 128–145)
Total Bilirubin: 0.4 mg/dl (ref 0.20–1.60)
Total Protein: 7.1 g/dL (ref 6.4–8.1)

## 2015-03-08 LAB — MAGNESIUM (CC13): MAGNESIUM: 1.2 mg/dL — AB (ref 1.5–2.5)

## 2015-03-08 MED ORDER — SODIUM CHLORIDE 0.9 % IV SOLN
1000.0000 mL | INTRAVENOUS | Status: DC
  Administered 2015-03-08: 10:00:00 via INTRAVENOUS

## 2015-03-08 MED ORDER — PALONOSETRON HCL INJECTION 0.25 MG/5ML
0.2500 mg | Freq: Once | INTRAVENOUS | Status: AC
  Administered 2015-03-08: 0.25 mg via INTRAVENOUS

## 2015-03-08 MED ORDER — SODIUM CHLORIDE 0.9 % IV SOLN
10.0000 mg | Freq: Once | INTRAVENOUS | Status: AC
  Administered 2015-03-08: 10 mg via INTRAVENOUS
  Filled 2015-03-08: qty 1

## 2015-03-08 MED ORDER — DEXAMETHASONE 4 MG PO TABS: 8.0000 mg | ORAL_TABLET | Freq: Two times a day (BID) | ORAL | Status: AC

## 2015-03-08 MED ORDER — PALONOSETRON HCL INJECTION 0.25 MG/5ML
INTRAVENOUS | Status: AC
  Filled 2015-03-08: qty 5

## 2015-03-08 MED ORDER — HYDROMORPHONE HCL 4 MG/ML IJ SOLN
4.0000 mg | Freq: Once | INTRAMUSCULAR | Status: AC
  Administered 2015-03-08: 4 mg via INTRAVENOUS

## 2015-03-08 MED ORDER — LORAZEPAM 0.5 MG PO TABS: 0.5000 mg | ORAL_TABLET | Freq: Three times a day (TID) | ORAL | Status: AC

## 2015-03-08 MED ORDER — HYDROMORPHONE HCL 4 MG/ML IJ SOLN
INTRAMUSCULAR | Status: AC
  Filled 2015-03-08: qty 1

## 2015-03-08 NOTE — Patient Instructions (Addendum)
Dehydration, Adult Dehydration is a condition in which you do not have enough fluid or water in your body. It happens when you take in less fluid than you lose. Vital organs such as the kidneys, brain, and heart cannot function without a proper amount of fluids. Any loss of fluids from the body can cause dehydration.  Dehydration can range from mild to severe. This condition should be treated right away to help prevent it from becoming severe. CAUSES  This condition may be caused by:  Vomiting.  Diarrhea.  Excessive sweating, such as when exercising in hot or humid weather.  Not drinking enough fluid during strenuous exercise or during an illness.  Excessive urine output.  Fever.  Certain medicines. RISK FACTORS This condition is more likely to develop in:  People who are taking certain medicines that cause the body to lose excess fluid (diuretics).   People who have a chronic illness, such as diabetes, that may increase urination.  Older adults.   People who live at high altitudes.   People who participate in endurance sports.  SYMPTOMS  Mild Dehydration  Thirst.  Dry lips.  Slightly dry mouth.  Dry, warm skin. Moderate Dehydration  Very dry mouth.   Muscle cramps.   Dark urine and decreased urine production.   Decreased tear production.   Headache.   Light-headedness, especially when you stand up from a sitting position.  Severe Dehydration  Changes in skin.   Cold and clammy skin.   Skin does not spring back quickly when lightly pinched and released.   Changes in body fluids.   Extreme thirst.   No tears.   Not able to sweat when body temperature is high, such as in hot weather.   Minimal urine production.   Changes in vital signs.   Rapid, weak pulse (more than 100 beats per minute when you are sitting still).   Rapid breathing.   Low blood pressure.   Other changes.   Sunken eyes.   Cold hands and feet.    Confusion.  Lethargy and difficulty being awakened.  Fainting (syncope).   Short-term weight loss.   Unconsciousness. DIAGNOSIS  This condition may be diagnosed based on your symptoms. You may also have tests to determine how severe your dehydration is. These tests may include:   Urine tests.   Blood tests.  TREATMENT  Treatment for this condition depends on the severity. Mild or moderate dehydration can often be treated at home. Treatment should be started right away. Do not wait until dehydration becomes severe. Severe dehydration needs to be treated at the hospital. Treatment for Mild Dehydration  Drinking plenty of water to replace the fluid you have lost.   Replacing minerals in your blood (electrolytes) that you may have lost.  Treatment for Moderate Dehydration  Consuming oral rehydration solution (ORS). Treatment for Severe Dehydration  Receiving fluid through an IV tube.   Receiving electrolyte solution through a feeding tube that is passed through your nose and into your stomach (nasogastric tube or NG tube).  Correcting any abnormalities in electrolytes. HOME CARE INSTRUCTIONS   Drink enough fluid to keep your urine clear or pale yellow.   Drink water or fluid slowly by taking small sips. You can also try sucking on ice cubes.  Have food or beverages that contain electrolytes. Examples include bananas and sports drinks.  Take over-the-counter and prescription medicines only as told by your health care provider.   Prepare ORS according to the manufacturer's instructions. Take sips   of ORS every 5 minutes until your urine returns to normal.  If you have vomiting or diarrhea, continue to try to drink water, ORS, or both.   If you have diarrhea, avoid:   Beverages that contain caffeine.   Fruit juice.   Milk.   Carbonated soft drinks.  Do not take salt tablets. This can lead to the condition of having too much sodium in your body  (hypernatremia).  SEEK MEDICAL CARE IF:  You cannot eat or drink without vomiting.  You have had moderate diarrhea during a period of more than 24 hours.  You have a fever. SEEK IMMEDIATE MEDICAL CARE IF:   You have extreme thirst.  You have severe diarrhea.  You have not urinated in 6-8 hours, or you have urinated only a small amount of very dark urine.  You have shriveled skin.  You are dizzy, confused, or both.   This information is not intended to replace advice given to you by your health care provider. Make sure you discuss any questions you have with your health care provider.   Document Released: 04/30/2005 Document Revised: 01/19/2015 Document Reviewed: 09/15/2014 Elsevier Interactive Patient Education 2016 [place].  Hydromorphone injection What is this medicine? HYDROMORPHONE (hye droe MOR fone) is a pain reliever. It is used to treat moderate to severe pain. This medicine may be used for other purposes; ask your health care provider or pharmacist if you have questions. What should I tell my health care provider before I take this medicine? They need to know if you have any of these conditions: -brain tumor -drug abuse or addiction -head injury -heart disease -frequently drink alcohol containing drinks -kidney disease -liver disease -lung disease, asthma, or breathing problems -an allergic or unusual reaction to hydromorphone, other opioid analgesics, latex, other medicines, foods, dyes, or preservatives -pregnant or trying to get pregnant -breast-feeding How should I use this medicine? This medicine is for injection into a vein, into a muscle, or under the skin. It is usually given by a health care professional in a hospital or clinic setting. If you get this medicine at home, you will be taught how to prepare and give this medicine. Use exactly as directed. Take your medicine at regular intervals. Do not take your medicine more often than directed. It  is important that you put your used needles and syringes in a special sharps container. Do not put them in a trash can. If you do not have a sharps container, call your pharmacist or healthcare provider to get one. Talk to your pediatrician regarding the use of this medicine in children. This medicine is not approved for use in children. Overdosage: If you think you have taken too much of this medicine contact a poison control center or emergency room at once. NOTE: This medicine is only for you. Do not share this medicine with others. What if I miss a dose? If you miss a dose, use it as soon as you can. If it is almost time for your next dose, use only that dose. Do not use double or extra doses. What may interact with this medicine? -alcohol -antihistamines for allergy, cough and cold -medicines for anesthesia -medicines for depression, anxiety, or psychotic disturbances -medicines for sleep -muscle relaxants -naltrexone -narcotic medicines (opiates) for pain -phenothiazines like chlorpromazine, mesoridazine, prochlorperazine, thioridazine -tramadol This list may not describe all possible interactions. Give your health care provider a list of all the medicines, herbs, non-prescription drugs, or dietary supplements you use. Also tell  them if you smoke, drink alcohol, or use illegal drugs. Some items may interact with your medicine. What should I watch for while using this medicine? Tell your doctor or health care professional if your pain does not go away, if it gets worse, or if you have new or a different type of pain. You may develop tolerance to the medicine. Tolerance means that you will need a higher dose of the medicine for pain relief. Tolerance is normal and is expected if you take this medicine for a long time. Do not suddenly stop taking your medicine because you may develop a severe reaction. Your body becomes used to the medicine. This does NOT mean you are addicted. Addiction is a  behavior related to getting and using a drug for a non-medical reason. If you have pain, you have a medical reason to take pain medicine. Your doctor will tell you how much medicine to take. If your doctor wants you to stop the medicine, the dose will be slowly lowered over time to avoid any side effects. You may get drowsy or dizzy. Do not drive, use machinery, or do anything that needs mental alertness until you know how this medicine affects you. Do not stand or sit up quickly, especially if you are an older patient. This reduces the risk of dizzy or fainting spells. Alcohol may interfere with the effect of this medicine. Avoid alcoholic drinks. There are different types of narcotic medicines (opiates) for pain. If you take more than one type at the same time, you may have more side effects. Give your health care provider a list of all medicines you use. Your doctor will tell you how much medicine to take. Do not take more medicine than directed. Call emergency for help if you have problems breathing. This medicine will cause constipation. Try to have a bowel movement at least every 2 to 3 days. If you do not have a bowel movement for 3 days, call your doctor or health care professional. Your mouth may get dry. Chewing sugarless gum or sucking hard candy, and drinking plenty of water may help. Contact your doctor if the problem does not go away or is severe. What side effects may I notice from receiving this medicine? Side effects that you should report to your doctor or health care professional as soon as possible: -allergic reactions like skin rash, itching or hives, swelling of the face, lips, or tongue -breathing problems -changes in vision -confusion -feeling faint or lightheaded, falls -seizures -slow or fast heartbeat -trouble passing urine or change in the amount of urine -trouble with balance, talking, walking -unusually weak or tired Side effects that usually do not require medical  attention (report to your doctor or health care professional if they continue or are bothersome): -difficulty sleeping -drowsiness -dry mouth -flushing -headache -itching -loss of appetite -nausea, vomiting This list may not describe all possible side effects. Call your doctor for medical advice about side effects. You may report side effects to FDA at 1-800-FDA-1088. Where should I keep my medicine? Keep out of the reach of children. This medicine can be abused. Keep your medicine in a safe place to protect it from theft. Do not share this medicine with anyone. Selling or giving away this medicine is dangerous and against the law. If you are using this medicine at home, you will be instructed on how to store this medicine. This medicine may cause accidental overdose and death if it is taken by other adults, children, or  pets. Flush any unused medicine down the toilet to reduce the chance of harm. Do not use the medicine after the expiration date. NOTE: This sheet is a summary. It may not cover all possible information. If you have questions about this medicine, talk to your doctor, pharmacist, or health care provider.    2016, Elsevier/Gold Standard. (2012-12-02 10:47:33)  Palonosetron Injection What is this medicine? PALONOSETRON (pal oh NOE se tron) is used to prevent nausea and vomiting caused by chemotherapy. It also helps prevent delayed nausea and vomiting that may occur a few days after your treatment. This medicine may be used for other purposes; ask your health care provider or pharmacist if you have questions. What should I tell my health care provider before I take this medicine? They need to know if you have any of these conditions: -an unusual or allergic reaction to palonosetron, dolasetron, granisetron, ondansetron, other medicines, foods, dyes, or preservatives -pregnant or trying to get pregnant -breast-feeding How should I use this medicine? This medicine is for  infusion into a vein. It is given by a health care professional in a hospital or clinic setting. Talk to your pediatrician regarding the use of this medicine in children. While this drug may be prescribed for children as young as 1 month for selected conditions, precautions do apply. Overdosage: If you think you have taken too much of this medicine contact a poison control center or emergency room at once. NOTE: This medicine is only for you. Do not share this medicine with others. What if I miss a dose? This does not apply. What may interact with this medicine? -certain medicines for depression, anxiety, or psychotic disturbances -fentanyl -linezolid -MAOIs like Carbex, Eldepryl, Marplan, Nardil, and Parnate -methylene blue (injected into a vein) -tramadol This list may not describe all possible interactions. Give your health care provider a list of all the medicines, herbs, non-prescription drugs, or dietary supplements you use. Also tell them if you smoke, drink alcohol, or use illegal drugs. Some items may interact with your medicine. What should I watch for while using this medicine? Your condition will be monitored carefully while you are receiving this medicine. What side effects may I notice from receiving this medicine? Side effects that you should report to your doctor or health care professional as soon as possible: -allergic reactions like skin rash, itching or hives, swelling of the face, lips, or tongue -breathing problems -confusion -dizziness -fast, irregular heartbeat -fever and chills -loss of balance or coordination -seizures -sweating -swelling of the hands and feet -tremors -unusually weak or tired Side effects that usually do not require medical attention (report to your doctor or health care professional if they continue or are bothersome): -constipation or diarrhea -headache This list may not describe all possible side effects. Call your doctor for medical  advice about side effects. You may report side effects to FDA at 1-800-FDA-1088. Where should I keep my medicine? This drug is given in a hospital or clinic and will not be stored at home. NOTE: This sheet is a summary. It may not cover all possible information. If you have questions about this medicine, talk to your doctor, pharmacist, or health care provider.    2016, Elsevier/Gold Standard. (2013-03-06 10:38:36)   Hydromorphone injection What is this medicine? HYDROMORPHONE (hye droe MOR fone) is a pain reliever. It is used to treat moderate to severe pain. This medicine may be used for other purposes; ask your health care provider or pharmacist if you have questions. What  should I tell my health care provider before I take this medicine? They need to know if you have any of these conditions: -brain tumor -drug abuse or addiction -head injury -heart disease -frequently drink alcohol containing drinks -kidney disease -liver disease -lung disease, asthma, or breathing problems -an allergic or unusual reaction to hydromorphone, other opioid analgesics, latex, other medicines, foods, dyes, or preservatives -pregnant or trying to get pregnant -breast-feeding How should I use this medicine? This medicine is for injection into a vein, into a muscle, or under the skin. It is usually given by a health care professional in a hospital or clinic setting. If you get this medicine at home, you will be taught how to prepare and give this medicine. Use exactly as directed. Take your medicine at regular intervals. Do not take your medicine more often than directed. It is important that you put your used needles and syringes in a special sharps container. Do not put them in a trash can. If you do not have a sharps container, call your pharmacist or healthcare provider to get one. Talk to your pediatrician regarding the use of this medicine in children. This medicine is not approved for use in  children. Overdosage: If you think you have taken too much of this medicine contact a poison control center or emergency room at once. NOTE: This medicine is only for you. Do not share this medicine with others. What if I miss a dose? If you miss a dose, use it as soon as you can. If it is almost time for your next dose, use only that dose. Do not use double or extra doses. What may interact with this medicine? -alcohol -antihistamines for allergy, cough and cold -medicines for anesthesia -medicines for depression, anxiety, or psychotic disturbances -medicines for sleep -muscle relaxants -naltrexone -narcotic medicines (opiates) for pain -phenothiazines like chlorpromazine, mesoridazine, prochlorperazine, thioridazine -tramadol This list may not describe all possible interactions. Give your health care provider a list of all the medicines, herbs, non-prescription drugs, or dietary supplements you use. Also tell them if you smoke, drink alcohol, or use illegal drugs. Some items may interact with your medicine. What should I watch for while using this medicine? Tell your doctor or health care professional if your pain does not go away, if it gets worse, or if you have new or a different type of pain. You may develop tolerance to the medicine. Tolerance means that you will need a higher dose of the medicine for pain relief. Tolerance is normal and is expected if you take this medicine for a long time. Do not suddenly stop taking your medicine because you may develop a severe reaction. Your body becomes used to the medicine. This does NOT mean you are addicted. Addiction is a behavior related to getting and using a drug for a non-medical reason. If you have pain, you have a medical reason to take pain medicine. Your doctor will tell you how much medicine to take. If your doctor wants you to stop the medicine, the dose will be slowly lowered over time to avoid any side effects. You may get drowsy or  dizzy. Do not drive, use machinery, or do anything that needs mental alertness until you know how this medicine affects you. Do not stand or sit up quickly, especially if you are an older patient. This reduces the risk of dizzy or fainting spells. Alcohol may interfere with the effect of this medicine. Avoid alcoholic drinks. There are different types of narcotic  medicines (opiates) for pain. If you take more than one type at the same time, you may have more side effects. Give your health care provider a list of all medicines you use. Your doctor will tell you how much medicine to take. Do not take more medicine than directed. Call emergency for help if you have problems breathing. This medicine will cause constipation. Try to have a bowel movement at least every 2 to 3 days. If you do not have a bowel movement for 3 days, call your doctor or health care professional. Your mouth may get dry. Chewing sugarless gum or sucking hard candy, and drinking plenty of water may help. Contact your doctor if the problem does not go away or is severe. What side effects may I notice from receiving this medicine? Side effects that you should report to your doctor or health care professional as soon as possible: -allergic reactions like skin rash, itching or hives, swelling of the face, lips, or tongue -breathing problems -changes in vision -confusion -feeling faint or lightheaded, falls -seizures -slow or fast heartbeat -trouble passing urine or change in the amount of urine -trouble with balance, talking, walking -unusually weak or tired Side effects that usually do not require medical attention (report to your doctor or health care professional if they continue or are bothersome): -difficulty sleeping -drowsiness -dry mouth -flushing -headache -itching -loss of appetite -nausea, vomiting This list may not describe all possible side effects. Call your doctor for medical advice about side effects. You may  report side effects to FDA at 1-800-FDA-1088. Where should I keep my medicine? Keep out of the reach of children. This medicine can be abused. Keep your medicine in a safe place to protect it from theft. Do not share this medicine with anyone. Selling or giving away this medicine is dangerous and against the law. If you are using this medicine at home, you will be instructed on how to store this medicine. This medicine may cause accidental overdose and death if it is taken by other adults, children, or pets. Flush any unused medicine down the toilet to reduce the chance of harm. Do not use the medicine after the expiration date. NOTE: This sheet is a summary. It may not cover all possible information. If you have questions about this medicine, talk to your doctor, pharmacist, or health care provider.    2016, Elsevier/Gold Standard. (2012-12-02 10:47:33)

## 2015-03-14 ENCOUNTER — Other Ambulatory Visit: Payer: Self-pay | Admitting: *Deleted

## 2015-03-14 DIAGNOSIS — C069 Malignant neoplasm of mouth, unspecified: Secondary | ICD-10-CM

## 2015-03-15 ENCOUNTER — Ambulatory Visit (HOSPITAL_BASED_OUTPATIENT_CLINIC_OR_DEPARTMENT_OTHER): Payer: Medicaid Other

## 2015-03-15 ENCOUNTER — Other Ambulatory Visit: Payer: Self-pay | Admitting: *Deleted

## 2015-03-15 ENCOUNTER — Other Ambulatory Visit: Payer: Self-pay | Admitting: Family

## 2015-03-15 ENCOUNTER — Other Ambulatory Visit (HOSPITAL_BASED_OUTPATIENT_CLINIC_OR_DEPARTMENT_OTHER): Payer: Medicaid Other

## 2015-03-15 VITALS — BP 103/68 | HR 82 | Temp 97.6°F | Resp 18

## 2015-03-15 DIAGNOSIS — C760 Malignant neoplasm of head, face and neck: Secondary | ICD-10-CM | POA: Diagnosis not present

## 2015-03-15 DIAGNOSIS — K909 Intestinal malabsorption, unspecified: Secondary | ICD-10-CM

## 2015-03-15 DIAGNOSIS — C770 Secondary and unspecified malignant neoplasm of lymph nodes of head, face and neck: Secondary | ICD-10-CM

## 2015-03-15 DIAGNOSIS — Z5112 Encounter for antineoplastic immunotherapy: Secondary | ICD-10-CM | POA: Diagnosis present

## 2015-03-15 DIAGNOSIS — D509 Iron deficiency anemia, unspecified: Secondary | ICD-10-CM

## 2015-03-15 DIAGNOSIS — C069 Malignant neoplasm of mouth, unspecified: Secondary | ICD-10-CM | POA: Diagnosis present

## 2015-03-15 DIAGNOSIS — M899 Disorder of bone, unspecified: Secondary | ICD-10-CM | POA: Diagnosis not present

## 2015-03-15 DIAGNOSIS — C029 Malignant neoplasm of tongue, unspecified: Secondary | ICD-10-CM

## 2015-03-15 LAB — CBC WITH DIFFERENTIAL (CANCER CENTER ONLY)
BASO#: 0 10*3/uL (ref 0.0–0.2)
BASO%: 0.2 % (ref 0.0–2.0)
EOS%: 0.2 % (ref 0.0–7.0)
Eosinophils Absolute: 0 10*3/uL (ref 0.0–0.5)
HCT: 27.4 % — ABNORMAL LOW (ref 38.7–49.9)
HGB: 8.8 g/dL — ABNORMAL LOW (ref 13.0–17.1)
LYMPH#: 0.4 10*3/uL — ABNORMAL LOW (ref 0.9–3.3)
LYMPH%: 8.3 % — AB (ref 14.0–48.0)
MCH: 31.7 pg (ref 28.0–33.4)
MCHC: 32.1 g/dL (ref 32.0–35.9)
MCV: 99 fL — ABNORMAL HIGH (ref 82–98)
MONO#: 1 10*3/uL — AB (ref 0.1–0.9)
MONO%: 22 % — ABNORMAL HIGH (ref 0.0–13.0)
NEUT%: 69.3 % (ref 40.0–80.0)
NEUTROS ABS: 3.2 10*3/uL (ref 1.5–6.5)
PLATELETS: 239 10*3/uL (ref 145–400)
RBC: 2.78 10*6/uL — AB (ref 4.20–5.70)
RDW: 14.4 % (ref 11.1–15.7)
WBC: 4.6 10*3/uL (ref 4.0–10.0)

## 2015-03-15 LAB — CMP (CANCER CENTER ONLY)
ALK PHOS: 62 U/L (ref 26–84)
ALT: 17 U/L (ref 10–47)
AST: 22 U/L (ref 11–38)
Albumin: 3.1 g/dL — ABNORMAL LOW (ref 3.3–5.5)
BUN: 16 mg/dL (ref 7–22)
CO2: 28 mEq/L (ref 18–33)
CREATININE: 0.6 mg/dL (ref 0.6–1.2)
Calcium: 9.4 mg/dL (ref 8.0–10.3)
Chloride: 98 mEq/L (ref 98–108)
Glucose, Bld: 81 mg/dL (ref 73–118)
Potassium: 3.7 mEq/L (ref 3.3–4.7)
SODIUM: 139 meq/L (ref 128–145)
TOTAL PROTEIN: 7.2 g/dL (ref 6.4–8.1)
Total Bilirubin: 0.4 mg/dl (ref 0.20–1.60)

## 2015-03-15 MED ORDER — CETUXIMAB CHEMO IV INJECTION 200 MG/100ML
250.0000 mg/m2 | Freq: Once | INTRAVENOUS | Status: AC
  Administered 2015-03-15: 400 mg via INTRAVENOUS
  Filled 2015-03-15: qty 200

## 2015-03-15 MED ORDER — PROMETHAZINE HCL 25 MG PO TABS: 25.0000 mg | ORAL_TABLET | Freq: Four times a day (QID) | ORAL | Status: AC | PRN

## 2015-03-15 MED ORDER — DIPHENHYDRAMINE HCL 50 MG/ML IJ SOLN
50.0000 mg | Freq: Once | INTRAMUSCULAR | Status: AC
  Administered 2015-03-15: 50 mg via INTRAVENOUS

## 2015-03-15 MED ORDER — SODIUM CHLORIDE 0.9 % IV SOLN
Freq: Once | INTRAVENOUS | Status: AC
  Administered 2015-03-15: 11:00:00 via INTRAVENOUS
  Filled 2015-03-15: qty 5

## 2015-03-15 MED ORDER — PALONOSETRON HCL INJECTION 0.25 MG/5ML
0.2500 mg | Freq: Once | INTRAVENOUS | Status: AC
  Administered 2015-03-15: 0.25 mg via INTRAVENOUS

## 2015-03-15 MED ORDER — PALONOSETRON HCL INJECTION 0.25 MG/5ML
INTRAVENOUS | Status: AC
  Filled 2015-03-15: qty 5

## 2015-03-15 MED ORDER — SODIUM CHLORIDE 0.9 % IV SOLN
Freq: Once | INTRAVENOUS | Status: AC
  Administered 2015-03-15: 11:00:00 via INTRAVENOUS

## 2015-03-15 MED ORDER — DIPHENHYDRAMINE HCL 50 MG/ML IJ SOLN
INTRAMUSCULAR | Status: AC
  Filled 2015-03-15: qty 1

## 2015-03-15 MED ORDER — OXYCODONE HCL 30 MG PO TABS: 30.0000 mg | ORAL_TABLET | ORAL | Status: DC | PRN

## 2015-03-15 MED ORDER — HYDROMORPHONE HCL 4 MG/ML IJ SOLN
4.0000 mg | Freq: Once | INTRAMUSCULAR | Status: DC
  Administered 2015-03-15: 4 mg via INTRAVENOUS

## 2015-03-15 MED ORDER — HYDROMORPHONE HCL 4 MG/ML IJ SOLN: 4.0000 mg | Freq: Once | INTRAMUSCULAR | Status: DC

## 2015-03-15 MED ORDER — DENOSUMAB 120 MG/1.7ML ~~LOC~~ SOLN
120.0000 mg | Freq: Once | SUBCUTANEOUS | Status: AC
  Administered 2015-03-15: 120 mg via SUBCUTANEOUS
  Filled 2015-03-15: qty 1.7

## 2015-03-15 MED ORDER — HYDROMORPHONE HCL 4 MG/ML IJ SOLN
INTRAMUSCULAR | Status: AC
  Filled 2015-03-15: qty 1

## 2015-03-15 NOTE — Patient Instructions (Signed)
[facility] Discharge Instructions for Patients Receiving Chemotherapy  Today you received the following chemotherapy agents Erbitux.  To help prevent nausea and vomiting after your treatment, we encourage you to take your nausea medication.   If you develop nausea and vomiting that is not controlled by your nausea medication, call the clinic.   BELOW ARE SYMPTOMS THAT SHOULD BE REPORTED IMMEDIATELY:  *FEVER GREATER THAN 100.5 F  *CHILLS WITH OR WITHOUT FEVER  NAUSEA AND VOMITING THAT IS NOT CONTROLLED WITH YOUR NAUSEA MEDICATION  *UNUSUAL SHORTNESS OF BREATH  *UNUSUAL BRUISING OR BLEEDING  TENDERNESS IN MOUTH AND THROAT WITH OR WITHOUT PRESENCE OF ULCERS  *URINARY PROBLEMS  *BOWEL PROBLEMS  UNUSUAL RASH Items with * indicate a potential emergency and should be followed up as soon as possible.  Feel free to call the clinic you have any questions or concerns. The clinic phone number is [phone].  Please show the CHEMO ALERT CARD at check-in to the Emergency Department and triage nurse.   

## 2015-03-16 LAB — PREALBUMIN: Prealbumin: 15 mg/dL — ABNORMAL LOW (ref 21–43)

## 2015-03-16 LAB — MAGNESIUM: MAGNESIUM: 1.3 mg/dL — AB (ref 1.5–2.5)

## 2015-03-22 ENCOUNTER — Other Ambulatory Visit (HOSPITAL_BASED_OUTPATIENT_CLINIC_OR_DEPARTMENT_OTHER): Payer: Medicaid Other

## 2015-03-22 ENCOUNTER — Ambulatory Visit (HOSPITAL_BASED_OUTPATIENT_CLINIC_OR_DEPARTMENT_OTHER): Payer: Medicaid Other

## 2015-03-22 ENCOUNTER — Other Ambulatory Visit: Payer: Self-pay | Admitting: Family

## 2015-03-22 ENCOUNTER — Ambulatory Visit: Payer: Self-pay

## 2015-03-22 ENCOUNTER — Ambulatory Visit (HOSPITAL_BASED_OUTPATIENT_CLINIC_OR_DEPARTMENT_OTHER): Payer: Medicaid Other | Admitting: Family

## 2015-03-22 ENCOUNTER — Encounter: Payer: Self-pay | Admitting: Family

## 2015-03-22 ENCOUNTER — Other Ambulatory Visit: Payer: Self-pay

## 2015-03-22 ENCOUNTER — Ambulatory Visit: Payer: Self-pay | Admitting: Hematology & Oncology

## 2015-03-22 VITALS — BP 102/68 | HR 86 | Temp 98.1°F | Resp 16 | Ht 70.0 in | Wt 118.0 lb

## 2015-03-22 DIAGNOSIS — C760 Malignant neoplasm of head, face and neck: Secondary | ICD-10-CM | POA: Diagnosis present

## 2015-03-22 DIAGNOSIS — C069 Malignant neoplasm of mouth, unspecified: Secondary | ICD-10-CM

## 2015-03-22 DIAGNOSIS — D509 Iron deficiency anemia, unspecified: Secondary | ICD-10-CM

## 2015-03-22 DIAGNOSIS — C029 Malignant neoplasm of tongue, unspecified: Secondary | ICD-10-CM

## 2015-03-22 DIAGNOSIS — K9089 Other intestinal malabsorption: Secondary | ICD-10-CM

## 2015-03-22 DIAGNOSIS — Z5111 Encounter for antineoplastic chemotherapy: Secondary | ICD-10-CM

## 2015-03-22 DIAGNOSIS — Z5112 Encounter for antineoplastic immunotherapy: Secondary | ICD-10-CM

## 2015-03-22 LAB — CBC WITH DIFFERENTIAL (CANCER CENTER ONLY)
BASO#: 0 10*3/uL (ref 0.0–0.2)
BASO%: 0.3 % (ref 0.0–2.0)
EOS%: 0 % (ref 0.0–7.0)
Eosinophils Absolute: 0 10*3/uL (ref 0.0–0.5)
HCT: 29.9 % — ABNORMAL LOW (ref 38.7–49.9)
HGB: 9.7 g/dL — ABNORMAL LOW (ref 13.0–17.1)
LYMPH#: 0.3 10*3/uL — AB (ref 0.9–3.3)
LYMPH%: 4.6 % — AB (ref 14.0–48.0)
MCH: 31.9 pg (ref 28.0–33.4)
MCHC: 32.4 g/dL (ref 32.0–35.9)
MCV: 98 fL (ref 82–98)
MONO#: 0.6 10*3/uL (ref 0.1–0.9)
MONO%: 8 % (ref 0.0–13.0)
NEUT#: 6.1 10*3/uL (ref 1.5–6.5)
NEUT%: 87.1 % — ABNORMAL HIGH (ref 40.0–80.0)
PLATELETS: 185 10*3/uL (ref 145–400)
RBC: 3.04 10*6/uL — AB (ref 4.20–5.70)
RDW: 15.5 % (ref 11.1–15.7)
WBC: 7 10*3/uL (ref 4.0–10.0)

## 2015-03-22 LAB — CMP (CANCER CENTER ONLY)
ALBUMIN: 3.1 g/dL — AB (ref 3.3–5.5)
ALT(SGPT): 16 U/L (ref 10–47)
AST: 21 U/L (ref 11–38)
Alkaline Phosphatase: 60 U/L (ref 26–84)
BILIRUBIN TOTAL: 0.4 mg/dL (ref 0.20–1.60)
BUN: 14 mg/dL (ref 7–22)
CHLORIDE: 102 meq/L (ref 98–108)
CO2: 29 meq/L (ref 18–33)
CREATININE: 0.6 mg/dL (ref 0.6–1.2)
Calcium: 8.7 mg/dL (ref 8.0–10.3)
Glucose, Bld: 90 mg/dL (ref 73–118)
Potassium: 3.9 mEq/L (ref 3.3–4.7)
SODIUM: 137 meq/L (ref 128–145)
TOTAL PROTEIN: 7.2 g/dL (ref 6.4–8.1)

## 2015-03-22 MED ORDER — PALONOSETRON HCL INJECTION 0.25 MG/5ML
0.2500 mg | Freq: Once | INTRAVENOUS | Status: AC
  Administered 2015-03-22: 0.25 mg via INTRAVENOUS

## 2015-03-22 MED ORDER — SODIUM CHLORIDE 0.9 % IV SOLN
Freq: Once | INTRAVENOUS | Status: AC
  Administered 2015-03-22: 11:00:00 via INTRAVENOUS
  Filled 2015-03-22: qty 5

## 2015-03-22 MED ORDER — DIPHENHYDRAMINE HCL 50 MG/ML IJ SOLN
50.0000 mg | Freq: Once | INTRAMUSCULAR | Status: AC
  Administered 2015-03-22: 50 mg via INTRAVENOUS

## 2015-03-22 MED ORDER — DIPHENHYDRAMINE HCL 50 MG/ML IJ SOLN
INTRAMUSCULAR | Status: AC
  Filled 2015-03-22: qty 1

## 2015-03-22 MED ORDER — DOCETAXEL CHEMO INJECTION 160 MG/16ML
65.0000 mg/m2 | Freq: Once | INTRAVENOUS | Status: AC
  Administered 2015-03-22: 110 mg via INTRAVENOUS
  Filled 2015-03-22: qty 11

## 2015-03-22 MED ORDER — SODIUM CHLORIDE 0.9 % IV SOLN
Freq: Once | INTRAVENOUS | Status: AC
  Administered 2015-03-22: 10:00:00 via INTRAVENOUS

## 2015-03-22 MED ORDER — OXYMORPHONE HCL 10 MG PO TABS: 10.0000 mg | ORAL_TABLET | ORAL | Status: DC | PRN

## 2015-03-22 MED ORDER — CARBOPLATIN CHEMO INTRADERMAL TEST DOSE 100MCG/0.02ML
100.0000 ug | Freq: Once | INTRADERMAL | Status: AC
  Administered 2015-03-22: 100 ug via INTRADERMAL
  Filled 2015-03-22: qty 0.02

## 2015-03-22 MED ORDER — LIDOCAINE VISCOUS 2 % MT SOLN: 20.0000 mL | OROMUCOSAL | Status: DC | PRN

## 2015-03-22 MED ORDER — HYDROMORPHONE HCL 1 MG/ML IJ SOLN
INTRAMUSCULAR | Status: AC
  Filled 2015-03-22: qty 2

## 2015-03-22 MED ORDER — FENTANYL 75 MCG/HR TD PT72: 150.0000 ug | MEDICATED_PATCH | TRANSDERMAL | Status: DC

## 2015-03-22 MED ORDER — CETUXIMAB CHEMO IV INJECTION 200 MG/100ML
250.0000 mg/m2 | Freq: Once | INTRAVENOUS | Status: AC
  Administered 2015-03-22: 400 mg via INTRAVENOUS
  Filled 2015-03-22: qty 200

## 2015-03-22 MED ORDER — HYDROMORPHONE HCL 4 MG/ML IJ SOLN
2.0000 mg | Freq: Once | INTRAMUSCULAR | Status: AC
  Administered 2015-03-22: 2 mg via INTRAVENOUS

## 2015-03-22 MED ORDER — SODIUM CHLORIDE 0.9 % IV SOLN
550.0000 mg | Freq: Once | INTRAVENOUS | Status: AC
  Administered 2015-03-22: 550 mg via INTRAVENOUS
  Filled 2015-03-22: qty 55

## 2015-03-22 MED ORDER — PALONOSETRON HCL INJECTION 0.25 MG/5ML
INTRAVENOUS | Status: AC
  Filled 2015-03-22: qty 5

## 2015-03-22 NOTE — Patient Instructions (Signed)
[facility] Discharge Instructions for Patients Receiving Chemotherapy  Today you received the following chemotherapy agents Taxotere, Carboplatin and Erbitux.  To help prevent nausea and vomiting after your treatment, we encourage you to take your nausea medication.   If you develop nausea and vomiting that is not controlled by your nausea medication, call the clinic.   BELOW ARE SYMPTOMS THAT SHOULD BE REPORTED IMMEDIATELY:  *FEVER GREATER THAN 100.5 F  *CHILLS WITH OR WITHOUT FEVER  NAUSEA AND VOMITING THAT IS NOT CONTROLLED WITH YOUR NAUSEA MEDICATION  *UNUSUAL SHORTNESS OF BREATH  *UNUSUAL BRUISING OR BLEEDING  TENDERNESS IN MOUTH AND THROAT WITH OR WITHOUT PRESENCE OF ULCERS  *URINARY PROBLEMS  *BOWEL PROBLEMS  UNUSUAL RASH Items with * indicate a potential emergency and should be followed up as soon as possible.  Feel free to call the clinic you have any questions or concerns. The clinic phone number is [phone].  Please show the [place] at check-in to the Emergency Department and triage nurse.

## 2015-03-22 NOTE — Progress Notes (Signed)
Hematology and Oncology Follow Up Visit  Derek Campos [id number] [birth year] 44 y.o. 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 He is having pain from a tooth that needs to come out. He was scheduled to have it pulled but his dentist office needs more information on what type of radiation he received while in [place] and is contacting the cancer center there. He is awaiting a call back. In the mean time, we will adjust his pain medication to give him some relief.  He still has some submandibular lymphadenopathy. No other lymphadenopathy noted on exam.  No fever, chills, n/v, cough, rash, dizziness, SOB, chest pain, palpitations, abdominal pain or changes in bowel or bladder habits.  He does well with his daily feedings and is making sure to stay well hydrated. He still has trouble swallowing and is using his J-tube for all his intake. His weight is stable.  He has had no swelling or tenderness in his extremities. He denies having any "boney" pain. He has some mild numbness and tingling in his fingertips at this time. He states that this is tolerable. His fingernails are discolored and some of them are loose. i explained to him that this is a side effect of the Taxotere. He is ok with this and denies pain.   Medications:    Medication List       This list is accurate as of: 03/22/15  9:12 AM.  Always use your most recent med list.               dexamethasone 4 MG tablet  Commonly known as:  DECADRON  Take 2 tablets (8 mg total) by mouth 2 (two) times daily with a meal.     fentaNYL 75 MCG/HR  Commonly known as:  DURAGESIC - dosed mcg/hr  Place 2 patches (150 mcg total) onto the skin every 3 (three) days.     gabapentin 600 MG tablet  Commonly known as:  NEURONTIN  Take 1 tablet (600 mg total) by mouth 3 (three) times daily.     lidocaine 2 % solution  Commonly known as:  XYLOCAINE  Use as directed 20 mLs in the mouth or throat every 3 (three) hours as needed for mouth pain.     LORazepam 0.5 MG tablet  Commonly known as:  ATIVAN  Take 1 tablet (0.5 mg total) by mouth every 8 (eight) hours.     magic mouthwash w/lidocaine Soln  Take 15 mLs by mouth 4 (four) times daily as needed for mouth pain.     oxycodone 30 MG immediate release tablet  Commonly known as:  ROXICODONE  Take 1 tablet (30 mg total) by mouth every 4 (four) hours as needed for pain.     Potassium Chloride 40 MEQ/15ML (20%) Soln  Take 15 mLs by mouth 2 (two) times daily.     promethazine 25 MG tablet  Commonly known as:  PHENERGAN  Take 1 tablet (25 mg total) by mouth every 6 (six) hours as needed for nausea or vomiting.     ranitidine 150 MG tablet  Commonly known as:  ZANTAC  Take 1 tablet (150 mg total) by mouth 2 (two) times daily. Crush and put down feeding tube.  Allergies:  Allergies  Allergen Reactions  . Other     Cat Gut Stitches - unknown reaction.Told by parents.    Past Medical History, Surgical history, Social history, and Family History were reviewed and updated.  Review of Systems: All other 10 point review of systems is negative.   Physical Exam:  height is 5\' 10"  (1.778 m) and weight is 118 lb (53.524 kg). His oral temperature is 98.1 F (36.7 C). His blood pressure is 102/68 and his pulse is 86. His respiration is 16.   Wt Readings from Last 3 Encounters:  03/22/15 118 lb (53.524 kg)  03/01/15 120 lb (54.432 kg)  02/15/15 112 lb (50.803 kg)    Ocular: Sclerae unicteric, pupils equal, round and reactive to light Ear-nose-throat: Oropharynx clear, dentition fair Lymphatic: No cervical or supraclavicular adenopathy Lungs no rales or rhonchi, good excursion bilaterally Heart  regular rate and rhythm, no murmur appreciated Abd soft, nontender, positive bowel sounds MSK no focal spinal tenderness, no joint edema Neuro: non-focal, well-oriented, appropriate affect  Lab Results  Component Value Date   WBC 7.0 03/22/2015   HGB 9.7* 03/22/2015   HCT 29.9* 03/22/2015   MCV 98 03/22/2015   PLT 185 03/22/2015   Lab Results  Component Value Date   FERRITIN 394* 12/20/2014   IRON 42 12/20/2014   TIBC 261 12/20/2014   UIBC 219 12/20/2014   IRONPCTSAT 16* 12/20/2014   Lab Results  Component Value Date   RETICCTPCT 1.1 11/02/2014   RBC 3.04* 03/22/2015   No results found for: KPAFRELGTCHN, LAMBDASER, KAPLAMBRATIO No results found for: Kandis Cocking, IGMSERUM No results found for: Odetta Pink, SPEI   Chemistry      Component Value Date/Time   NA 139 03/15/2015 0946   NA 139 12/03/2014 0629   NA 137 10/25/2014 1018   K 3.7 03/15/2015 0946   K 3.7 12/03/2014 0629   K 4.1 10/25/2014 1018   CL 98 03/15/2015 0946   CL 103 12/03/2014 0629   CO2 28 03/15/2015 0946   CO2 26 12/03/2014 0629   CO2 26 10/25/2014 1018   BUN 16 03/15/2015 0946   BUN 18 12/03/2014 0629   BUN 15.3 10/25/2014 1018   CREATININE 0.6 03/15/2015 0946   CREATININE 0.83 12/03/2014 0629   CREATININE 0.7 10/25/2014 1018      Component Value Date/Time   CALCIUM 9.4 03/15/2015 0946   CALCIUM 8.6* 12/03/2014 0629   CALCIUM 9.3 10/25/2014 1018   ALKPHOS 62 03/15/2015 0946   ALKPHOS 110 12/02/2014 1504   ALKPHOS 74 10/25/2014 1018   AST 22 03/15/2015 0946   AST 29 12/02/2014 1504   AST 16 10/25/2014 1018   ALT 17 03/15/2015 0946   ALT 18 12/02/2014 1504   ALT 12 10/25/2014 1018   BILITOT 0.40 03/15/2015 0946   BILITOT 0.9 12/02/2014 1504   BILITOT 0.20 10/25/2014 1018     Impression and Plan: Derek Campos is a 44 year old white gentleman with metastatic head and neck cancer. He is having worsening pain in his mouth due to an  abscessed tooth. He is waiting to here back from his dentist office for a date and time to have it removed.  For pain control we will take him off the Roxicodone and try him on Opana.  We will also increase his Duragesic patch to 150 mcg every 2 days.  We will proceed with cycle 9 of treatment today as planned.  We will get him  scheduled for repeat scan in 1 month and plan to see him that same day for follow-up and labs.  He knows to contact us with any questions or concerns. We can certainly bring him in sooner if need be.   Eliezer Bottom, NP 11/8/20169:12 AM

## 2015-03-23 LAB — PREALBUMIN: PREALBUMIN: 19 mg/dL — AB (ref 21–43)

## 2015-03-29 ENCOUNTER — Ambulatory Visit (HOSPITAL_BASED_OUTPATIENT_CLINIC_OR_DEPARTMENT_OTHER): Payer: Medicaid Other

## 2015-03-29 ENCOUNTER — Other Ambulatory Visit (HOSPITAL_BASED_OUTPATIENT_CLINIC_OR_DEPARTMENT_OTHER): Payer: Medicaid Other

## 2015-03-29 VITALS — BP 103/68 | HR 84 | Temp 98.2°F | Resp 18

## 2015-03-29 DIAGNOSIS — C069 Malignant neoplasm of mouth, unspecified: Secondary | ICD-10-CM

## 2015-03-29 DIAGNOSIS — Z5112 Encounter for antineoplastic immunotherapy: Secondary | ICD-10-CM

## 2015-03-29 DIAGNOSIS — C760 Malignant neoplasm of head, face and neck: Secondary | ICD-10-CM

## 2015-03-29 DIAGNOSIS — C029 Malignant neoplasm of tongue, unspecified: Secondary | ICD-10-CM

## 2015-03-29 LAB — CBC WITH DIFFERENTIAL (CANCER CENTER ONLY)
BASO#: 0 10*3/uL (ref 0.0–0.2)
BASO%: 0.4 % (ref 0.0–2.0)
EOS ABS: 0 10*3/uL (ref 0.0–0.5)
EOS%: 0 % (ref 0.0–7.0)
HEMATOCRIT: 28.7 % — AB (ref 38.7–49.9)
HEMOGLOBIN: 9.5 g/dL — AB (ref 13.0–17.1)
LYMPH#: 0.3 10*3/uL — AB (ref 0.9–3.3)
LYMPH%: 11 % — ABNORMAL LOW (ref 14.0–48.0)
MCH: 31.9 pg (ref 28.0–33.4)
MCHC: 33.1 g/dL (ref 32.0–35.9)
MCV: 96 fL (ref 82–98)
MONO#: 0.2 10*3/uL (ref 0.1–0.9)
MONO%: 5.7 % (ref 0.0–13.0)
NEUT%: 82.9 % — ABNORMAL HIGH (ref 40.0–80.0)
NEUTROS ABS: 2.2 10*3/uL (ref 1.5–6.5)
Platelets: 116 10*3/uL — ABNORMAL LOW (ref 145–400)
RBC: 2.98 10*6/uL — AB (ref 4.20–5.70)
RDW: 15.1 % (ref 11.1–15.7)
WBC: 2.6 10*3/uL — AB (ref 4.0–10.0)

## 2015-03-29 LAB — MAGNESIUM (CC13): MAGNESIUM: 1.4 mg/dL — AB (ref 1.5–2.5)

## 2015-03-29 LAB — CMP (CANCER CENTER ONLY)
ALBUMIN: 3.1 g/dL — AB (ref 3.3–5.5)
ALT(SGPT): 13 U/L (ref 10–47)
AST: 21 U/L (ref 11–38)
Alkaline Phosphatase: 67 U/L (ref 26–84)
BILIRUBIN TOTAL: 0.5 mg/dL (ref 0.20–1.60)
BUN, Bld: 17 mg/dL (ref 7–22)
CALCIUM: 9.2 mg/dL (ref 8.0–10.3)
CHLORIDE: 100 meq/L (ref 98–108)
CO2: 27 meq/L (ref 18–33)
CREATININE: 0.7 mg/dL (ref 0.6–1.2)
Glucose, Bld: 56 mg/dL — ABNORMAL LOW (ref 73–118)
Potassium: 3.8 mEq/L (ref 3.3–4.7)
Sodium: 140 mEq/L (ref 128–145)
TOTAL PROTEIN: 7.2 g/dL (ref 6.4–8.1)

## 2015-03-29 MED ORDER — DIPHENHYDRAMINE HCL 50 MG/ML IJ SOLN
50.0000 mg | Freq: Once | INTRAMUSCULAR | Status: AC
  Administered 2015-03-29: 50 mg via INTRAVENOUS

## 2015-03-29 MED ORDER — PALONOSETRON HCL INJECTION 0.25 MG/5ML
INTRAVENOUS | Status: AC
  Filled 2015-03-29: qty 5

## 2015-03-29 MED ORDER — DIPHENHYDRAMINE HCL 50 MG/ML IJ SOLN
INTRAMUSCULAR | Status: AC
  Filled 2015-03-29: qty 1

## 2015-03-29 MED ORDER — SODIUM CHLORIDE 0.9 % IV SOLN
Freq: Once | INTRAVENOUS | Status: AC
  Administered 2015-03-29: 11:00:00 via INTRAVENOUS
  Filled 2015-03-29: qty 5

## 2015-03-29 MED ORDER — PALONOSETRON HCL INJECTION 0.25 MG/5ML
0.2500 mg | Freq: Once | INTRAVENOUS | Status: AC
  Administered 2015-03-29: 0.25 mg via INTRAVENOUS

## 2015-03-29 MED ORDER — SODIUM CHLORIDE 0.9 % IV SOLN
Freq: Once | INTRAVENOUS | Status: AC
Start: 2015-03-29 — End: 2015-03-29
  Administered 2015-03-29: 09:00:00 via INTRAVENOUS

## 2015-03-29 MED ORDER — CETUXIMAB CHEMO IV INJECTION 200 MG/100ML
250.0000 mg/m2 | Freq: Once | INTRAVENOUS | Status: AC
  Administered 2015-03-29: 400 mg via INTRAVENOUS
  Filled 2015-03-29: qty 200

## 2015-03-29 NOTE — Patient Instructions (Signed)
Cetuximab injection What is this medicine? CETUXIMAB (se TUX i mab) is a monoclonal antibody. It is used to treat colorectal cancer and head and neck cancer. This medicine may be used for other purposes; ask your health care provider or pharmacist if you have questions. What should I tell my health care provider before I take this medicine? They need to know if you have any of these conditions: -heart disease -history of irregular heartbeat -history of low levels of calcium, magnesium, or potassium in the blood -lung or breathing disease, like asthma -an unusual or allergic reaction to cetuximab, other medicines, foods, dyes, or preservatives -pregnant or trying to get pregnant -breast-feeding How should I use this medicine? This drug is given as an infusion into a vein. It is administered in a hospital or clinic by a specially trained health care professional. Talk to your pediatrician regarding the use of this medicine in children. Special care may be needed. Overdosage: If you think you have taken too much of this medicine contact a poison control center or emergency room at once. NOTE: This medicine is only for you. Do not share this medicine with others. What if I miss a dose? It is important not to miss your dose. Call your doctor or health care professional if you are unable to keep an appointment. What may interact with this medicine? Interactions are not expected. This list may not describe all possible interactions. Give your health care provider a list of all the medicines, herbs, non-prescription drugs, or dietary supplements you use. Also tell them if you smoke, drink alcohol, or use illegal drugs. Some items may interact with your medicine. What should I watch for while using this medicine? Visit your doctor or health care professional for regular checks on your progress. This drug may make you feel generally unwell. This is not uncommon, as chemotherapy can affect healthy cells  as well as cancer cells. Report any side effects. Continue your course of treatment even though you feel ill unless your doctor tells you to stop. This medicine can make you more sensitive to the sun. Keep out of the sun while taking this medicine and for 2 months after the last dose. If you cannot avoid being in the sun, wear protective clothing and use sunscreen. Do not use sun lamps or tanning beds/booths. You may need blood work done while you are taking this medicine. In some cases, you may be given additional medicines to help with side effects. Follow all directions for their use. Call your doctor or health care professional for advice if you get a fever, chills or sore throat, or other symptoms of a cold or flu. Do not treat yourself. This drug decreases your body's ability to fight infections. Try to avoid being around people who are sick. Avoid taking products that contain aspirin, acetaminophen, ibuprofen, naproxen, or ketoprofen unless instructed by your doctor. These medicines may hide a fever. Do not become pregnant while taking this medicine. Women should inform their doctor if they wish to become pregnant or think they might be pregnant. There is a potential for serious side effects to an unborn child. Use adequate birth control methods. Avoid pregnancy for at least 6 months after your last dose. Talk to your health care professional or pharmacist for more information. Do not breast-feed an infant while taking this medicine or during the 2 months after your last dose. What side effects may I notice from receiving this medicine? Side effects that you should report to   your doctor or health care professional as soon as possible: -allergic reactions like skin rash, itching or hives, swelling of the face, lips, or tongue -breathing problems -changes in vision -fast, irregular heartbeat -feeling faint or lightheaded, falls -fever, chills -mouth sores -redness, blistering, peeling or  loosening of the skin, including inside the mouth -trouble passing urine or change in the amount of urine -unusually weak or tired Side effects that usually do not require medical attention (report to your doctor or health care professional if they continue or are bothersome): -changes in skin like acne, cracks, skin dryness -constipation -diarrhea -headache -nail changes -nausea, vomiting -stomach upset -weight loss This list may not describe all possible side effects. Call your doctor for medical advice about side effects. You may report side effects to FDA at 1-800-FDA-1088. Where should I keep my medicine? This drug is given in a hospital or clinic and will not be stored at home. NOTE: This sheet is a summary. It may not cover all possible information. If you have questions about this medicine, talk to your doctor, pharmacist, or health care provider.    2016, [employer]vier/Gold Standard. (2014-07-07 22:27:08)  

## 2015-03-30 LAB — PREALBUMIN: PREALBUMIN: 21 mg/dL (ref 21–43)

## 2015-04-04 ENCOUNTER — Other Ambulatory Visit: Payer: Self-pay | Admitting: *Deleted

## 2015-04-04 DIAGNOSIS — C029 Malignant neoplasm of tongue, unspecified: Secondary | ICD-10-CM

## 2015-04-05 ENCOUNTER — Ambulatory Visit (HOSPITAL_BASED_OUTPATIENT_CLINIC_OR_DEPARTMENT_OTHER): Payer: Medicaid Other

## 2015-04-05 ENCOUNTER — Other Ambulatory Visit (HOSPITAL_BASED_OUTPATIENT_CLINIC_OR_DEPARTMENT_OTHER): Payer: Medicaid Other

## 2015-04-05 ENCOUNTER — Other Ambulatory Visit: Payer: Self-pay | Admitting: Family

## 2015-04-05 VITALS — BP 85/52 | HR 67 | Temp 97.9°F | Resp 18

## 2015-04-05 DIAGNOSIS — Z5112 Encounter for antineoplastic immunotherapy: Secondary | ICD-10-CM | POA: Diagnosis present

## 2015-04-05 DIAGNOSIS — C760 Malignant neoplasm of head, face and neck: Secondary | ICD-10-CM

## 2015-04-05 DIAGNOSIS — C029 Malignant neoplasm of tongue, unspecified: Secondary | ICD-10-CM

## 2015-04-05 DIAGNOSIS — C069 Malignant neoplasm of mouth, unspecified: Secondary | ICD-10-CM

## 2015-04-05 DIAGNOSIS — C770 Secondary and unspecified malignant neoplasm of lymph nodes of head, face and neck: Secondary | ICD-10-CM

## 2015-04-05 LAB — CMP (CANCER CENTER ONLY)
ALBUMIN: 3.1 g/dL — AB (ref 3.3–5.5)
ALT(SGPT): 11 U/L (ref 10–47)
AST: 17 U/L (ref 11–38)
Alkaline Phosphatase: 66 U/L (ref 26–84)
BUN, Bld: 18 mg/dL (ref 7–22)
CHLORIDE: 96 meq/L — AB (ref 98–108)
CO2: 33 meq/L (ref 18–33)
Calcium: 9.2 mg/dL (ref 8.0–10.3)
Creat: 0.5 mg/dl — ABNORMAL LOW (ref 0.6–1.2)
Glucose, Bld: 80 mg/dL (ref 73–118)
POTASSIUM: 4.1 meq/L (ref 3.3–4.7)
Sodium: 140 mEq/L (ref 128–145)
TOTAL PROTEIN: 7.2 g/dL (ref 6.4–8.1)
Total Bilirubin: 0.6 mg/dl (ref 0.20–1.60)

## 2015-04-05 LAB — CBC WITH DIFFERENTIAL (CANCER CENTER ONLY)
BASO#: 0 10*3/uL (ref 0.0–0.2)
BASO%: 0.4 % (ref 0.0–2.0)
EOS%: 0.7 % (ref 0.0–7.0)
Eosinophils Absolute: 0 10*3/uL (ref 0.0–0.5)
HEMATOCRIT: 28.8 % — AB (ref 38.7–49.9)
HEMOGLOBIN: 9.5 g/dL — AB (ref 13.0–17.1)
LYMPH#: 0.4 10*3/uL — AB (ref 0.9–3.3)
LYMPH%: 12.9 % — ABNORMAL LOW (ref 14.0–48.0)
MCH: 31.7 pg (ref 28.0–33.4)
MCHC: 33 g/dL (ref 32.0–35.9)
MCV: 96 fL (ref 82–98)
MONO#: 1 10*3/uL — ABNORMAL HIGH (ref 0.1–0.9)
MONO%: 34.9 % — AB (ref 0.0–13.0)
NEUT%: 51.1 % (ref 40.0–80.0)
NEUTROS ABS: 1.4 10*3/uL — AB (ref 1.5–6.5)
PLATELETS: 128 10*3/uL — AB (ref 145–400)
RBC: 3 10*6/uL — AB (ref 4.20–5.70)
RDW: 15.7 % (ref 11.1–15.7)
WBC: 2.8 10*3/uL — AB (ref 4.0–10.0)

## 2015-04-05 LAB — MAGNESIUM (CC13): MAGNESIUM: 1.5 mg/dL (ref 1.5–2.5)

## 2015-04-05 MED ORDER — HYDROMORPHONE HCL 1 MG/ML IJ SOLN
INTRAMUSCULAR | Status: AC
  Filled 2015-04-05: qty 2

## 2015-04-05 MED ORDER — HYDROMORPHONE HCL 1 MG/ML IJ SOLN
2.0000 mg | Freq: Once | INTRAMUSCULAR | Status: AC
  Administered 2015-04-05: 2 mg via INTRAVENOUS

## 2015-04-05 MED ORDER — DIPHENHYDRAMINE HCL 50 MG/ML IJ SOLN
INTRAMUSCULAR | Status: AC
  Filled 2015-04-05: qty 1

## 2015-04-05 MED ORDER — PALONOSETRON HCL INJECTION 0.25 MG/5ML
INTRAVENOUS | Status: AC
  Filled 2015-04-05: qty 5

## 2015-04-05 MED ORDER — DIPHENHYDRAMINE HCL 50 MG/ML IJ SOLN
50.0000 mg | Freq: Once | INTRAMUSCULAR | Status: AC
  Administered 2015-04-05: 50 mg via INTRAVENOUS

## 2015-04-05 MED ORDER — SODIUM CHLORIDE 0.9 % IV SOLN
Freq: Once | INTRAVENOUS | Status: AC
  Administered 2015-04-05: 12:00:00 via INTRAVENOUS
  Filled 2015-04-05: qty 5

## 2015-04-05 MED ORDER — PALONOSETRON HCL INJECTION 0.25 MG/5ML
0.2500 mg | Freq: Once | INTRAVENOUS | Status: AC
  Administered 2015-04-05: 0.25 mg via INTRAVENOUS

## 2015-04-05 MED ORDER — SODIUM CHLORIDE 0.9 % IV SOLN
Freq: Once | INTRAVENOUS | Status: AC
  Administered 2015-04-05: 11:00:00 via INTRAVENOUS

## 2015-04-05 MED ORDER — CETUXIMAB CHEMO IV INJECTION 200 MG/100ML
250.0000 mg/m2 | Freq: Once | INTRAVENOUS | Status: AC
  Administered 2015-04-05: 400 mg via INTRAVENOUS
  Filled 2015-04-05: qty 200

## 2015-04-05 NOTE — Patient Instructions (Signed)
[facility] Discharge Instructions for Patients Receiving Chemotherapy  Today you received the following chemotherapy agents Erbitux.  To help prevent nausea and vomiting after your treatment, we encourage you to take your nausea medication.   If you develop nausea and vomiting that is not controlled by your nausea medication, call the clinic.   BELOW ARE SYMPTOMS THAT SHOULD BE REPORTED IMMEDIATELY:  *FEVER GREATER THAN 100.5 F  *CHILLS WITH OR WITHOUT FEVER  NAUSEA AND VOMITING THAT IS NOT CONTROLLED WITH YOUR NAUSEA MEDICATION  *UNUSUAL SHORTNESS OF BREATH  *UNUSUAL BRUISING OR BLEEDING  TENDERNESS IN MOUTH AND THROAT WITH OR WITHOUT PRESENCE OF ULCERS  *URINARY PROBLEMS  *BOWEL PROBLEMS  UNUSUAL RASH Items with * indicate a potential emergency and should be followed up as soon as possible.  Feel free to call the clinic you have any questions or concerns. The clinic phone number is [phone].  Please show the CHEMO ALERT CARD at check-in to the Emergency Department and triage nurse.   

## 2015-04-12 ENCOUNTER — Other Ambulatory Visit: Payer: Self-pay | Admitting: Nurse Practitioner

## 2015-04-12 DIAGNOSIS — C069 Malignant neoplasm of mouth, unspecified: Secondary | ICD-10-CM

## 2015-04-12 DIAGNOSIS — C029 Malignant neoplasm of tongue, unspecified: Secondary | ICD-10-CM

## 2015-04-12 MED ORDER — OXYMORPHONE HCL 10 MG PO TABS: 10.0000 mg | ORAL_TABLET | ORAL | Status: DC | PRN

## 2015-04-13 ENCOUNTER — Other Ambulatory Visit: Payer: Self-pay | Admitting: *Deleted

## 2015-04-13 ENCOUNTER — Encounter: Payer: Self-pay | Admitting: Family

## 2015-04-13 ENCOUNTER — Ambulatory Visit (HOSPITAL_BASED_OUTPATIENT_CLINIC_OR_DEPARTMENT_OTHER): Payer: Medicaid Other | Admitting: Family

## 2015-04-13 ENCOUNTER — Other Ambulatory Visit (HOSPITAL_BASED_OUTPATIENT_CLINIC_OR_DEPARTMENT_OTHER): Payer: Medicaid Other

## 2015-04-13 VITALS — BP 105/43 | HR 71 | Temp 98.1°F | Resp 18 | Ht 70.0 in | Wt 124.0 lb

## 2015-04-13 DIAGNOSIS — C770 Secondary and unspecified malignant neoplasm of lymph nodes of head, face and neck: Secondary | ICD-10-CM

## 2015-04-13 DIAGNOSIS — C760 Malignant neoplasm of head, face and neck: Secondary | ICD-10-CM

## 2015-04-13 DIAGNOSIS — K047 Periapical abscess without sinus: Secondary | ICD-10-CM

## 2015-04-13 DIAGNOSIS — C069 Malignant neoplasm of mouth, unspecified: Secondary | ICD-10-CM

## 2015-04-13 LAB — CMP (CANCER CENTER ONLY)
ALK PHOS: 58 U/L (ref 26–84)
ALT: 13 U/L (ref 10–47)
AST: 14 U/L (ref 11–38)
Albumin: 3 g/dL — ABNORMAL LOW (ref 3.3–5.5)
BILIRUBIN TOTAL: 0.4 mg/dL (ref 0.20–1.60)
BUN: 14 mg/dL (ref 7–22)
CO2: 29 mEq/L (ref 18–33)
CREATININE: 0.7 mg/dL (ref 0.6–1.2)
Calcium: 9 mg/dL (ref 8.0–10.3)
Chloride: 95 mEq/L — ABNORMAL LOW (ref 98–108)
Glucose, Bld: 106 mg/dL (ref 73–118)
POTASSIUM: 4.2 meq/L (ref 3.3–4.7)
Sodium: 136 mEq/L (ref 128–145)
TOTAL PROTEIN: 6.8 g/dL (ref 6.4–8.1)

## 2015-04-13 LAB — CBC WITH DIFFERENTIAL (CANCER CENTER ONLY)
BASO#: 0 10*3/uL (ref 0.0–0.2)
BASO%: 0.2 % (ref 0.0–2.0)
EOS%: 0.2 % (ref 0.0–7.0)
Eosinophils Absolute: 0 10*3/uL (ref 0.0–0.5)
HEMATOCRIT: 28.8 % — AB (ref 38.7–49.9)
HGB: 9.2 g/dL — ABNORMAL LOW (ref 13.0–17.1)
LYMPH#: 0.4 10*3/uL — AB (ref 0.9–3.3)
LYMPH%: 7.8 % — AB (ref 14.0–48.0)
MCH: 31.5 pg (ref 28.0–33.4)
MCHC: 31.9 g/dL — ABNORMAL LOW (ref 32.0–35.9)
MCV: 99 fL — AB (ref 82–98)
MONO#: 0.6 10*3/uL (ref 0.1–0.9)
MONO%: 10.3 % (ref 0.0–13.0)
NEUT#: 4.5 10*3/uL (ref 1.5–6.5)
NEUT%: 81.5 % — ABNORMAL HIGH (ref 40.0–80.0)
PLATELETS: 91 10*3/uL — AB (ref 145–400)
RBC: 2.92 10*6/uL — AB (ref 4.20–5.70)
RDW: 16.2 % — ABNORMAL HIGH (ref 11.1–15.7)
WBC: 5.5 10*3/uL (ref 4.0–10.0)

## 2015-04-13 MED ORDER — AMOXICILLIN-POT CLAVULANATE 250-62.5 MG/5ML PO SUSR: 250.0000 mg | Freq: Two times a day (BID) | ORAL | Status: DC

## 2015-04-13 NOTE — Progress Notes (Signed)
Hematology and Oncology Follow Up Visit  Derek Campos [id number] 06/18/1970 44 y.o. 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 His his dentist office is still waiting for his previous cancer center in [place] to send information on the type of radiation he received. They have not followed up with him about an appointment.  He has significant submandibular adenopathy, more than usual. The area is warm and tender to the touch.  No other lymphadenopathy noted on exam.  No fever, chills, n/v, cough, rash, dizziness, SOB, chest pain, palpitations, abdominal pain or changes in bowel or bladder habits.  He has stayed hydrated and is doing his feeding through his J tube. His weight is up 6 lbs since his last visit.  He has had no swelling or tenderness in his extremities. No new joint aches or "bone pain." He has some mild numbness and tingling in his fingertips that is unchanged.  Unfortunately his mother recently passed away and his step father put him out of the house. He is now living with a friend from grade school in [place]. He plans to move back to [place] to be with his sister after he has his next PET scan. He plans to follow-up with his old cancer center there for treatment.   Medications:    Medication List       This list is accurate as of: 04/13/15  8:57 PM.  Always use your most recent med list.               amoxicillin-clavulanate 250-62.5 MG/5ML suspension  Commonly known as:  AUGMENTIN  Place 5 mLs (250 mg total) into feeding tube 2 (two) times daily. For 14 days     dexamethasone 4 MG tablet  Commonly known as:  DECADRON  Take 2 tablets (8 mg total) by mouth 2 (two) times  daily with a meal.     fentaNYL 75 MCG/HR  Commonly known as:  DURAGESIC - dosed mcg/hr  Place 2 patches (150 mcg total) onto the skin every other day.     gabapentin 600 MG tablet  Commonly known as:  NEURONTIN  Take 1 tablet (600 mg total) by mouth 3 (three) times daily.     lidocaine 2 % solution  Commonly known as:  XYLOCAINE  Use as directed 20 mLs in the mouth or throat every 3 (three) hours as needed for mouth pain.     LORazepam 0.5 MG tablet  Commonly known as:  ATIVAN  Take 1 tablet (0.5 mg total) by mouth every 8 (eight) hours.     magic mouthwash w/lidocaine Soln  Take 15 mLs by mouth 4 (four) times daily as needed for mouth pain.     oxymorphone 10 MG tablet  Commonly known as:  OPANA  Take 1-2 tablets (10-20 mg total) by mouth every 4 (four) hours as needed for pain.     Potassium Chloride 40 MEQ/15ML (20%) Soln  Take 15 mLs by mouth 2 (two) times daily.     promethazine 25 MG tablet  Commonly known as:  PHENERGAN  Take 1 tablet (25 mg total) by mouth every 6 (six) hours as needed for nausea or vomiting.     ranitidine 150 MG tablet  Commonly  known as:  ZANTAC  Take 1 tablet (150 mg total) by mouth 2 (two) times daily. Crush and put down feeding tube.        Allergies:  Allergies  Allergen Reactions  . Other     Cat Gut Stitches - unknown reaction.Told by parents.    Past Medical History, Surgical history, Social history, and Family History were reviewed and updated.  Review of Systems: All other 10 point review of systems is negative.   Physical Exam:  height is 5\' 10"  (1.778 m) and weight is 124 lb (56.246 kg). His oral temperature is 98.1 F (36.7 C). His blood pressure is 105/43 and his pulse is 71. His respiration is 18.   Wt Readings from Last 3 Encounters:  04/13/15 124 lb (56.246 kg)  03/22/15 118 lb (53.524 kg)  03/01/15 120 lb (54.432 kg)    Ocular: Sclerae unicteric, pupils equal, round and reactive to light Ear-nose-throat:  Oropharynx clear, has submandibular adenopathy Lymphatic: No axillary adenopathy Lungs no rales or rhonchi, good excursion bilaterally Heart regular rate and rhythm, no murmur appreciated Abd soft, nontender, positive bowel sounds MSK no focal spinal tenderness, no joint edema Neuro: non-focal, well-oriented, appropriate affect  Lab Results  Component Value Date   WBC 5.5 04/13/2015   HGB 9.2* 04/13/2015   HCT 28.8* 04/13/2015   MCV 99* 04/13/2015   PLT 91* 04/13/2015   Lab Results  Component Value Date   FERRITIN 394* 12/20/2014   IRON 42 12/20/2014   TIBC 261 12/20/2014   UIBC 219 12/20/2014   IRONPCTSAT 16* 12/20/2014   Lab Results  Component Value Date   RETICCTPCT 1.1 11/02/2014   RBC 2.92* 04/13/2015   No results found for: KPAFRELGTCHN, LAMBDASER, KAPLAMBRATIO No results found for: IGGSERUM, IGA, IGMSERUM No results found for: Odetta Pink, SPEI   Chemistry      Component Value Date/Time   NA 136 04/13/2015 1521   NA 139 12/03/2014 0629   NA 137 10/25/2014 1018   K 4.2 04/13/2015 1521   K 3.7 12/03/2014 0629   K 4.1 10/25/2014 1018   CL 95* 04/13/2015 1521   CL 103 12/03/2014 0629   CO2 29 04/13/2015 1521   CO2 26 12/03/2014 0629   CO2 26 10/25/2014 1018   BUN 14 04/13/2015 1521   BUN 18 12/03/2014 0629   BUN 15.3 10/25/2014 1018   CREATININE 0.7 04/13/2015 1521   CREATININE 0.83 12/03/2014 0629   CREATININE 0.7 10/25/2014 1018      Component Value Date/Time   CALCIUM 9.0 04/13/2015 1521   CALCIUM 8.6* 12/03/2014 0629   CALCIUM 9.3 10/25/2014 1018   ALKPHOS 58 04/13/2015 1521   ALKPHOS 110 12/02/2014 1504   ALKPHOS 74 10/25/2014 1018   AST 14 04/13/2015 1521   AST 29 12/02/2014 1504   AST 16 10/25/2014 1018   ALT 13 04/13/2015 1521   ALT 18 12/02/2014 1504   ALT 12 10/25/2014 1018   BILITOT 0.40 04/13/2015 1521   BILITOT 0.9 12/02/2014 1504   BILITOT 0.20 10/25/2014 1018     Impression  and Plan: Derek Campos is a 44 year old white gentleman with metastatic head and neck cancer He is here today with c/o neck swelling due to an abscessed tooth. He has still not heard back from his dentist's office. He has significant submandibular swelling. The area is warm and tender to the touch. He has had no fevers.  We will start him on Augmentin 250  mg BID down his J tube for 14 days. We will check with him on Friday and see how he is feeling.  Unfortunately his living arrangement is complicated at this time. He is very thankful for his friend that has taken him in. She brought him today.  We will plan to see him back on December 15th for follow-up. He will also have scans this same day. He would like Dr. Antonieta Pert input on his future treatment before he moves back to [place]. He knows to contact us with any questions or concerns. We can certainly bring him in sooner if need be.   Eliezer Bottom, NP 11/30/20168:57 PM

## 2015-04-20 ENCOUNTER — Other Ambulatory Visit: Payer: Self-pay | Admitting: *Deleted

## 2015-04-20 DIAGNOSIS — C069 Malignant neoplasm of mouth, unspecified: Secondary | ICD-10-CM

## 2015-04-20 DIAGNOSIS — C029 Malignant neoplasm of tongue, unspecified: Secondary | ICD-10-CM

## 2015-04-20 DIAGNOSIS — C770 Secondary and unspecified malignant neoplasm of lymph nodes of head, face and neck: Secondary | ICD-10-CM

## 2015-04-20 MED ORDER — AMOXICILLIN-POT CLAVULANATE 250-62.5 MG/5ML PO SUSR: 250.0000 mg | Freq: Two times a day (BID) | ORAL | Status: AC

## 2015-04-20 MED ORDER — LIDOCAINE VISCOUS 2 % MT SOLN: 20.0000 mL | OROMUCOSAL | Status: DC | PRN

## 2015-04-21 ENCOUNTER — Telehealth: Payer: Self-pay | Admitting: *Deleted

## 2015-04-21 ENCOUNTER — Other Ambulatory Visit: Payer: Self-pay | Admitting: Family

## 2015-04-21 DIAGNOSIS — C069 Malignant neoplasm of mouth, unspecified: Secondary | ICD-10-CM

## 2015-04-21 DIAGNOSIS — C029 Malignant neoplasm of tongue, unspecified: Secondary | ICD-10-CM

## 2015-04-21 MED ORDER — FENTANYL 100 MCG/HR TD PT72: 200.0000 ug | MEDICATED_PATCH | TRANSDERMAL | Status: DC

## 2015-04-21 NOTE — Telephone Encounter (Signed)
Patient calling office stating he needs more oral pain meds. This office just prescribed Opana 10mg  with a quantity of 240 pills just 8 days ago. He states he has gone through this supply. He states that due to the infection in his jaw, his pain needs increased and he was taking 5 to 6 pills a time. His prescription is for 1-2 pills. Educated patient that he CANNOT take narcotics on any schedule or dose than what is prescribed.   Spoke to Judson Roch NP and Dr Marin Olp. They cannot prescribe patient any further po pain medications at this time, but will increase his patch.   Explained this to patient. He understood and will come pick up fentanyl patch prescription.

## 2015-04-26 ENCOUNTER — Encounter: Payer: Self-pay | Admitting: Hematology & Oncology

## 2015-04-28 ENCOUNTER — Other Ambulatory Visit: Payer: Self-pay | Admitting: *Deleted

## 2015-04-28 ENCOUNTER — Ambulatory Visit (HOSPITAL_BASED_OUTPATIENT_CLINIC_OR_DEPARTMENT_OTHER)
Admission: RE | Admit: 2015-04-28 | Discharge: 2015-04-28 | Disposition: A | Discharge status: Home / Self Care | Payer: Medicaid Other | Source: Ambulatory Visit | Attending: Family | Admitting: Family

## 2015-04-28 ENCOUNTER — Encounter (HOSPITAL_BASED_OUTPATIENT_CLINIC_OR_DEPARTMENT_OTHER): Payer: Self-pay

## 2015-04-28 ENCOUNTER — Other Ambulatory Visit (HOSPITAL_BASED_OUTPATIENT_CLINIC_OR_DEPARTMENT_OTHER): Payer: Medicaid Other

## 2015-04-28 ENCOUNTER — Ambulatory Visit (HOSPITAL_BASED_OUTPATIENT_CLINIC_OR_DEPARTMENT_OTHER): Payer: Medicaid Other | Admitting: Family

## 2015-04-28 ENCOUNTER — Encounter: Payer: Self-pay | Admitting: Hematology & Oncology

## 2015-04-28 VITALS — BP 109/69 | HR 74 | Temp 97.9°F | Resp 20 | Wt 125.0 lb

## 2015-04-28 DIAGNOSIS — C770 Secondary and unspecified malignant neoplasm of lymph nodes of head, face and neck: Secondary | ICD-10-CM | POA: Diagnosis not present

## 2015-04-28 DIAGNOSIS — C069 Malignant neoplasm of mouth, unspecified: Secondary | ICD-10-CM | POA: Insufficient documentation

## 2015-04-28 DIAGNOSIS — C029 Malignant neoplasm of tongue, unspecified: Secondary | ICD-10-CM

## 2015-04-28 DIAGNOSIS — R911 Solitary pulmonary nodule: Secondary | ICD-10-CM | POA: Insufficient documentation

## 2015-04-28 DIAGNOSIS — R590 Localized enlarged lymph nodes: Secondary | ICD-10-CM | POA: Insufficient documentation

## 2015-04-28 DIAGNOSIS — J439 Emphysema, unspecified: Secondary | ICD-10-CM | POA: Diagnosis not present

## 2015-04-28 DIAGNOSIS — M899 Disorder of bone, unspecified: Secondary | ICD-10-CM | POA: Insufficient documentation

## 2015-04-28 LAB — CBC WITH DIFFERENTIAL (CANCER CENTER ONLY)
BASO#: 0 10*3/uL (ref 0.0–0.2)
BASO%: 0.2 % (ref 0.0–2.0)
EOS%: 1.4 % (ref 0.0–7.0)
Eosinophils Absolute: 0.1 10*3/uL (ref 0.0–0.5)
HCT: 34 % — ABNORMAL LOW (ref 38.7–49.9)
HGB: 10.8 g/dL — ABNORMAL LOW (ref 13.0–17.1)
LYMPH#: 0.4 10*3/uL — ABNORMAL LOW (ref 0.9–3.3)
LYMPH%: 6.9 % — AB (ref 14.0–48.0)
MCH: 30.9 pg (ref 28.0–33.4)
MCHC: 31.8 g/dL — AB (ref 32.0–35.9)
MCV: 97 fL (ref 82–98)
MONO#: 0.5 10*3/uL (ref 0.1–0.9)
MONO%: 10.5 % (ref 0.0–13.0)
NEUT#: 4.1 10*3/uL (ref 1.5–6.5)
NEUT%: 81 % — AB (ref 40.0–80.0)
PLATELETS: 208 10*3/uL (ref 145–400)
RBC: 3.49 10*6/uL — ABNORMAL LOW (ref 4.20–5.70)
RDW: 15.3 % (ref 11.1–15.7)
WBC: 5.1 10*3/uL (ref 4.0–10.0)

## 2015-04-28 LAB — COMPREHENSIVE METABOLIC PANEL
ALBUMIN: 3.7 g/dL (ref 3.5–5.0)
ALK PHOS: 79 U/L (ref 40–150)
ALT: 11 U/L (ref 0–55)
AST: 17 U/L (ref 5–34)
Anion Gap: 10 mEq/L (ref 3–11)
BUN: 16.2 mg/dL (ref 7.0–26.0)
CHLORIDE: 99 meq/L (ref 98–109)
CO2: 28 mEq/L (ref 22–29)
Calcium: 9.4 mg/dL (ref 8.4–10.4)
Creatinine: 0.7 mg/dL (ref 0.7–1.3)
GLUCOSE: 94 mg/dL (ref 70–140)
POTASSIUM: 5.1 meq/L (ref 3.5–5.1)
SODIUM: 137 meq/L (ref 136–145)
Total Protein: 7.4 g/dL (ref 6.4–8.3)

## 2015-04-28 MED ORDER — OXYMORPHONE HCL 10 MG PO TABS: 10.0000 mg | ORAL_TABLET | ORAL | Status: DC | PRN

## 2015-04-28 MED ORDER — FENTANYL 100 MCG/HR TD PT72: 200.0000 ug | MEDICATED_PATCH | TRANSDERMAL | Status: DC

## 2015-04-28 MED ORDER — IOHEXOL 300 MG/ML  SOLN
100.0000 mL | Freq: Once | INTRAMUSCULAR | Status: AC | PRN
  Administered 2015-04-28: 100 mL via INTRAVENOUS

## 2015-04-28 MED ORDER — LIDOCAINE VISCOUS 2 % MT SOLN: 20.0000 mL | OROMUCOSAL | Status: AC | PRN

## 2015-04-28 NOTE — Progress Notes (Signed)
Hematology and Oncology Follow Up Visit  Derek Campos [id number] [birth year] 44 y.o. 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 His CT scans today showed interval enlargement of left hilar lymph node, stable cystic lesions within the right upper lobe and stable L5 lytic lesion.  He is still having pain due to the abscessed tooth. He states that Dr. Sondra Come recommended hyperbaric therapy. He has requested his records be sent to a dentist office in [place], [place] where he is moving the first week of January. He prefers to start this after he has moved.  He is currently living in [place] with a friend and states that this will be his last visit with [place] before his big move.  He has been looking for an oncologist in that area as well. We will send a referral and all records once he knows where he is going.  He still has bilateral submandibular adenopathy but this is slightly improved. No other lymphadenopathy noted on exam.  No fever, chills, n/v, cough, rash, dizziness, SOB, chest pain, palpitations, abdominal pain or changes in bowel or bladder habits.  He is still having copious amounts of secretions but does not want to take Robinul or any medication like it because he feels it "drys him out too much." He is unable to swallow but stays hydrated and does regular feedings throughout the day through his J tube. His weight is stable at this time.  He has had no swelling or tenderness in his extremities. No new joint aches or "bone pain." The numbness and tingling in his fingertips and feet is slightly better.    Medications:    Medication List       This list is accurate as of: 04/28/15  9:41 PM.  Always use your most recent med  list.               amoxicillin-clavulanate 250-62.5 MG/5ML suspension  Commonly known as:  AUGMENTIN  Place 5 mLs (250 mg total) into feeding tube 2 (two) times daily. For 7 days     dexamethasone 4 MG tablet  Commonly known as:  DECADRON  Take 2 tablets (8 mg total) by mouth 2 (two) times daily with a meal.     fentaNYL 100 MCG/HR  Commonly known as:  DURAGESIC - dosed mcg/hr  Place 2 patches (200 mcg total) onto the skin every other day.     gabapentin 600 MG tablet  Commonly known as:  NEURONTIN  Take 1 tablet (600 mg total) by mouth 3 (three) times daily.     lidocaine 2 % solution  Commonly known as:  XYLOCAINE  Use as directed 20 mLs in the mouth or throat every 3 (three) hours as needed for mouth pain.     LORazepam 0.5 MG tablet  Commonly known as:  ATIVAN  Take 1 tablet (0.5 mg total) by mouth every 8 (eight) hours.     magic mouthwash w/lidocaine Soln  Take 15 mLs by mouth 4 (four) times daily as needed for mouth pain.     oxymorphone 10 MG tablet  Commonly known as:  OPANA  Take 1-2 tablets (10-20 mg total) by mouth every 4 (four) hours as needed for pain.     Potassium Chloride 40 MEQ/15ML (20%)  Soln  Take 15 mLs by mouth 2 (two) times daily.     promethazine 25 MG tablet  Commonly known as:  PHENERGAN  Take 1 tablet (25 mg total) by mouth every 6 (six) hours as needed for nausea or vomiting.     ranitidine 150 MG tablet  Commonly known as:  ZANTAC  Take 1 tablet (150 mg total) by mouth 2 (two) times daily. Crush and put down feeding tube.        Allergies:  Allergies  Allergen Reactions  . Other     Cat Gut Stitches - unknown reaction.Told by parents.    Past Medical History, Surgical history, Social history, and Family History were reviewed and updated.  Review of Systems: All other 10 point review of systems is negative.   Physical Exam:  weight is 125 lb (56.7 kg). His oral temperature is 97.9 F (36.6 C). His blood pressure is 109/69  and his pulse is 74. His respiration is 20.   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, bilateral submandibular adenopathy Lymphatic: No axillary adenopathy Lungs no rales or rhonchi, good excursion bilaterally Heart regular rate and rhythm, no murmur appreciated Abd soft, nontender, positive bowel sounds, J-tube intact MSK no focal spinal tenderness, no joint edema Neuro: non-focal, well-oriented, appropriate affect  Lab Results  Component Value Date   WBC 5.1 04/28/2015   HGB 10.8* 04/28/2015   HCT 34.0* 04/28/2015   MCV 97 04/28/2015   PLT 208 04/28/2015   Lab Results  Component Value Date   FERRITIN 394* 12/20/2014   IRON 42 12/20/2014   TIBC 261 12/20/2014   UIBC 219 12/20/2014   IRONPCTSAT 16* 12/20/2014   Lab Results  Component Value Date   RETICCTPCT 1.1 11/02/2014   RBC 3.49* 04/28/2015   No results found for: KPAFRELGTCHN, LAMBDASER, KAPLAMBRATIO No results found for: IGGSERUM, IGA, IGMSERUM No results found for: Kathrynn Ducking, MSPIKE, SPEI   Chemistry      Component Value Date/Time   NA 137 04/28/2015 1329   NA 136 04/13/2015 1521   NA 139 12/03/2014 0629   K 5.1 04/28/2015 1329   K 4.2 04/13/2015 1521   K 3.7 12/03/2014 0629   CL 95* 04/13/2015 1521   CL 103 12/03/2014 0629   CO2 28 04/28/2015 1329   CO2 29 04/13/2015 1521   CO2 26 12/03/2014 0629   BUN 16.2 04/28/2015 1329   BUN 14 04/13/2015 1521   BUN 18 12/03/2014 0629   CREATININE 0.7 04/28/2015 1329   CREATININE 0.7 04/13/2015 1521   CREATININE 0.83 12/03/2014 0629      Component Value Date/Time   CALCIUM 9.4 04/28/2015 1329   CALCIUM 9.0 04/13/2015 1521   CALCIUM 8.6* 12/03/2014 0629   ALKPHOS 79 04/28/2015  1329   ALKPHOS 58 04/13/2015 1521   ALKPHOS 110 12/02/2014 1504   AST 17 04/28/2015 1329   AST 14 04/13/2015 1521   AST 29 12/02/2014 1504   ALT 11 04/28/2015 1329   ALT 13 04/13/2015 1521   ALT 18 12/02/2014 1504   BILITOT <0.30 04/28/2015 1329   BILITOT 0.40 04/13/2015 1521   BILITOT 0.9 12/02/2014 1504     Impression and Plan: Mr. Arslan is a  44 year old white gentleman with metastatic head and neck cancer. He was previously treated In [place], [place] at [facility] with 3 cycles of Cisplatin and 35 fractions of radiation in January 2016.  Once he moved to [place] in February of 2016 he began having symptoms of recurrence. PET scan in March showed metastatic disease of the head and neck  He has completed 9 cycles of Carboplatin/Taxotere/Erbitux here. CT scans on 04/28/15 showed interval enlargement of left hilar lymph nodes. Cystic lesions within the right upper lobe and L5 lytic lesion appear stable.  Now that his mother has passed away he is moving back to [place] to live near his sister. He is currently staying with a friend in [place] so today will be the last time we will see him in our office. It has been a pleasure caring for him and we will gladly see him again if he ends up back in this area.  He is currently looking for a cancer center near [place], [place]. He will let us know where to send his referral and records.   We did refill his pain medication prescriptions today.  He can certainly contact us with any questions or concerns. This was a shared visit with Dr. Marin Olp and he is in agreement with the afore mentioned.   Eliezer Bottom, NP 12/15/20169:41 PM    Addendum:  I saw and examined Mr. Glad.  It is said that he has to move back to [place]. However, this is where his family is and I certainly understand the reason why.   He is unwell with chemotherapy. He actually has responded. His disease might be getting a little bit worse now. I think  that the next option for him which would be very well tolerated would be immunotherapy.   The FDA recently approved Opdivo or Keytruda for metastatic squamous cell carcinoma the head and neck.  I'm sure down [place], we will try to find a cancer center that he will be able to go to. It might be difficult given the fact that he has no insurance.   I told him that we would always be oh to see him. If he ever decides to come back to the area.   His been a privilege and pleasure to have been able to help him.   Lum Keas

## 2015-05-03 ENCOUNTER — Encounter: Payer: Self-pay | Admitting: Family

## 2015-05-03 ENCOUNTER — Encounter: Payer: Self-pay | Admitting: Hematology & Oncology

## 2015-05-05 ENCOUNTER — Telehealth: Payer: Self-pay | Admitting: *Deleted

## 2015-05-05 NOTE — Telephone Encounter (Signed)
Patient relocating to [place]. He would like his care transferred to [facility] in [place].   Spoke with Deanna, new patient coordinator, and medical records faxed.  Phone [phone] ext [phone] Fax [phone]

## 2015-05-06 ENCOUNTER — Other Ambulatory Visit: Payer: Self-pay | Admitting: Family

## 2015-05-12 ENCOUNTER — Other Ambulatory Visit: Payer: Self-pay | Admitting: Emergency Medicine

## 2015-05-12 ENCOUNTER — Ambulatory Visit (HOSPITAL_BASED_OUTPATIENT_CLINIC_OR_DEPARTMENT_OTHER): Payer: Medicaid Other

## 2015-05-12 ENCOUNTER — Encounter: Payer: Self-pay | Admitting: Family

## 2015-05-12 ENCOUNTER — Ambulatory Visit (HOSPITAL_BASED_OUTPATIENT_CLINIC_OR_DEPARTMENT_OTHER): Payer: Medicaid Other | Admitting: Family

## 2015-05-12 VITALS — BP 111/77 | HR 88 | Temp 98.8°F | Resp 16 | Ht 70.0 in | Wt 125.0 lb

## 2015-05-12 DIAGNOSIS — M272 Inflammatory conditions of jaws: Secondary | ICD-10-CM | POA: Diagnosis present

## 2015-05-12 DIAGNOSIS — C069 Malignant neoplasm of mouth, unspecified: Secondary | ICD-10-CM

## 2015-05-12 DIAGNOSIS — K1379 Other lesions of oral mucosa: Secondary | ICD-10-CM

## 2015-05-12 DIAGNOSIS — C760 Malignant neoplasm of head, face and neck: Secondary | ICD-10-CM | POA: Diagnosis not present

## 2015-05-12 DIAGNOSIS — C029 Malignant neoplasm of tongue, unspecified: Secondary | ICD-10-CM

## 2015-05-12 MED ORDER — HYDROMORPHONE HCL 1 MG/ML IJ SOLN
2.0000 mg | Freq: Once | INTRAMUSCULAR | Status: AC
  Administered 2015-05-12: 2 mg via INTRAVENOUS

## 2015-05-12 MED ORDER — CEPHALEXIN 500 MG PO CAPS: 500.0000 mg | ORAL_CAPSULE | Freq: Two times a day (BID) | ORAL | Status: AC

## 2015-05-12 MED ORDER — HYDROMORPHONE HCL 1 MG/ML IJ SOLN
INTRAMUSCULAR | Status: AC
  Filled 2015-05-12: qty 2

## 2015-05-12 MED ORDER — FENTANYL 50 MCG/HR TD PT72: 50.0000 ug | MEDICATED_PATCH | TRANSDERMAL | Status: AC

## 2015-05-12 MED ORDER — FENTANYL 100 MCG/HR TD PT72: 200.0000 ug | MEDICATED_PATCH | TRANSDERMAL | Status: DC

## 2015-05-12 MED ORDER — OXYMORPHONE HCL 10 MG PO TABS: 10.0000 mg | ORAL_TABLET | ORAL | Status: DC | PRN

## 2015-05-12 MED ORDER — FENTANYL 100 MCG/HR TD PT72: 200.0000 ug | MEDICATED_PATCH | TRANSDERMAL | Status: AC

## 2015-05-12 MED ORDER — SODIUM CHLORIDE 0.9 % IV SOLN
4.0000 mg/kg | Freq: Once | INTRAVENOUS | Status: AC
  Administered 2015-05-12: 227 mg via INTRAVENOUS
  Filled 2015-05-12: qty 4.54

## 2015-05-12 MED ORDER — OXYMORPHONE HCL 10 MG PO TABS: 10.0000 mg | ORAL_TABLET | ORAL | Status: AC | PRN

## 2015-05-12 NOTE — Progress Notes (Signed)
Hematology and Oncology Follow Up Visit  Derek Campos [id number] 09-08-1970 44 y.o. 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 He will be following with a dentist in [place] for hyperbaric treatment if the infection once he moves. He will be leaving Saturday. We will treat him with IV and PO antibiotics.  He has a scab under the left side of his chin where he says he opened it up and squeezed out copious amounts of puss.  He states that he is wearing 2 new pain patches today which have helped with the pan but that he has had to take his medication that goes down his j-tube more often than prescribed to effectively maintain his pain. I will discuss adjusting his pain medication dosage and frequency with [name]. Pain control has been an ongoing issue for Derek Campos throughout his treatment and time with [place]. There is still submandibular lymphadenopathy and edema of the neck in the area of infection. He still has a great deal of secretions.  He has had no fever, chills, n/v, cough, rash, dizziness, SOB, chest pain, palpitations, abdominal pain or changes in bowel or bladder habits.   Medications:    Medication List       This list is accurate as of: 05/12/15  9:00 PM.  Always use your most recent med list.               amoxicillin-clavulanate 250-62.5 MG/5ML suspension  Commonly known as:  AUGMENTIN  Place 5 mLs (250 mg total) into feeding tube 2 (two) times daily. For 7 days     cephALEXin 500 MG capsule  Commonly known as:  KEFLEX  Take 1 capsule (500 mg total) by mouth 2 (two) times daily.     dexamethasone 4 MG tablet  Commonly known as:  DECADRON  Take 2 tablets (8 mg total) by mouth  2 (two) times daily with a meal.     fentaNYL 50 MCG/HR  Commonly known as:  DURAGESIC - dosed mcg/hr  Place 1 patch (50 mcg total) onto the skin every other day.     fentaNYL 100 MCG/HR  Commonly known as:  DURAGESIC - dosed mcg/hr  Place 2 patches (200 mcg total) onto the skin every other day.     gabapentin 600 MG tablet  Commonly known as:  NEURONTIN  Take 1 tablet (600 mg total) by mouth 3 (three) times daily.     lidocaine 2 % solution  Commonly known as:  XYLOCAINE  Use as directed 20 mLs in the mouth or throat every 3 (three) hours as needed for mouth pain.     LORazepam 0.5 MG tablet  Commonly known as:  ATIVAN  Take 1 tablet (0.5 mg total) by mouth every 8 (eight) hours.     magic mouthwash w/lidocaine Soln  Take 15 mLs by mouth 4 (four) times daily as needed for mouth pain.     oxymorphone 10 MG tablet  Commonly known as:  OPANA  Take 1-2 tablets (10-20 mg total) by mouth every 2 (two) hours as needed for pain.     Potassium Chloride 40 MEQ/15ML (20%) Soln  Take 15 mLs by mouth 2 (two) times daily.     promethazine 25  MG tablet  Commonly known as:  PHENERGAN  Take 1 tablet (25 mg total) by mouth every 6 (six) hours as needed for nausea or vomiting.     ranitidine 150 MG tablet  Commonly known as:  ZANTAC  Take 1 tablet (150 mg total) by mouth 2 (two) times daily. Crush and put down feeding tube.        Allergies:  Allergies  Allergen Reactions  . Other     Cat Gut Stitches - unknown reaction.Told by parents.    Past Medical History, Surgical history, Social history, and Family History were reviewed and updated.  Review of Systems: All other 10 point review of systems is negative.   Physical Exam:  height is 5\' 10"  (1.778 m) and weight is 125 lb (56.7 kg). His oral temperature is 98.8 F (37.1 C). His blood pressure is 111/77 and his pulse is 88. His respiration is 16.   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, bilateral submandibular adenopathy Lymphatic: No axillary adenopathy Lungs no rales or rhonchi, good excursion bilaterally Heart regular rate and rhythm, no murmur appreciated Abd soft, nontender, positive bowel sounds, J-tube intact, no liver or spleen tip palpated on exam MSK no focal spinal tenderness, no joint edema Neuro: non-focal, well-oriented, appropriate affect  Lab Results  Component Value Date   WBC 5.1 04/28/2015   HGB 10.8* 04/28/2015   HCT 34.0* 04/28/2015   MCV 97 04/28/2015   PLT 208 04/28/2015   Lab Results  Component Value Date   FERRITIN 394* 12/20/2014   IRON 42 12/20/2014   TIBC 261 12/20/2014   UIBC 219 12/20/2014   IRONPCTSAT 16* 12/20/2014   Lab Results  Component Value Date   RETICCTPCT 1.1 11/02/2014   RBC 3.49* 04/28/2015   No results found for: KPAFRELGTCHN, LAMBDASER, KAPLAMBRATIO No results found for: IGGSERUM, IGA, IGMSERUM No results found for: Kathrynn Ducking, MSPIKE, SPEI   Chemistry      Component Value Date/Time   NA 137 04/28/2015 1329   NA 136 04/13/2015 1521   NA 139 12/03/2014 0629   K 5.1 04/28/2015 1329   K 4.2 04/13/2015 1521   K 3.7 12/03/2014 0629   CL 95* 04/13/2015 1521   CL 103 12/03/2014 0629   CO2 28 04/28/2015 1329   CO2 29 04/13/2015 1521   CO2 26 12/03/2014 0629   BUN 16.2 04/28/2015 1329   BUN 14 04/13/2015 1521   BUN 18 12/03/2014 0629   CREATININE 0.7 04/28/2015 1329   CREATININE 0.7 04/13/2015 1521   CREATININE 0.83 12/03/2014 0629      Component Value Date/Time   CALCIUM 9.4 04/28/2015 1329   CALCIUM 9.0 04/13/2015 1521   CALCIUM 8.6* 12/03/2014 0629   ALKPHOS 79 04/28/2015 1329   ALKPHOS 58 04/13/2015 1521   ALKPHOS 110 12/02/2014 1504   AST 17 04/28/2015  1329   AST 14 04/13/2015 1521   AST 29 12/02/2014 1504   ALT 11 04/28/2015 1329   ALT 13 04/13/2015 1521   ALT 18 12/02/2014 1504   BILITOT <0.30 04/28/2015 1329   BILITOT 0.40 04/13/2015 1521   BILITOT 0.9 12/02/2014 1504     Impression and Plan: Derek Campos is a 44 year old white  gentleman with metastatic head and neck cancer. He is here today for antibiotics for the infection he has of the left jaw. He will start hyperbaric treatments once he get established with a dentist in [place].  We will give him a dose of IV Cubicin today in the office. He will go home on Keflex 500 mg BID for 10 days.   He has the name of his new Oncologist in [place] and will contact Medicaid once he arrives on Saturday and schedule his appointment.  I discussed his pain management issue with [name] and we did adjust his dosages and frequency. Oxymorphone dosage will remain the same but frequency will be every 2 hours as needed for pain. His Fentanyl duragesic patch was increased to 250 mcg q 48 hours. His new prescriptions were printed and given to him before leaving.  He can certainly contact us with any questions or concerns.  Eliezer Bottom, NP 12/29/20169:00 PM

## 2015-05-12 NOTE — Patient Instructions (Signed)
Hydromorphone injection What is this medicine? HYDROMORPHONE (hye droe MOR fone) is a pain reliever. It is used to treat moderate to severe pain. This medicine may be used for other purposes; ask your health care provider or pharmacist if you have questions. What should I tell my health care provider before I take this medicine? They need to know if you have any of these conditions: -brain tumor -drug abuse or addiction -head injury -heart disease -frequently drink alcohol containing drinks -kidney disease -liver disease -lung disease, asthma, or breathing problems -an allergic or unusual reaction to hydromorphone, other opioid analgesics, latex, other medicines, foods, dyes, or preservatives -pregnant or trying to get pregnant -breast-feeding How should I use this medicine? This medicine is for injection into a vein, into a muscle, or under the skin. It is usually given by a health care professional in a hospital or clinic setting. If you get this medicine at home, you will be taught how to prepare and give this medicine. Use exactly as directed. Take your medicine at regular intervals. Do not take your medicine more often than directed. It is important that you put your used needles and syringes in a special sharps container. Do not put them in a trash can. If you do not have a sharps container, call your pharmacist or healthcare provider to get one. Talk to your pediatrician regarding the use of this medicine in children. This medicine is not approved for use in children. Overdosage: If you think you have taken too much of this medicine contact a poison control center or emergency room at once. NOTE: This medicine is only for you. Do not share this medicine with others. What if I miss a dose? If you miss a dose, use it as soon as you can. If it is almost time for your next dose, use only that dose. Do not use double or extra doses. What may interact with this  medicine? -alcohol -antihistamines for allergy, cough and cold -medicines for anesthesia -medicines for depression, anxiety, or psychotic disturbances -medicines for sleep -muscle relaxants -naltrexone -narcotic medicines (opiates) for pain -phenothiazines like chlorpromazine, mesoridazine, prochlorperazine, thioridazine -tramadol This list may not describe all possible interactions. Give your health care provider a list of all the medicines, herbs, non-prescription drugs, or dietary supplements you use. Also tell them if you smoke, drink alcohol, or use illegal drugs. Some items may interact with your medicine. What should I watch for while using this medicine? Tell your doctor or health care professional if your pain does not go away, if it gets worse, or if you have new or a different type of pain. You may develop tolerance to the medicine. Tolerance means that you will need a higher dose of the medicine for pain relief. Tolerance is normal and is expected if you take this medicine for a long time. Do not suddenly stop taking your medicine because you may develop a severe reaction. Your body becomes used to the medicine. This does NOT mean you are addicted. Addiction is a behavior related to getting and using a drug for a non-medical reason. If you have pain, you have a medical reason to take pain medicine. Your doctor will tell you how much medicine to take. If your doctor wants you to stop the medicine, the dose will be slowly lowered over time to avoid any side effects. You may get drowsy or dizzy. Do not drive, use machinery, or do anything that needs mental alertness until you know how this medicine  affects you. Do not stand or sit up quickly, especially if you are an older patient. This reduces the risk of dizzy or fainting spells. Alcohol may interfere with the effect of this medicine. Avoid alcoholic drinks. There are different types of narcotic medicines (opiates) for pain. If you take  more than one type at the same time, you may have more side effects. Give your health care provider a list of all medicines you use. Your doctor will tell you how much medicine to take. Do not take more medicine than directed. Call emergency for help if you have problems breathing. This medicine will cause constipation. Try to have a bowel movement at least every 2 to 3 days. If you do not have a bowel movement for 3 days, call your doctor or health care professional. Your mouth may get dry. Chewing sugarless gum or sucking hard candy, and drinking plenty of water may help. Contact your doctor if the problem does not go away or is severe. What side effects may I notice from receiving this medicine? Side effects that you should report to your doctor or health care professional as soon as possible: -allergic reactions like skin rash, itching or hives, swelling of the face, lips, or tongue -breathing problems -changes in vision -confusion -feeling faint or lightheaded, falls -seizures -slow or fast heartbeat -trouble passing urine or change in the amount of urine -trouble with balance, talking, walking -unusually weak or tired Side effects that usually do not require medical attention (report to your doctor or health care professional if they continue or are bothersome): -difficulty sleeping -drowsiness -dry mouth -flushing -headache -itching -loss of appetite -nausea, vomiting This list may not describe all possible side effects. Call your doctor for medical advice about side effects. You may report side effects to FDA at 1-800-FDA-1088. Where should I keep my medicine? Keep out of the reach of children. This medicine can be abused. Keep your medicine in a safe place to protect it from theft. Do not share this medicine with anyone. Selling or giving away this medicine is dangerous and against the law. If you are using this medicine at home, you will be instructed on how to store this  medicine. This medicine may cause accidental overdose and death if it is taken by other adults, children, or pets. Flush any unused medicine down the toilet to reduce the chance of harm. Do not use the medicine after the expiration date. NOTE: This sheet is a summary. It may not cover all possible information. If you have questions about this medicine, talk to your doctor, pharmacist, or health care provider.    2016, [employer]. (2012-12-02 10:47:33) Daptomycin injection What is this medicine? DAPTOMYCIN (DAP toe MYE sin) is a lipopeptide antibiotic. It is used to treat certain kinds of bacterial infections. It will not work for colds, flu, or other viral infections. This medicine may be used for other purposes; ask your health care provider or pharmacist if you have questions. What should I tell my health care provider before I take this medicine? They need to know if you have any of these conditions: -kidney disease -an unusual or allergic reaction to daptomycin, other medicines, foods, dyes, or preservatives -pregnant or trying to get pregnant -breast-feeding How should I use this medicine? This medicine is for infusion into a vein. It is usually given by a health care professional in a hospital or clinic setting. If you get this medicine at home, you will be taught how to prepare  and give this medicine. Use exactly as directed. Take your medicine at regular intervals. Do not take your medicine more often than directed. Take all of your medicine as directed even if you think you are better. Do not skip doses or stop your medicine early. It is important that you put your used needles and syringes in a special sharps container. Do not put them in a trash can. If you do not have a sharps container, call your pharmacist or healthcare provider to get one. Talk to your pediatrician regarding the use of this medicine in children. Special care may be needed. Overdosage: If you think you  have taken too much of this medicine contact a poison control center or emergency room at once. NOTE: This medicine is only for you. Do not share this medicine with others. What if I miss a dose? If you miss a dose, take it as soon as you can. If it is almost time for your next dose, take only that dose. Do not take double or extra doses. What may interact with this medicine? -birth control pills -some antibiotics like tobramycin This list may not describe all possible interactions. Give your health care provider a list of all the medicines, herbs, non-prescription drugs, or dietary supplements you use. Also tell them if you smoke, drink alcohol, or use illegal drugs. Some items may interact with your medicine. What should I watch for while using this medicine? Your condition will be monitored carefully while you are receiving this medicine. Do not treat diarrhea with over the counter products. Contact your doctor if you have diarrhea that lasts more than 2 days or if it is severe and watery. What side effects may I notice from receiving this medicine? Side effects that you should report to your doctor or health care professional as soon as possible: -allergic reactions like skin rash, itching or hives, swelling of the face, lips, or tongue -breathing problems -fever, infection -high or low blood pressure -muscle pain -numb or tingling pain -trouble passing urine or change in the amount of urine -unusually tired or weak -vomiting Side effects that usually do not require medical attention (report to your doctor or health care professional if they continue or are bothersome): -constipation or diarrhea -trouble sleeping -headache -nausea -stomach upset This list may not describe all possible side effects. Call your doctor for medical advice about side effects. You may report side effects to FDA at 1-800-FDA-1088. Where should I keep my medicine? Keep out of the reach of children. If you  are using this medicine at home, you will be instructed on how to store this medicine. Throw away any unused medicine after the expiration date on the label. NOTE: This sheet is a summary. It may not cover all possible information. If you have questions about this medicine, talk to your doctor, pharmacist, or health care provider.    2016, [employer] Standard. (2012-12-05 07:33:48)

## 2015-05-27 ENCOUNTER — Telehealth: Payer: Self-pay | Admitting: *Deleted

## 2015-05-27 NOTE — Telephone Encounter (Signed)
Patient is now wanting to initiate care at [facility]. Received release of medical records consent.   All records faxed to  [facility] [address] [facility] [address] [place] [id number] [phone]  Medical release placed in scan bin

## 2015-06-02 ENCOUNTER — Encounter: Payer: Self-pay | Admitting: Hematology & Oncology

## 2015-06-09 ENCOUNTER — Telehealth: Payer: Self-pay | Admitting: Hematology & Oncology

## 2015-06-09 NOTE — Telephone Encounter (Signed)
Faxed medical records  To: [facility] [address] [place], [place]  [id number] P: [phone] F: [phone]  Scheduled to see: Dr. Daryll Drown MD Patient: [phone]     COPY SCANNED

## 2015-07-13 DEATH — deceased

## 2016-03-10 IMAGING — CT CT ABD-PELV W/ CM
4 of 5 series · 10 of 46 positions shown, 14 images · IV contrast (APPLIED)
Comparison: None.

CLINICAL DATA: Metastatic head and neck carcinoma

EXAM:
CT CHEST, ABDOMEN, AND PELVIS WITH CONTRAST
TECHNIQUE: Multidetector CT imaging of the chest, abdomen and pelvis was
performed following the standard protocol during bolus
administration of intravenous contrast.
CONTRAST:  100mL OMNIPAQUE IOHEXOL 300 MG/ML  SOLN

[Series 2: chest/abd/pel 5.0 b31f · axial · 0.70mm/px · z∈[+753,+913]mm · 3 of 138 slices shown]
[im 17/138  soft-tissue]
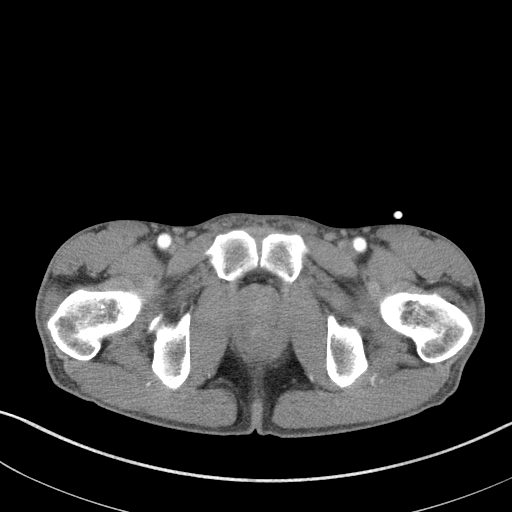
[im 33/138  soft-tissue]
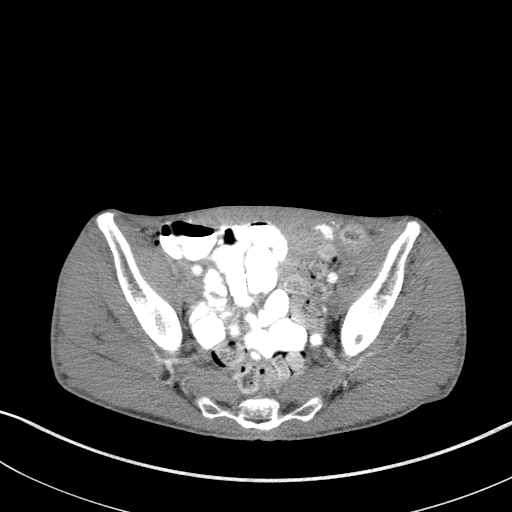
[im 49/138  soft-tissue]
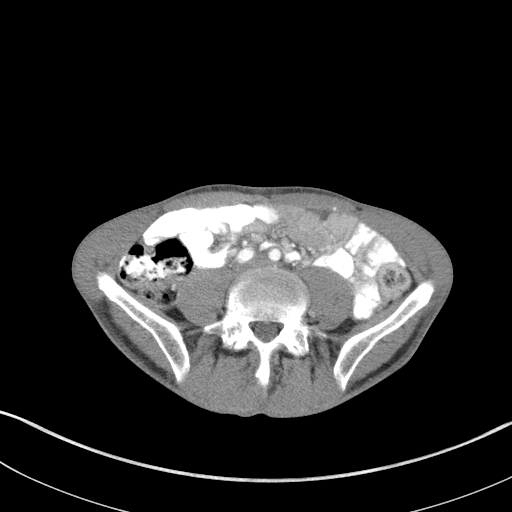

[Series 5: chest/abd/pel 3.0 coronal · coronal · 0.79mm/px · 3 of 72 slices shown]
[im 24/72  soft-tissue]
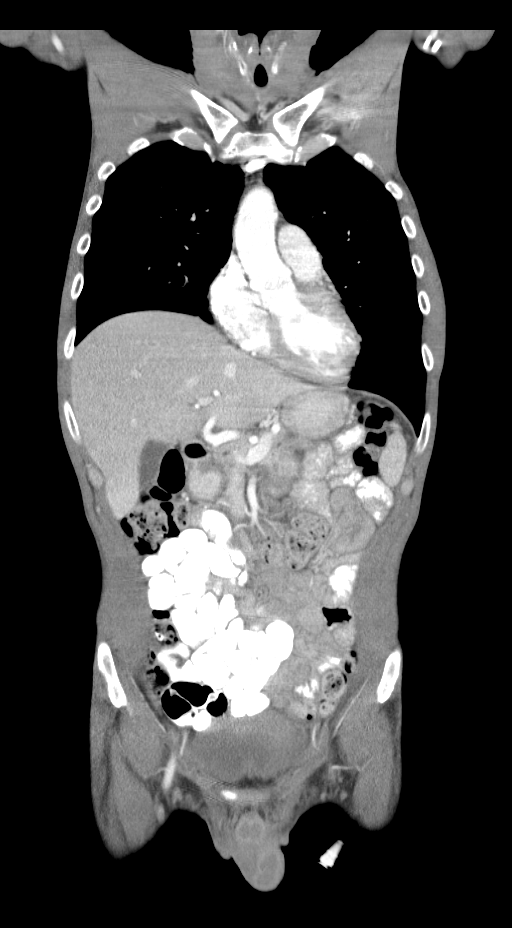
[im 32/72  soft-tissue]
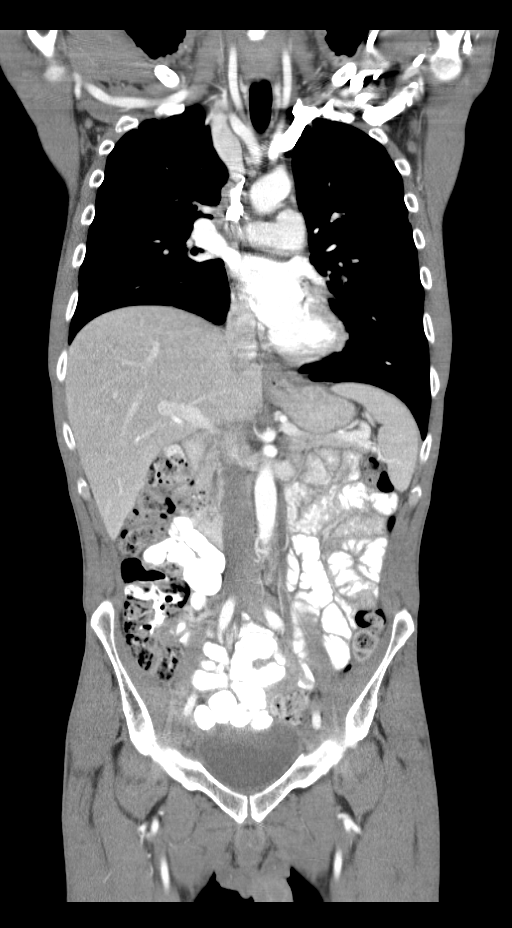
[im 40/72  soft-tissue]
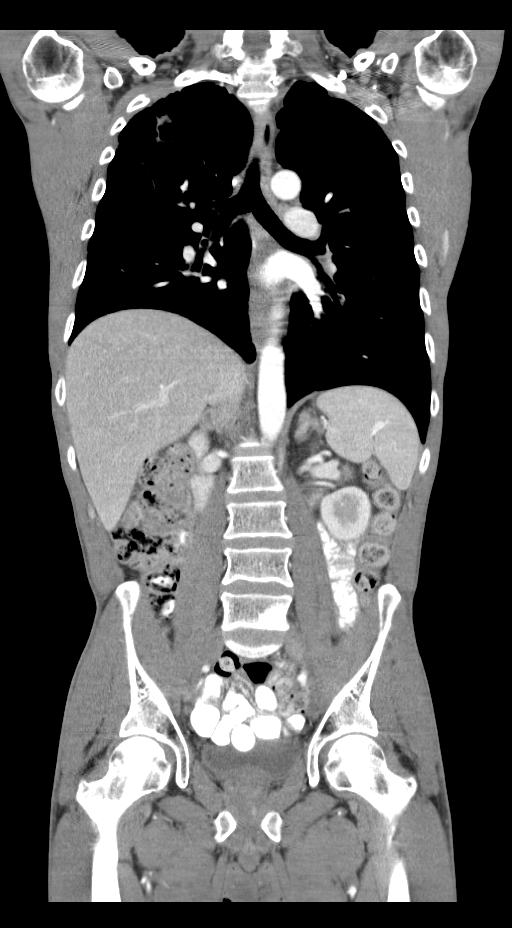

[Series 6: chest/abd/pel 3.0 sagittal · sagittal · 0.53mm/px · 1 of 116 slices shown]
[im 39/116  soft-tissue]
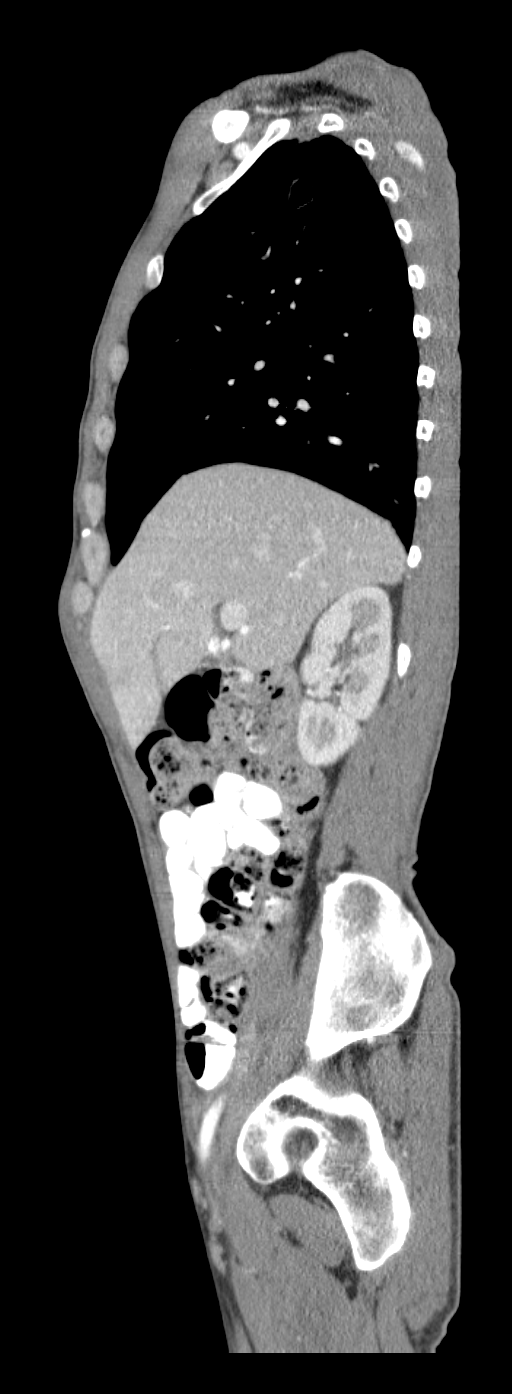

[Series 7: renal delay 5.0 b30f · axial · delayed · 0.63mm/px · z∈[+971,+1056]mm · 3 of 35 slices shown, 7 images]
[im 9/35  soft-tissue]
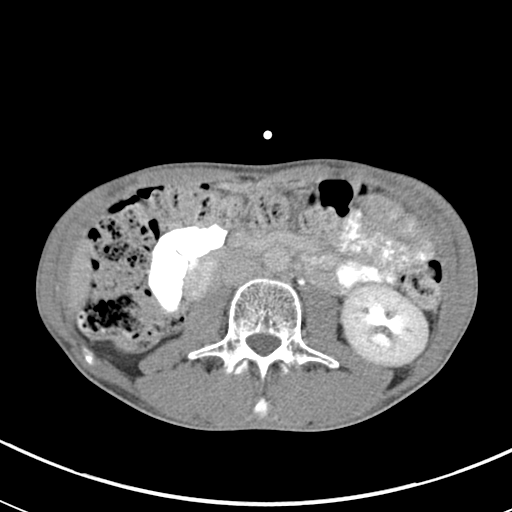
[im 9/35  lung]
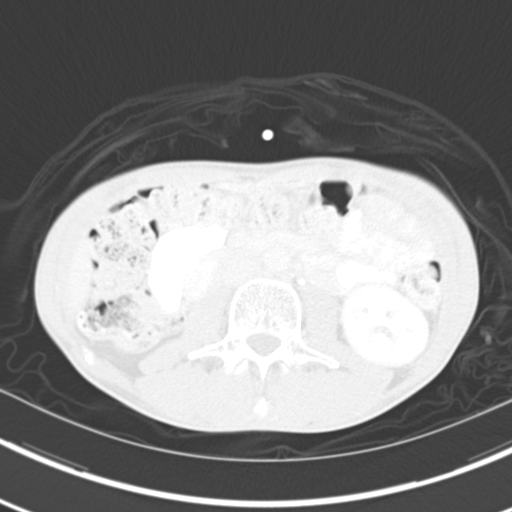
[im 9/35  bone]
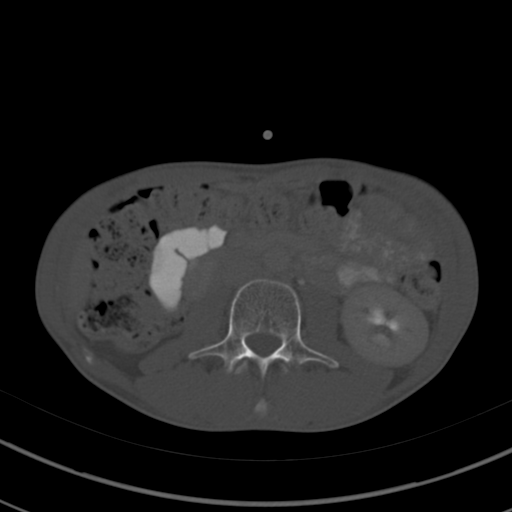
[im 18/35  soft-tissue]
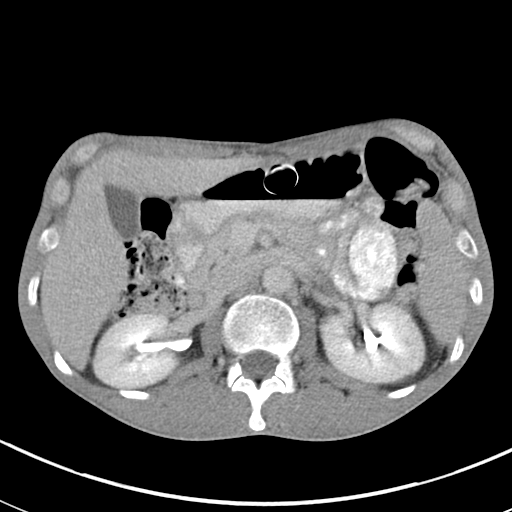
[im 18/35  lung]
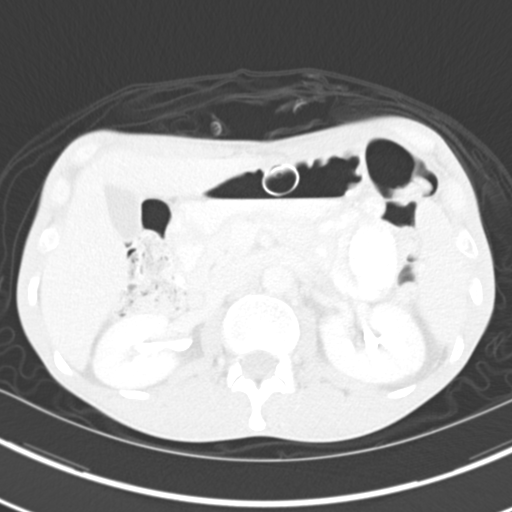
[im 26/35  soft-tissue]
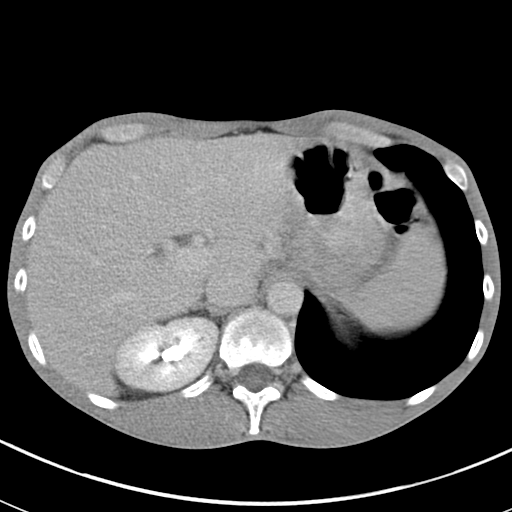
[im 26/35  lung]
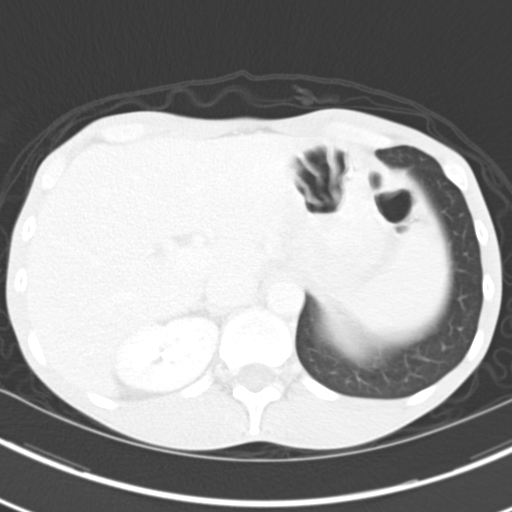

[10 of 46 positions shown; findings below may reference images not displayed]

FINDINGS: CT CHEST FINDINGS

Mediastinum/Nodes: Normal heart size. No pericardial effusion. The
trachea appears patent and is midline. Normal appearance of the
esophagus. Interval enlargement of left hilar lymph node which
measures 1.5 cm, image 38 of series 2. Previous 5 mm. No axillary or
supraclavicular adenopathy.

Lungs/Pleura: No pleural fluid identified. Moderate changes of
centrilobular emphysema. Cavitary lesion within the right apex
measures 2.8 cm, image 16 of series 4. Previously 2.5 cm, image 16
of series 4. Peripheral right upper lobe lung nodule measures 3 mm
and is stable from previous exam. Cystic structure within the right
upper lobe measures 1.9 cm, image 26 of series 4. Previously 2.7 cm.
Within the left upper lobe there is a sub solid, subpleural nodule
measuring 8 mm, image 30 of series 4. This is stable from previous
exam. Small left lower lobe lung nodule measure 4 mm and is
unchanged from previous exam, image 56 of series 4.

Musculoskeletal: No aggressive lytic or sclerotic bone lesions.

CT ABDOMEN PELVIS FINDINGS

Hepatobiliary: Normal appearance of the liver. The gallbladder is
normal. No biliary dilatation.

Pancreas: Unremarkable appearance of the pancreas.

Spleen: Spleen is negative.

Adrenals/Urinary Tract: The adrenal glands are normal. The kidneys
are both normal. The urinary bladder appears normal.

Stomach/Bowel: There is a gastrostomy tube within the stomach. No
pathologic dilatation of the small or large bowel loops.

Vascular/Lymphatic: The abdominal aorta appears normal. No aneurysm.
No upper abdominal adenopathy. There is no pelvic or inguinal
adenopathy.

Reproductive: The prostate gland and seminal vesicles appear normal.

Other: No free fluid or fluid collections.

Musculoskeletal: No aggressive lytic lesion involving the L5
vertebra is again noted and appears unchanged measuring 1.3 cm.
IMPRESSION: 1. Since the previous exam there has been interval enlargement of
left hilar lymph node. Cannot rule out recurrent metastatic
adenopathy.
2. Cystic lesions within the right upper lobe are stable compared
with previous exam. No new or progressive disease identified within
the lungs.
3. Emphysema
4. Stable L5 lytic lesion.  No new areas of bone metastases.

## 2016-05-30 ENCOUNTER — Other Ambulatory Visit: Payer: Self-pay | Admitting: Nurse Practitioner
